# Patient Record
Sex: Female | Born: 1935 | Race: Black or African American | Hispanic: No | Marital: Single | Smoking: Former smoker
Health system: Southern US, Community
[De-identification: ages and names within clinical notes are randomized; demographics above are authoritative.]

## PROBLEM LIST (undated history)

## (undated) DIAGNOSIS — H9190 Unspecified hearing loss, unspecified ear: Secondary | ICD-10-CM

## (undated) DIAGNOSIS — N189 Chronic kidney disease, unspecified: Secondary | ICD-10-CM

## (undated) DIAGNOSIS — E785 Hyperlipidemia, unspecified: Secondary | ICD-10-CM

## (undated) DIAGNOSIS — I10 Essential (primary) hypertension: Secondary | ICD-10-CM

## (undated) DIAGNOSIS — M1990 Unspecified osteoarthritis, unspecified site: Secondary | ICD-10-CM

## (undated) DIAGNOSIS — A539 Syphilis, unspecified: Secondary | ICD-10-CM

## (undated) DIAGNOSIS — D649 Anemia, unspecified: Secondary | ICD-10-CM

## (undated) HISTORY — PX: OOPHORECTOMY: SHX86

## (undated) HISTORY — DX: Unspecified hearing loss, unspecified ear: H91.90

## (undated) HISTORY — DX: Syphilis, unspecified: A53.9

## (undated) HISTORY — DX: Chronic kidney disease, unspecified: N18.9

## (undated) HISTORY — DX: Anemia, unspecified: D64.9

## (undated) HISTORY — PX: NEPHRECTOMY: SHX65

## (undated) HISTORY — DX: Hyperlipidemia, unspecified: E78.5

## (undated) HISTORY — PX: ABDOMINAL HYSTERECTOMY: SHX81

## (undated) HISTORY — DX: Unspecified osteoarthritis, unspecified site: M19.90

## (undated) HISTORY — PX: CATARACT EXTRACTION: SUR2

---

## 1998-08-07 ENCOUNTER — Encounter: Admission: RE | Admit: 1998-08-07 | Discharge: 1998-11-05 | Payer: Self-pay | Admitting: Internal Medicine

## 2000-04-11 HISTORY — PX: OTHER SURGICAL HISTORY: SHX169

## 2000-07-14 ENCOUNTER — Encounter: Admission: RE | Admit: 2000-07-14 | Discharge: 2000-10-12 | Payer: Self-pay | Admitting: Internal Medicine

## 2003-08-29 ENCOUNTER — Emergency Department (HOSPITAL_COMMUNITY): Admission: EM | Admit: 2003-08-29 | Discharge: 2003-08-29 | Payer: Self-pay | Admitting: Emergency Medicine

## 2004-07-14 ENCOUNTER — Ambulatory Visit: Payer: Self-pay | Admitting: Internal Medicine

## 2004-08-13 ENCOUNTER — Ambulatory Visit: Payer: Self-pay | Admitting: Internal Medicine

## 2004-08-19 ENCOUNTER — Ambulatory Visit: Payer: Self-pay | Admitting: Internal Medicine

## 2004-09-09 ENCOUNTER — Ambulatory Visit: Payer: Self-pay | Admitting: Internal Medicine

## 2004-12-10 ENCOUNTER — Ambulatory Visit: Payer: Self-pay | Admitting: Internal Medicine

## 2005-04-21 ENCOUNTER — Ambulatory Visit: Payer: Self-pay | Admitting: Internal Medicine

## 2005-05-12 ENCOUNTER — Ambulatory Visit: Payer: Self-pay | Admitting: Internal Medicine

## 2005-07-01 ENCOUNTER — Encounter: Admission: RE | Admit: 2005-07-01 | Discharge: 2005-07-01 | Payer: Self-pay | Admitting: Internal Medicine

## 2005-07-05 ENCOUNTER — Encounter: Payer: Self-pay | Admitting: Internal Medicine

## 2005-08-09 ENCOUNTER — Ambulatory Visit: Payer: Self-pay | Admitting: Internal Medicine

## 2005-11-09 ENCOUNTER — Ambulatory Visit: Payer: Self-pay | Admitting: Internal Medicine

## 2006-03-09 ENCOUNTER — Ambulatory Visit: Payer: Self-pay | Admitting: Internal Medicine

## 2006-03-09 LAB — CONVERTED CEMR LAB
ALT: 13 units/L (ref 0–40)
AST: 19 units/L (ref 0–37)
BUN: 32 mg/dL — ABNORMAL HIGH (ref 6–23)
CO2: 26 meq/L (ref 19–32)
Calcium: 8.9 mg/dL (ref 8.4–10.5)
Chloride: 105 meq/L (ref 96–112)
Creatinine, Ser: 2.5 mg/dL — ABNORMAL HIGH (ref 0.4–1.2)
Ferritin: 29.7 ng/mL (ref 10.0–291.0)
GFR calc non Af Amer: 20 mL/min
Glomerular Filtration Rate, Af Am: 24 mL/min/{1.73_m2}
Glucose, Bld: 109 mg/dL — ABNORMAL HIGH (ref 70–99)
HCT: 25.5 % — ABNORMAL LOW (ref 36.0–46.0)
Hemoglobin: 8.3 g/dL — ABNORMAL LOW (ref 12.0–15.0)
Hgb A1c MFr Bld: 7.4 % — ABNORMAL HIGH (ref 4.6–6.0)
Iron: 53 ug/dL (ref 42–145)
MCHC: 32.7 g/dL (ref 30.0–36.0)
MCV: 86.3 fL (ref 78.0–100.0)
Platelets: 304 10*3/uL (ref 150–400)
Potassium: 3.9 meq/L (ref 3.5–5.1)
RBC: 2.95 M/uL — ABNORMAL LOW (ref 3.87–5.11)
RDW: 13.3 % (ref 11.5–14.6)
Sodium: 139 meq/L (ref 135–145)
WBC: 3.5 10*3/uL — ABNORMAL LOW (ref 4.5–10.5)

## 2006-06-26 ENCOUNTER — Ambulatory Visit: Payer: Self-pay | Admitting: Internal Medicine

## 2006-06-26 LAB — CONVERTED CEMR LAB
BUN: 36 mg/dL — ABNORMAL HIGH (ref 6–23)
Basophils Absolute: 0 10*3/uL (ref 0.0–0.1)
Basophils Relative: 0.6 % (ref 0.0–1.0)
CO2: 25 meq/L (ref 19–32)
Calcium: 8.8 mg/dL (ref 8.4–10.5)
Chloride: 109 meq/L (ref 96–112)
Cholesterol: 131 mg/dL (ref 0–200)
Creatinine, Ser: 2.5 mg/dL — ABNORMAL HIGH (ref 0.4–1.2)
Eosinophils Absolute: 0.1 10*3/uL (ref 0.0–0.6)
Eosinophils Relative: 2.5 % (ref 0.0–5.0)
GFR calc Af Amer: 24 mL/min
GFR calc non Af Amer: 20 mL/min
Glucose, Bld: 59 mg/dL — ABNORMAL LOW (ref 70–99)
HCT: 24.8 % — ABNORMAL LOW (ref 36.0–46.0)
HDL: 70.8 mg/dL (ref >39.0)
Hemoglobin: 8.2 g/dL — ABNORMAL LOW (ref 12.0–15.0)
Hgb A1c MFr Bld: 7.2 % — ABNORMAL HIGH (ref 4.6–6.0)
LDL Cholesterol: 50 mg/dL (ref 0–99)
Lymphocytes Relative: 26.7 % (ref 12.0–46.0)
MCHC: 33 g/dL (ref 30.0–36.0)
MCV: 84.9 fL (ref 78.0–100.0)
Monocytes Absolute: 0.5 10*3/uL (ref 0.2–0.7)
Monocytes Relative: 14.9 % — ABNORMAL HIGH (ref 3.0–11.0)
Neutro Abs: 1.9 10*3/uL (ref 1.4–7.7)
Neutrophils Relative %: 55.3 % (ref 43.0–77.0)
Platelets: 280 10*3/uL (ref 150–400)
Potassium: 5.3 meq/L — ABNORMAL HIGH (ref 3.5–5.1)
RBC: 2.92 M/uL — ABNORMAL LOW (ref 3.87–5.11)
RDW: 12.9 % (ref 11.5–14.6)
Sodium: 141 meq/L (ref 135–145)
Total CHOL/HDL Ratio: 1.9
Triglycerides: 52 mg/dL (ref 0–149)
VLDL: 10 mg/dL (ref 0–40)
WBC: 3.4 10*3/uL — ABNORMAL LOW (ref 4.5–10.5)

## 2006-08-17 DIAGNOSIS — E119 Type 2 diabetes mellitus without complications: Secondary | ICD-10-CM

## 2006-08-17 DIAGNOSIS — D649 Anemia, unspecified: Secondary | ICD-10-CM

## 2006-08-17 DIAGNOSIS — E785 Hyperlipidemia, unspecified: Secondary | ICD-10-CM

## 2006-09-22 ENCOUNTER — Ambulatory Visit: Payer: Self-pay | Admitting: Vascular Surgery

## 2006-09-22 ENCOUNTER — Emergency Department (HOSPITAL_COMMUNITY): Admission: EM | Admit: 2006-09-22 | Discharge: 2006-09-22 | Payer: Self-pay | Admitting: Emergency Medicine

## 2006-09-22 ENCOUNTER — Telehealth: Payer: Self-pay | Admitting: Internal Medicine

## 2006-09-29 ENCOUNTER — Telehealth: Payer: Self-pay | Admitting: Internal Medicine

## 2006-09-29 ENCOUNTER — Encounter: Payer: Self-pay | Admitting: Internal Medicine

## 2006-09-30 ENCOUNTER — Ambulatory Visit: Payer: Self-pay | Admitting: Family Medicine

## 2006-09-30 ENCOUNTER — Encounter: Payer: Self-pay | Admitting: Internal Medicine

## 2006-10-12 ENCOUNTER — Ambulatory Visit: Payer: Self-pay | Admitting: Internal Medicine

## 2006-10-12 DIAGNOSIS — N184 Chronic kidney disease, stage 4 (severe): Secondary | ICD-10-CM | POA: Insufficient documentation

## 2006-10-15 LAB — CONVERTED CEMR LAB
BUN: 31 mg/dL — ABNORMAL HIGH (ref 6–23)
Basophils Absolute: 0 10*3/uL (ref 0.0–0.1)
Basophils Relative: 0.3 % (ref 0.0–1.0)
CO2: 24 meq/L (ref 19–32)
Calcium: 8.7 mg/dL (ref 8.4–10.5)
Chloride: 108 meq/L (ref 96–112)
Creatinine, Ser: 2.3 mg/dL — ABNORMAL HIGH (ref 0.4–1.2)
Eosinophils Absolute: 0.1 10*3/uL (ref 0.0–0.6)
Eosinophils Relative: 1.2 % (ref 0.0–5.0)
GFR calc Af Amer: 26 mL/min
GFR calc non Af Amer: 22 mL/min
Glucose, Bld: 75 mg/dL (ref 70–99)
HCT: 24.3 % — ABNORMAL LOW (ref 36.0–46.0)
Hemoglobin: 7.9 g/dL — ABNORMAL LOW (ref 12.0–15.0)
Lymphocytes Relative: 26.6 % (ref 12.0–46.0)
MCHC: 32.6 g/dL (ref 30.0–36.0)
MCV: 84.6 fL (ref 78.0–100.0)
Monocytes Absolute: 0.6 10*3/uL (ref 0.2–0.7)
Monocytes Relative: 10 % (ref 3.0–11.0)
Neutro Abs: 3.7 10*3/uL (ref 1.4–7.7)
Neutrophils Relative %: 61.9 % (ref 43.0–77.0)
Platelets: 274 10*3/uL (ref 150–400)
Potassium: 5 meq/L (ref 3.5–5.1)
RBC: 2.88 M/uL — ABNORMAL LOW (ref 3.87–5.11)
RDW: 13.5 % (ref 11.5–14.6)
Sodium: 140 meq/L (ref 135–145)
WBC: 6 10*3/uL (ref 4.5–10.5)

## 2006-10-20 ENCOUNTER — Telehealth (INDEPENDENT_AMBULATORY_CARE_PROVIDER_SITE_OTHER): Payer: Self-pay | Admitting: *Deleted

## 2006-10-20 ENCOUNTER — Encounter (INDEPENDENT_AMBULATORY_CARE_PROVIDER_SITE_OTHER): Payer: Self-pay | Admitting: *Deleted

## 2007-02-09 ENCOUNTER — Encounter (INDEPENDENT_AMBULATORY_CARE_PROVIDER_SITE_OTHER): Payer: Self-pay | Admitting: *Deleted

## 2007-03-16 ENCOUNTER — Ambulatory Visit: Payer: Self-pay | Admitting: Internal Medicine

## 2007-03-29 LAB — CONVERTED CEMR LAB
GFR calc Af Amer: 23 mL/min
GFR calc non Af Amer: 19 mL/min
Glucose, Bld: 262 mg/dL — ABNORMAL HIGH (ref 70–99)
Hemoglobin: 9.1 g/dL — ABNORMAL LOW (ref 12.0–15.0)
Iron: 77 ug/dL (ref 42–145)
Sodium: 137 meq/L (ref 135–145)

## 2007-05-04 ENCOUNTER — Ambulatory Visit: Payer: Self-pay | Admitting: Internal Medicine

## 2007-05-08 LAB — CONVERTED CEMR LAB
Calcium: 9 mg/dL (ref 8.4–10.5)
Chloride: 107 meq/L (ref 96–112)
GFR calc non Af Amer: 22 mL/min
Sodium: 139 meq/L (ref 135–145)

## 2007-08-27 ENCOUNTER — Encounter (INDEPENDENT_AMBULATORY_CARE_PROVIDER_SITE_OTHER): Payer: Self-pay | Admitting: *Deleted

## 2007-09-12 ENCOUNTER — Telehealth (INDEPENDENT_AMBULATORY_CARE_PROVIDER_SITE_OTHER): Payer: Self-pay | Admitting: *Deleted

## 2007-11-23 ENCOUNTER — Ambulatory Visit: Payer: Self-pay | Admitting: Internal Medicine

## 2007-12-07 ENCOUNTER — Telehealth (INDEPENDENT_AMBULATORY_CARE_PROVIDER_SITE_OTHER): Payer: Self-pay | Admitting: *Deleted

## 2007-12-07 LAB — CONVERTED CEMR LAB
ALT: 10 units/L (ref 0–35)
AST: 17 units/L (ref 0–37)
BUN: 30 mg/dL — ABNORMAL HIGH (ref 6–23)
Cholesterol: 150 mg/dL (ref 0–200)
GFR calc Af Amer: 29 mL/min
Glucose, Bld: 197 mg/dL — ABNORMAL HIGH (ref 70–99)
HDL: 83.6 mg/dL (ref >39.0)
Hemoglobin: 9.8 g/dL — ABNORMAL LOW (ref 12.0–15.0)
Microalb Creat Ratio: 286.7 mg/g — ABNORMAL HIGH (ref 0.0–30.0)
Potassium: 4.7 meq/L (ref 3.5–5.1)
VLDL: 14 mg/dL (ref 0–40)

## 2008-01-25 ENCOUNTER — Telehealth (INDEPENDENT_AMBULATORY_CARE_PROVIDER_SITE_OTHER): Payer: Self-pay | Admitting: *Deleted

## 2008-02-18 ENCOUNTER — Ambulatory Visit: Payer: Self-pay | Admitting: Internal Medicine

## 2008-02-18 DIAGNOSIS — M1990 Unspecified osteoarthritis, unspecified site: Secondary | ICD-10-CM | POA: Insufficient documentation

## 2008-02-26 ENCOUNTER — Telehealth: Payer: Self-pay | Admitting: Internal Medicine

## 2008-06-20 ENCOUNTER — Ambulatory Visit: Payer: Self-pay | Admitting: Internal Medicine

## 2008-06-21 LAB — CONVERTED CEMR LAB
Calcium: 8.9 mg/dL (ref 8.4–10.5)
Chloride: 105 meq/L (ref 96–112)
Creatinine, Ser: 2.4 mg/dL — ABNORMAL HIGH (ref 0.4–1.2)
GFR calc Af Amer: 25 mL/min
GFR calc non Af Amer: 21 mL/min
Hgb A1c MFr Bld: 7.8 % — ABNORMAL HIGH (ref 4.6–6.0)

## 2008-06-24 ENCOUNTER — Telehealth (INDEPENDENT_AMBULATORY_CARE_PROVIDER_SITE_OTHER): Payer: Self-pay | Admitting: *Deleted

## 2008-11-28 ENCOUNTER — Ambulatory Visit: Payer: Self-pay | Admitting: Internal Medicine

## 2008-12-04 ENCOUNTER — Encounter (INDEPENDENT_AMBULATORY_CARE_PROVIDER_SITE_OTHER): Payer: Self-pay | Admitting: *Deleted

## 2008-12-04 LAB — CONVERTED CEMR LAB
BUN: 24 mg/dL — ABNORMAL HIGH (ref 6–23)
Calcium: 8.9 mg/dL (ref 8.4–10.5)
Creatinine, Ser: 1.9 mg/dL — ABNORMAL HIGH (ref 0.4–1.2)
GFR calc non Af Amer: 32.44 mL/min (ref >60)
Potassium: 4.7 meq/L (ref 3.5–5.1)
TSH: 1.85 microintl units/mL (ref 0.35–5.50)

## 2009-02-03 ENCOUNTER — Encounter (INDEPENDENT_AMBULATORY_CARE_PROVIDER_SITE_OTHER): Payer: Self-pay | Admitting: *Deleted

## 2009-03-12 ENCOUNTER — Encounter (INDEPENDENT_AMBULATORY_CARE_PROVIDER_SITE_OTHER): Payer: Self-pay | Admitting: *Deleted

## 2009-04-15 ENCOUNTER — Ambulatory Visit: Payer: Self-pay | Admitting: Internal Medicine

## 2009-04-20 ENCOUNTER — Encounter (INDEPENDENT_AMBULATORY_CARE_PROVIDER_SITE_OTHER): Payer: Self-pay | Admitting: *Deleted

## 2009-04-20 LAB — CONVERTED CEMR LAB
HDL: 93 mg/dL (ref >39.00)
LDL Cholesterol: 85 mg/dL (ref 0–99)
Total CHOL/HDL Ratio: 2
Triglycerides: 91 mg/dL (ref 0.0–149.0)
VLDL: 18.2 mg/dL (ref 0.0–40.0)

## 2009-08-13 ENCOUNTER — Ambulatory Visit: Payer: Self-pay | Admitting: Internal Medicine

## 2009-08-17 ENCOUNTER — Telehealth (INDEPENDENT_AMBULATORY_CARE_PROVIDER_SITE_OTHER): Payer: Self-pay | Admitting: *Deleted

## 2009-08-17 LAB — CONVERTED CEMR LAB
BUN: 38 mg/dL — ABNORMAL HIGH (ref 6–23)
Basophils Relative: 0.9 % (ref 0.0–3.0)
Creatinine, Ser: 2.3 mg/dL — ABNORMAL HIGH (ref 0.4–1.2)
Eosinophils Absolute: 0.1 10*3/uL (ref 0.0–0.7)
Eosinophils Relative: 2 % (ref 0.0–5.0)
GFR calc non Af Amer: 26.65 mL/min (ref >60)
Lymphocytes Relative: 25.4 % (ref 12.0–46.0)
Monocytes Absolute: 0.7 10*3/uL (ref 0.1–1.0)
Neutrophils Relative %: 60.1 % (ref 43.0–77.0)
Platelets: 315 10*3/uL (ref 150.0–400.0)
RBC: 3.62 M/uL — ABNORMAL LOW (ref 3.87–5.11)
Sodium: 141 meq/L (ref 135–145)
WBC: 5.8 10*3/uL (ref 4.5–10.5)

## 2009-08-25 ENCOUNTER — Encounter (INDEPENDENT_AMBULATORY_CARE_PROVIDER_SITE_OTHER): Payer: Self-pay | Admitting: *Deleted

## 2009-12-16 ENCOUNTER — Ambulatory Visit: Payer: Self-pay | Admitting: Internal Medicine

## 2009-12-18 LAB — CONVERTED CEMR LAB
ALT: 12 units/L (ref 0–35)
Calcium: 8.9 mg/dL (ref 8.4–10.5)
GFR calc non Af Amer: 28.67 mL/min (ref >60)
Glucose, Bld: 153 mg/dL — ABNORMAL HIGH (ref 70–99)
Hgb A1c MFr Bld: 9 % — ABNORMAL HIGH (ref 4.6–6.5)
Potassium: 5 meq/L (ref 3.5–5.1)
Sodium: 141 meq/L (ref 135–145)

## 2009-12-23 ENCOUNTER — Telehealth (INDEPENDENT_AMBULATORY_CARE_PROVIDER_SITE_OTHER): Payer: Self-pay | Admitting: *Deleted

## 2010-01-11 ENCOUNTER — Encounter: Payer: Self-pay | Admitting: Internal Medicine

## 2010-04-16 ENCOUNTER — Ambulatory Visit
Admission: RE | Admit: 2010-04-16 | Discharge: 2010-04-16 | Payer: Self-pay | Source: Home / Self Care | Attending: Internal Medicine | Admitting: Internal Medicine

## 2010-04-16 ENCOUNTER — Other Ambulatory Visit: Payer: Self-pay | Admitting: Internal Medicine

## 2010-04-16 LAB — LIPID PANEL
Cholesterol: 174 mg/dL (ref 0–200)
HDL: 73.3 mg/dL (ref >39.00)
LDL Cholesterol: 78 mg/dL (ref 0–99)
Total CHOL/HDL Ratio: 2
Triglycerides: 116 mg/dL (ref 0.0–149.0)
VLDL: 23.2 mg/dL (ref 0.0–40.0)

## 2010-04-16 LAB — MICROALBUMIN / CREATININE URINE RATIO
Creatinine,U: 78.7 mg/dL
Microalb Creat Ratio: 9.4 mg/g (ref 0.0–30.0)
Microalb, Ur: 7.4 mg/dL — ABNORMAL HIGH (ref 0.0–1.9)

## 2010-04-16 LAB — HEMOGLOBIN: Hemoglobin: 10.6 g/dL — ABNORMAL LOW (ref 12.0–15.0)

## 2010-04-16 LAB — HEMOGLOBIN A1C: Hgb A1c MFr Bld: 8.7 % — ABNORMAL HIGH (ref 4.6–6.5)

## 2010-04-27 ENCOUNTER — Telehealth: Payer: Self-pay | Admitting: Internal Medicine

## 2010-05-11 NOTE — Progress Notes (Signed)
  Phone Note Call from Patient   Summary of Call: Needs Test Strips and Lancets sent in for OneTouch machine.     New/Updated Medications: ONETOUCH ULTRA BLUE  STRP (GLUCOSE BLOOD) as directed. ONETOUCH ULTRASOFT LANCETS  MISC (LANCETS) as directed. Prescriptions: ONETOUCH ULTRASOFT LANCETS  MISC (LANCETS) as directed.  #100 x 1   Entered by:   Army Fossa CMA   Authorized by:   Nolon Rod. Paz MD   Signed by:   Army Fossa CMA on 12/23/2009   Method used:   [facility]        [facility] [address] [phone]* (retail)       [address]       [place], [place]  [address]       Ph: [phone] or [phone]       Fax: [phone]   RxID:   [id number] ONETOUCH ULTRA BLUE  STRP (GLUCOSE BLOOD) as directed.  #100 x 0   Entered by:   Army Fossa CMA   Authorized by:   Nolon Rod. Paz MD   Signed by:   Army Fossa CMA on 12/23/2009   Method used:   [facility]        [facility] [address] [phone]* (retail)       [address]       [place], [place]  [address]       Ph: [phone] or [phone]       Fax: [phone]   RxID:   [id number]

## 2010-05-11 NOTE — Progress Notes (Signed)
SummaryMarita Barker, results, 5/9, 5/12, 5/17  Phone Note Outgoing Call Call back at [name] Phone [phone]   Call placed by: Shary Decamp,  Aug 17, 2009 4:55 PM Call placed to: Patient Reason for Call: Discuss lab or test results Details for Reason:  - renal fx - stable  - anemia - stable  - DM needs better control - goal <8  - increase glimepiride 2mg  to 1 1/2 tab daily  - watch for low blood sugar sxs  - f/u in 4 months  Summary of Call: left message on machine for daughter to return call.............Marland KitchenShary Decamp  Aug 17, 2009 4:56 PM left message on machine for daughter to return call........Marland KitchenShary Decamp  Aug 20, 2009 11:59 AM left message on machine for daughter to return call.......Marland KitchenShary Decamp  Aug 25, 2009 2:34 PM letter mailed........Marland KitchenShary Decamp  Aug 25, 2009 2:35 PM      New/Updated Medications: GLIMEPIRIDE 2 MG TABS (GLIMEPIRIDE) 1 1/2 by mouth once daily Prescriptions: GLIMEPIRIDE 2 MG TABS (GLIMEPIRIDE) 1 1/2 by mouth once daily  #45 x 3   Entered by:   Shary Decamp   Authorized by:   Nolon Rod. Paz MD   Signed by:   Shary Decamp on 08/17/2009   Method used:   Electronically to        [facility] [address] [phone]* (retail)       [address]       [place], [place]  [address]       Ph: [phone] or [phone]       Fax: [phone]   RxID:   [id number]

## 2010-05-11 NOTE — Letter (Signed)
Summary: LMOM, results, 5/9, 5/12, 5/17   [facility] [place] [address] [place], [place]  [address] Phone: [phone]      Aug 25, 2009   [facility] [address] [place], [place] [address]  RE:  LAB RESULTS  Dear  Ms. Meiklejohn,  The following is an interpretation of your most recent lab tests.  Please take note of any instructions provided or changes to medications that have resulted from your lab work.  ELECTROLYTES:  Stable - no changes needed  KIDNEY FUNCTION TESTS:  Stable - no changes needed    DIABETIC STUDIES:  Please see the enclosed Rx for a change in medication Blood Glucose: 188   HgbA1C: 8.8   Microalbumin/Creatinine Ratio: 286.7     Diabetes needs better control.  Please increase glimpiride to 1 1/2 tablet daily ([facility] [address]).  Watch for low blood sugar symptoms.  Follow up with Dr. Drue Novel in 4 months.  Please call our office if you have any questions. Alena Bills

## 2010-05-11 NOTE — Assessment & Plan Note (Signed)
Summary: FOLLOWUP PER LETTER////SPH   Vital Signs:  Patient profile:   77 year old female Weight:      163.8 pounds Pulse rate:   80 / minute BP sitting:   120 / 80  Vitals Entered By: Shary Decamp (April 15, 2009 11:34 AM) CC: rov   History of Present Illness: here w/ a daughter for f/u "I feel terrific"   Current Medications (verified): 1)  Clonidine Hcl 0.1 Mg Tabs (Clonidine Hcl) .... Take 1 Tablet By Mouth Twice A Day 2)  Furosemide 20 Mg Tabs (Furosemide) .... Take 2 Tablet By Mouth Once A Day- 3)  Vitamin B12 4)  Asa 5)  Glimepiride 2 Mg Tabs (Glimepiride) .Marland Kitchen.. 1 By Mouth Once Daily 6)  Januvia 50 Mg Tabs (Sitagliptin Phosphate) .Marland Kitchen.. 1 By Mouth Once Daily 7)  Simvastatin 40 Mg Tabs (Simvastatin) .Marland Kitchen.. 1 A Day  Allergies (verified): 1)  ! Lisinopril 2)  ! 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  syphillis, treated c/ PCN 1993 Osteoarthritis  Social History: Reviewed history from 06/20/2008 and no changes required. lives in her house w/ a son still works 4h a day (cleaning)  Review of Systems       Diabetes -- CBGs in the 105 range when checked  Hyperlipidemia-- good medication compliance   denies  s/e from medications denies runny nose (see last OV) CV:  Denies chest pain or discomfort, shortness of breath with exertion, and swelling of feet.  Physical Exam  General:  alert and well-developed.   Lungs:  normal respiratory effort, no intercostal retractions, no accessory muscle use, and normal breath sounds.   Heart:  normal rate, regular rhythm, and no murmur.   Extremities:  no pretibial edema bilaterally  R knee on a brace, no edema or warmness   Impression & Recommendations:  Problem # 1:  HYPERLIPIDEMIA (ICD-272.4) tolerating meds well  Her updated medication list for this problem  includes:    Simvastatin 40 Mg Tabs (Simvastatin) .Marland Kitchen... 1 a day  Orders: TLB-ALT (SGPT) (84460-ALT) TLB-AST (SGOT) (84450-SGOT) TLB-Lipid Panel (80061-LIPID)  Labs Reviewed: SGOT: 17 (11/23/2007)   SGPT: 10 (11/23/2007)   HDL:83.6 (11/23/2007), 70.8 (06/26/2006)  LDL:52 (11/23/2007), 50 (06/26/2006)  Chol:150 (11/23/2007), 131 (06/26/2006)  Trig:72 (11/23/2007), 52 (06/26/2006)  Problem # 2:  DIABETES MELLITUS, TYPE II (ICD-250.00) on meds , doing well labs  not on ACEs despite (+) microalb,h/o hyperkalemia w/ lisinopril ----> BP wnl, no change  Her updated medication list for this problem includes:    Glimepiride 2 Mg Tabs (Glimepiride) .Marland Kitchen... 1 by mouth once daily    Januvia 50 Mg Tabs (Sitagliptin phosphate) .Marland Kitchen... 1 by mouth once daily  Orders: Venipuncture ([address]) TLB-ALT (SGPT) (84460-ALT) TLB-AST (SGOT) (84450-SGOT) TLB-A1C / Hgb A1C (Glycohemoglobin) (83036-A1C)  Labs Reviewed: Creat: 1.9 (11/28/2008)    Reviewed HgBA1c results: 7.3 (11/28/2008)  7.8 (06/20/2008)  Complete Medication List: 1)  Clonidine Hcl 0.1 Mg Tabs (Clonidine hcl) .... Take 1 tablet by mouth twice a day 2)  Furosemide 20 Mg Tabs (Furosemide) .... Take 2 tablet by mouth once a day- 3)  Vitamin B12  4)  Asa  5)  Glimepiride 2 Mg Tabs (Glimepiride) .Marland Kitchen.. 1 by mouth once daily 6)  Januvia 50 Mg Tabs (Sitagliptin phosphate) .Marland Kitchen.. 1 by mouth once daily 7)  Simvastatin 40 Mg Tabs (Simvastatin) .Marland Kitchen.. 1 a day  Patient Instructions: 1)  Please  schedule a follow-up appointment in 4 months .  Prescriptions: SIMVASTATIN 40 MG TABS (SIMVASTATIN) 1 a day  #90 x 1   Entered by:   Shary Decamp   Authorized by:   Nolon Rod. Maddox Bratcher MD   Signed by:   Shary Decamp on 04/15/2009   Method used:   Print then Give to Patient   RxID:   [id number] JANUVIA 50 MG TABS (SITAGLIPTIN PHOSPHATE) 1 by mouth once daily  #90 x 1   Entered by:   Shary Decamp   Authorized by:   Nolon Rod. Acheron Sugg MD   Signed by:   Shary Decamp on  04/15/2009   Method used:   Print then Give to Patient   RxID:   [id number] GLIMEPIRIDE 2 MG TABS (GLIMEPIRIDE) 1 by mouth once daily  #90 x 1   Entered by:   Shary Decamp   Authorized by:   Nolon Rod. Tashe Purdon MD   Signed by:   Shary Decamp on 04/15/2009   Method used:   Print then Give to Patient   RxID:   [id number] FUROSEMIDE 20 MG TABS (FUROSEMIDE) Take 2 tablet by mouth once a day-  #180.0 Each x 1   Entered by:   Shary Decamp   Authorized by:   Nolon Rod. Ruey Storer MD   Signed by:   Shary Decamp on 04/15/2009   Method used:   Print then Give to Patient   RxID:   [id number] CLONIDINE HCL 0.1 MG TABS (CLONIDINE HCL) Take 1 tablet by mouth twice a day  #180.0 Each x 1   Entered by:   Shary Decamp   Authorized by:   Nolon Rod. Shakevia Sarris MD   Signed by:   Shary Decamp on 04/15/2009   Method used:   Print then Give to Patient   RxID:   [id number]

## 2010-05-11 NOTE — Assessment & Plan Note (Signed)
Summary: 4 month roa//lch   Vital Signs:  Patient profile:   77 year old female Weight:      154.25 pounds Pulse rate:   65 / minute Pulse rhythm:   regular BP sitting:   130 / 84  (left arm) Cuff size:   large  Vitals Entered By: Army Fossa CMA (December 16, 2009 9:29 AM) CC: Follow up for- fasting Comments Pharm- [facility]   History of Present Illness: ROV chart reviewed in reference to screenings, see below  Diabetes -- no ambulatory CBGs  Hyperlipidemia-- good medication compliance  Osteoarthritis--  R knee stiffnes > pain x a while, patient wonders if related to medicines; denies myalgias perse     Current Medications (verified): 1)  Clonidine Hcl 0.1 Mg Tabs (Clonidine Hcl) .... Take 1 Tablet By Mouth Twice A Day 2)  Furosemide 20 Mg Tabs (Furosemide) .... Take 2 Tablet By Mouth Once A Day- 3)  Vitamin B12 4)  Asa 5)  Glimepiride 2 Mg Tabs (Glimepiride) .Marland Kitchen.. 1 1/2 By Mouth Once Daily 6)  Januvia 50 Mg Tabs (Sitagliptin Phosphate) .Marland Kitchen.. 1 By Mouth Once Daily 7)  Simvastatin 40 Mg Tabs (Simvastatin) .Marland Kitchen.. 1 A Day  Allergies: 1)  ! Lisinopril 2)  ! 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  syphillis, treated c/ PCN 1993 Osteoarthritis  Past Surgical History: Reviewed history from 11/23/2007 and no changes required. 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 Resp:  Denies cough and shortness of breath. GI:  Denies bloody stools, nausea, and vomiting.  Physical Exam  General:  alert and  well-developed.   Lungs:  normal respiratory effort, no intercostal retractions, no accessory muscle use, and normal breath sounds.   Heart:  normal rate, regular rhythm, and no murmur.   Extremities:  no lower extremity edema Knee deformities consistent with DJD No effusion, redness, warmness    Impression & Recommendations:  Problem # 1:  OSTEOARTHRITIS (ICD-715.90) not taking any pain meds at present , rec tylenol, she does not seem to like (?need) any pain med  offered referal to ortho  Her updated medication list for this problem includes:    Tylenol Extra Strength 500 Mg Tabs (Acetaminophen) .Marland Kitchen... 1 or 2 tabs every 6 hours as needed  Problem # 2:  HEALTH SCREENING (ICD-V70.0) TD 03 Pneumonia shot 2003  (per guidelines, no further pneumonia shots) had a flu shot at the pharmacy shingles shot discussed    flex-sig 1997, not interested in a Cscope has consistently refused MMG, refuses again today declined PAPs as well  explained benefits of screening to patient and daughter    Problem # 3:  HYPERLIPIDEMIA (ICD-272.4) cholesterol well controlled, given renal insufficiency, will decrease dose of simvastatin to 20 Her updated medication list for this problem includes:    Simvastatin 40 Mg Tabs (Simvastatin) .Marland Kitchen... 1/2 tablet a day  Labs Reviewed: SGOT: 14 (04/15/2009)   SGPT: 10 (04/15/2009)   HDL:93.00 (04/15/2009), 83.6 (11/23/2007)  LDL:85 (04/15/2009), 52 (11/23/2007)  Chol:196 (04/15/2009), 150 (11/23/2007)  Trig:91.0 (04/15/2009), 72 (11/23/2007)  Orders: TLB-ALT (SGPT) (84460-ALT)  TLB-AST (SGOT) (84450-SGOT) Specimen Handling ([address])  Problem # 4:  DIABETES MELLITUS, TYPE II (ICD-250.00) based on the last A1c, Amaryl  was increased, reports good compliance Her updated medication list for this problem includes:    Glimepiride 2 Mg Tabs (Glimepiride) .Marland Kitchen... 1 1/2 by mouth once daily    Januvia 50 Mg Tabs (Sitagliptin phosphate) .Marland Kitchen... 1 by mouth once daily  Labs  Reviewed: Creat: 2.3 (08/13/2009)    Reviewed HgBA1c results: 8.8 (08/13/2009)  7.8 (04/15/2009)  Orders: TLB-A1C / Hgb A1C (Glycohemoglobin) (83036-A1C) Specimen Handling (98119)  Complete Medication List: 1)  Clonidine Hcl 0.1 Mg Tabs (Clonidine hcl) .... Take 1 tablet by mouth twice a day 2)  Furosemide 20 Mg Tabs (Furosemide) .... Take 2 tablet by mouth once a day- 3)  Vitamin B12  4)  Asa  5)  Glimepiride 2 Mg Tabs (Glimepiride) .Marland Kitchen.. 1 1/2 by mouth once daily 6)  Januvia 50 Mg Tabs (Sitagliptin phosphate) .Marland Kitchen.. 1 by mouth once daily 7)  Simvastatin 40 Mg Tabs (Simvastatin) .... 1/2 tablet a day 8)  Tylenol Extra Strength 500 Mg Tabs (Acetaminophen) .Marland Kitchen.. 1 or 2 tabs every 6 hours as needed  Other Orders: Venipuncture (14782) TLB-BMP (Basic Metabolic Panel-BMET) (80048-METABOL)  Patient Instructions: 1)  Please schedule a follow-up appointment in 4 months .    Influenza Immunization History:    Influenza # 1:  got it elsewhere (12/16/2009)

## 2010-05-11 NOTE — Letter (Signed)
Summary: CMN for Diabetic Supplies / [facility]  CMN for Diabetic Supplies / [facility]   Imported By: Lennie Odor 01/18/2010 14:57:50  _____________________________________________________________________  External Attachment:    Type:   Image     Comment:   External Document

## 2010-05-11 NOTE — Assessment & Plan Note (Signed)
Summary: 4 mth fu/ns/kdc   Vital Signs:  Patient profile:   77 year old female Weight:      161.8 pounds Pulse rate:   86 / minute BP sitting:   120 / 80  Vitals Entered By: Shary Decamp (Aug 13, 2009 9:23 AM) CC: rov, fasting   History of Present Illness: ROV , feels very well , here with one of her daughters   Current Medications (verified): 1)  Clonidine Hcl 0.1 Mg Tabs (Clonidine Hcl) .... Take 1 Tablet By Mouth Twice A Day 2)  Furosemide 20 Mg Tabs (Furosemide) .... Take 2 Tablet By Mouth Once A Day- 3)  Vitamin B12 4)  Asa 5)  Glimepiride 2 Mg Tabs (Glimepiride) .Marland Kitchen.. 1 By Mouth Once Daily 6)  Januvia 50 Mg Tabs (Sitagliptin Phosphate) .Marland Kitchen.. 1 By Mouth Once Daily 7)  Simvastatin 40 Mg Tabs (Simvastatin) .Marland Kitchen.. 1 A Day  Allergies (verified): 1)  ! Lisinopril 2)  ! 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  syphillis, treated c/ PCN 1993 Osteoarthritis  Past Surgical History: Reviewed history from 11/23/2007 and no changes required. rt cochlear implant 1/02 Hysterectomy--BSO Nephrectomy - lt Oophorectomy  Review of Systems       no LE edema (-) CP-SOB (-) N-V Diabetes-- no ambulatory CBGs  Hyperlipidemia-- good medication compliance , no myalgias  Osteoarthritis-- "stiff knee" but not very painful   Physical Exam  General:  alert and well-developed.   Lungs:  normal respiratory effort, no intercostal retractions, no accessory muscle use, and normal breath sounds.   Heart:  normal rate, regular rhythm, and no murmur.   Extremities:  no edema Neurologic:  alert , cooperative oriented to time, space and self   Impression & Recommendations:  Problem # 1:  RENAL INSUFFICIENCY, CHRONIC (ICD-585.9) due for labs Labs Reviewed: BUN: 24 (11/28/2008)   Cr: 1.9 (11/28/2008)    Hgb: 9.5 (11/28/2008)   Hct: 24.3  (10/12/2006)   Ca++: 8.9 (11/28/2008)     Orders: Venipuncture ([address]) TLB-BMP (Basic Metabolic Panel-BMET) (80048-METABOL)  Problem # 2:  DIABETES MELLITUS, TYPE II (ICD-250.00) due for labs not on ACEs despite (+) microalb,h/o hyperkalemia w/ lisinopril ----> BP wnl, no change    Her updated medication list for this problem includes:    Glimepiride 2 Mg Tabs (Glimepiride) .Marland Kitchen... 1 by mouth once daily    Januvia 50 Mg Tabs (Sitagliptin phosphate) .Marland Kitchen... 1 by mouth once daily  Orders: TLB-A1C / Hgb A1C (Glycohemoglobin) (83036-A1C)  Labs Reviewed: Creat: 1.9 (11/28/2008)    Reviewed HgBA1c results: 7.8 (04/15/2009)  7.3 (11/28/2008)  Problem # 3:  ANEMIA-NOS (ICD-285.9) anemia thought to be related to chronic dz, has refused to go back to nephrology or have EPO shots Orders: TLB-CBC Platelet - w/Differential (85025-CBCD)   Hgb: 9.8 (11/23/2007)   Hct: 24.3 (10/12/2006)   Platelets: 274 (10/12/2006) RBC: 2.88 (10/12/2006)   RDW: 13.5 (10/12/2006)   WBC: 6.0 (10/12/2006) MCV: 84.6 (10/12/2006)   MCHC: 32.6 (10/12/2006) Ferritin: 29.7 (03/09/2006) Iron: 77 (03/16/2007)     Orders: TLB-Hemoglobin (Hgb) (85018-HGB)  Complete Medication List: 1)  Clonidine Hcl 0.1 Mg Tabs (Clonidine hcl) .... Take 1 tablet by mouth twice a day 2)  Furosemide 20 Mg Tabs (Furosemide) .... Take 2 tablet by mouth once a day- 3)  Vitamin B12  4)  Asa  5)  Glimepiride 2 Mg Tabs (Glimepiride) .Marland Kitchen.. 1 by mouth  once daily 6)  Januvia 50 Mg Tabs (Sitagliptin phosphate) .Marland Kitchen.. 1 by mouth once daily 7)  Simvastatin 40 Mg Tabs (Simvastatin) .Marland Kitchen.. 1 a day  Patient Instructions: 1)  Please schedule a follow-up appointment in 4 months .  Prescriptions: SIMVASTATIN 40 MG TABS (SIMVASTATIN) 1 a day  #90.0 Each x 1   Entered by:   Shary Decamp   Authorized by:   Nolon Rod. Debbrah Sampedro MD   Signed by:   Shary Decamp on 08/13/2009   Method used:   Electronically to        [facility] [address] [phone]* (retail)        [address]       [address]       [place], [place]  [address]       Ph: [phone]       Fax: [phone]   RxID:   [id number] JANUVIA 50 MG TABS (SITAGLIPTIN PHOSPHATE) 1 by mouth once daily  #90.0 Each x 1   Entered by:   Shary Decamp   Authorized by:   Nolon Rod. Lyonel Morejon MD   Signed by:   Shary Decamp on 08/13/2009   Method used:   Electronically to        [facility] [address] [phone]* (retail)       [address]       [address]       [place], [place]  [address]       Ph: [phone]       Fax: [phone]   RxID:   [id number] GLIMEPIRIDE 2 MG TABS (GLIMEPIRIDE) 1 by mouth once daily  #90 x 1   Entered by:   Shary Decamp   Authorized by:   Nolon Rod. Elizebeth Kluesner MD   Signed by:   Shary Decamp on 08/13/2009   Method used:   Electronically to        [facility] [address] [phone]* (retail)       [address]       [address]       [place], [place]  [address]       Ph: [phone]       Fax: [phone]   RxID:   [id number] FUROSEMIDE 20 MG TABS (FUROSEMIDE) Take 2 tablet by mouth once a day-  #180.0 Each x 1   Entered by:   Shary Decamp   Authorized by:   Nolon Rod. Rhiley Solem MD   Signed by:   Shary Decamp on 08/13/2009   Method used:   Electronically to        [facility] [address] [phone]* (retail)       [address]       [address]       [place], [place]  [address]       Ph: [phone]       Fax: [phone]   RxID:   [id number] CLONIDINE HCL 0.1 MG TABS (CLONIDINE HCL) Take 1 tablet by mouth twice a day  #180.0 Each x 1   Entered by:   Shary Decamp   Authorized by:   Nolon Rod. Lux Skilton MD   Signed by:   Shary Decamp on 08/13/2009   Method used:   Electronically to        [facility] [address] [phone]* (retail)       [address]       [address]       [place], [place]  [address]  Ph: [phone]       Fax: [phone]   RxID:   [id number]

## 2010-05-11 NOTE — Letter (Signed)
Summary: Results Follow up Letter  [facility] [place]  [address] [place], [place] [address]   Phone: [phone]  Fax: [phone]    04/20/2009 MRN: [id number]  [facility] [address] [place], [place]  [address]  Dear Ms. Noguez,    Dr. Drue Novel has reviewed your lab work that was done on 04/15/09.  Your cholesterol is excellent.  Diabetes used to be better but it is still ok.  No change in your medications just need to watch your diet!!  Call me if you have any questions or concerns.   Alena Bills [phone] ext [phone]

## 2010-05-13 NOTE — Progress Notes (Signed)
Summary: Actos questions  Phone Note Call from Patient   Caller: Daughter--[phone] Summary of Call: Patient daughter called stating that the patient is not comfortable taking the Actos due to everything in the news and tv about it. Daughter states that her family would like to know why he took the patient off of it and then put her back on it. Please advise. Initial call taken by: Lucious Groves CMA,  April 27, 2010 4:14 PM  Follow-up for Phone Call        patient self discontinued Actos. She is back on it because her diabetes is not well controlled. She doesn't have many other options because her kidneys are  not working well. her options are - take Actos - not take Actos understanding her sugars are not well controlled - insulin - she could also stop Actos and start a medication called Precose 25mg  1 with each meal. She  may like this option a lot because it is safe, and compliments her other medications well. If she is willing to try, call #[phone], [phone]   Follow-up by: Parrie Rasco E. Arlis Yale MD,  April 28, 2010 11:05 AM  Additional Follow-up for Phone Call Additional follow up Details #1::        Called to inform, and voicemal has not been set up yet. Lucious Groves CMA  April 28, 2010 11:26 AM   Tried again, same as above. Lucious Groves CMA  April 28, 2010 4:38 PM   Tried again, same result. I also tried patient home number listed in chart, left message on machine to call back to office. Lucious Groves CMA  April 29, 2010 3:25 PM     Additional Follow-up for Phone Call Additional follow up Details #2::    No return call, tried number above again and still could not leave message. *Closed phone note until patient or daughter calls back. Lucious Groves CMA  April 30, 2010 3:38 PM

## 2010-05-13 NOTE — Assessment & Plan Note (Signed)
Summary: 4 MONTH FOLLOWUP///SPH   Vital Signs:  Patient profile:   77 year old female Weight:      150.50 pounds Pulse rate:   70 / minute Pulse rhythm:   regular BP sitting:   136 / 60  (left arm) Cuff size:   large  Vitals Entered By: Army Fossa CMA (April 16, 2010 8:57 AM) CC: 4 month f/u- fasting  Comments c/o (R) thumb being sore [facility]    History of Present Illness: 4 month f/u- fasting    OA--  c/o (R) thumb being sore   Diabetes--  ambulatory CBGs  "OK" , can not tell readings       Current Medications (verified): 1)  Clonidine Hcl 0.1 Mg Tabs (Clonidine Hcl) .... Take 1 Tablet By Mouth Twice A Day 2)  Furosemide 20 Mg Tabs (Furosemide) .... Take 2 Tablet By Mouth Once A Day- 3)  Vitamin B12 4)  Asa 5)  Glimepiride 2 Mg Tabs (Glimepiride) .... 2  By Mouth Once Daily 6)  Januvia 50 Mg Tabs (Sitagliptin Phosphate) .Marland Kitchen.. 1 By Mouth Once Daily 7)  Simvastatin 40 Mg Tabs (Simvastatin) .... 1/2 Tablet A Day 8)  Tylenol Extra Strength 500 Mg Tabs (Acetaminophen) .Marland Kitchen.. 1 or 2 Tabs Every 6 Hours As Needed 9)  Onetouch Ultra Blue  Strp (Glucose Blood) .... As Directed. 10)  Onetouch Ultrasoft Lancets  Misc (Lancets) .... As Directed.  Allergies (verified): 1)  ! Lisinopril 2)  ! 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  syphillis, treated c/ PCN 1993 Osteoarthritis  Past Surgical History: Reviewed history from 11/23/2007 and no changes required. rt cochlear implant 1/02 Hysterectomy--BSO Nephrectomy - lt Oophorectomy  Social History: Reviewed history from 12/16/2009 and no changes required. 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 Resp:  Denies cough and shortness of breath. GI:  Denies diarrhea, nausea, and vomiting. Endo:  no low sugar type of symptoms "I feel terrific".  Physical Exam  General:  alert and well-developed.   Lungs:  normal respiratory effort, no intercostal retractions, no accessory muscle use, and normal breath sounds.   Heart:  normal rate, regular rhythm, and no murmur.   Extremities:  no lower extremity edema base of the right thumb with changes consistent with DJD, no swelling or redness. The area is slightly tender Psych:  good spirits as usual   Impression & Recommendations:  Problem # 1:  OSTEOARTHRITIS (ICD-715.90) pain in the right thumb likely from DJD, reminded her to stay away from Motrin. Use Tylenol. Patient knows to call me if the symptoms increase, she will need a referral to orthopedic surgery for possible local injection Her updated medication list for this problem includes:    Tylenol Extra Strength 500 Mg Tabs (Acetaminophen) .Marland Kitchen... 1 or 2 tabs every 6 hours as needed  Problem # 2:  RENAL INSUFFICIENCY, CHRONIC (ICD-585.9) stable over time, monitor her anemia Orders: TLB-Hemoglobin (Hgb) (85018-HGB)  Problem # 3:  DIABETES MELLITUS, TYPE II (ICD-250.00)  labs Her updated medication list for this problem includes:    Glimepiride 2 Mg Tabs (Glimepiride) .Marland Kitchen... 2  by mouth once daily  Januvia 50 Mg Tabs (Sitagliptin phosphate) .Marland Kitchen... 1 by mouth once daily  Orders: TLB-A1C / Hgb A1C (Glycohemoglobin) (83036-A1C) TLB-Microalbumin/Creat Ratio, Urine (82043-MALB)  Labs Reviewed: Creat: 2.1 (12/16/2009)    Reviewed HgBA1c results: 9.0 (12/16/2009)  8.8 (08/13/2009)  Complete Medication List: 1)  Clonidine Hcl 0.1 Mg Tabs (Clonidine hcl) .... Take 1 tablet by mouth twice a day 2)  Furosemide 20 Mg Tabs (Furosemide) .... Take 2 tablet by mouth once a day- 3)  Vitamin B12  4)  Asa  5)  Glimepiride 2 Mg Tabs  (Glimepiride) .... 2  by mouth once daily 6)  Januvia 50 Mg Tabs (Sitagliptin phosphate) .Marland Kitchen.. 1 by mouth once daily 7)  Simvastatin 40 Mg Tabs (Simvastatin) .... 1/2 tablet a day 8)  Tylenol Extra Strength 500 Mg Tabs (Acetaminophen) .Marland Kitchen.. 1 or 2 tabs every 6 hours as needed 9)  Onetouch Ultra Blue Strp (Glucose blood) .... As directed. 10)  Onetouch Ultrasoft Lancets Misc (Lancets) .... As directed.  Other Orders: Venipuncture (19147) TLB-Lipid Panel (80061-LIPID)  Patient Instructions: 1)  Please schedule a follow-up appointment in 4 to 5 months .    Orders Added: 1)  Venipuncture [36415] 2)  TLB-A1C / Hgb A1C (Glycohemoglobin) [83036-A1C] 3)  TLB-Lipid Panel [80061-LIPID] 4)  TLB-Hemoglobin (Hgb) [85018-HGB] 5)  TLB-Microalbumin/Creat Ratio, Urine [82043-MALB] 6)  Est. Patient Level III [82956]

## 2010-07-09 ENCOUNTER — Other Ambulatory Visit: Payer: Self-pay | Admitting: Internal Medicine

## 2010-07-09 NOTE — Telephone Encounter (Signed)
Okay to call one month and 4 refills

## 2010-07-14 ENCOUNTER — Other Ambulatory Visit: Payer: Self-pay | Admitting: Internal Medicine

## 2010-07-14 NOTE — Telephone Encounter (Signed)
Ok 60, 12 RF

## 2010-08-08 ENCOUNTER — Other Ambulatory Visit: Payer: Self-pay | Admitting: Internal Medicine

## 2010-09-04 ENCOUNTER — Other Ambulatory Visit: Payer: Self-pay | Admitting: Internal Medicine

## 2010-09-07 ENCOUNTER — Encounter: Payer: Self-pay | Admitting: Internal Medicine

## 2010-09-07 ENCOUNTER — Ambulatory Visit (INDEPENDENT_AMBULATORY_CARE_PROVIDER_SITE_OTHER): Payer: Medicare Other | Admitting: Internal Medicine

## 2010-09-07 DIAGNOSIS — E119 Type 2 diabetes mellitus without complications: Secondary | ICD-10-CM

## 2010-09-07 DIAGNOSIS — N189 Chronic kidney disease, unspecified: Secondary | ICD-10-CM

## 2010-09-07 LAB — BASIC METABOLIC PANEL
CO2: 26 mEq/L (ref 19–32)
Calcium: 9.7 mg/dL (ref 8.4–10.5)
Creatinine, Ser: 2.3 mg/dL — ABNORMAL HIGH (ref 0.4–1.2)
Glucose, Bld: 217 mg/dL — ABNORMAL HIGH (ref 70–99)

## 2010-09-07 MED ORDER — FUROSEMIDE 20 MG PO TABS: 20.0000 mg | ORAL_TABLET | Freq: Every day | ORAL | Status: DC

## 2010-09-07 MED ORDER — SITAGLIPTIN PHOSPHATE 50 MG PO TABS: ORAL_TABLET | ORAL | Status: DC

## 2010-09-07 MED ORDER — SIMVASTATIN 40 MG PO TABS: ORAL_TABLET | ORAL | Status: DC

## 2010-09-07 MED ORDER — CLONIDINE HCL 0.1 MG PO TABS: ORAL_TABLET | ORAL | Status: DC

## 2010-09-07 NOTE — Progress Notes (Signed)
  Subjective:    Patient ID: Jeanne Barker, female    DOB: May 09, 1923, 77 y.o.   MRN: [id number]  HPI Diabetes, based on the last hemoglobin A1c we prescribe Actos. Patient self discontinued Actos because she did not feel well, cannot elaborate.  Past Medical History  Diagnosis Date  . Anemia   . Diabetes mellitus   . Hyperlipidemia   . Chronic renal insufficiency   . Hard of hearing   . Syphilis     ?latent?, treated c/ PCN 1993  . Osteoarthritis    Past Surgical History  Procedure Date  . Cochlear impalnt 1/02  . Abdominal hysterectomy     BSO  . Nephrectomy     left  . 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 States she checked her blood sugars from time to time, cannot tell me actual readings. States she checked her BPs, cannot tell me actual readings. Continued right knee pain, on Tylenol.    Objective:   Physical Exam  Constitutional: She appears well-developed. No distress.  Cardiovascular: Normal rate, regular rhythm and normal heart sounds.   No murmur heard. Pulmonary/Chest: Effort normal and breath sounds normal. No respiratory distress. She has no wheezes. She has no rales.          Assessment & Plan:

## 2010-09-07 NOTE — Assessment & Plan Note (Signed)
Due for labs

## 2010-09-07 NOTE — Assessment & Plan Note (Addendum)
We added actos base on las A1C, states could not tolerate "I didn't feel well ", can not elaborate further. Labs  Precose? Insulin? (Daughter reports that would be very hard for her to accept)

## 2010-09-10 ENCOUNTER — Telehealth: Payer: Self-pay | Admitting: *Deleted

## 2010-09-10 NOTE — Telephone Encounter (Signed)
Message copied by Leanne Lovely on Fri Sep 10, 2010 10:15 AM ------      Message from: [name] E      Created: Thu Sep 09, 2010  5:45 PM       Also K slt low, send a low K diet

## 2010-09-10 NOTE — Telephone Encounter (Signed)
Message copied by Leanne Lovely on Fri Sep 10, 2010 10:15 AM ------      Message from: [name] E      Created: Thu Sep 09, 2010  5:44 PM       Diabetes needs better control, A1c should be close to a 0.0.      Advise patient: Diabetes not well controlled      options are:      1. D/c glimepiride and use Lantus single shot a day      2. Replace glimepiride w/ prandin 0.5 tid       3. Add vytoza , single shot daily.      4. refer to endocrinology.      Let me know or arrange a OV to discuss

## 2010-09-10 NOTE — Telephone Encounter (Signed)
Message copied by Leanne Lovely on Fri Sep 10, 2010 10:16 AM ------      Message from: Jeanne Barker      Created: Thu Sep 09, 2010  5:44 PM       Diabetes needs better control, A1c should be close to a 0.0.      Advise patient: Diabetes not well controlled      options are:      1. D/c glimepiride and use Lantus single shot a day      2. Replace glimepiride w/ prandin 0.5 tid       3. Add vytoza , single shot daily.      4. refer to endocrinology.      Let me know or arrange a OV to discuss

## 2010-09-10 NOTE — Telephone Encounter (Signed)
Left message for pt to call back-- she needs an appt to come in.    Diabetes needs better control, A1c should be close to a 0.0. Advise patient: Diabetes not well controlled options are: 1. D/c glimepiride and use Lantus single shot a day 2. Replace glimepiride w/ prandin 0.5 tid  3. Add vytoza , single shot daily. 4. refer to endocrinology. Let me know or arrange a OV to discuss

## 2010-09-13 NOTE — Telephone Encounter (Signed)
Message left for patient to return my call.  

## 2010-09-14 NOTE — Telephone Encounter (Signed)
Message left for patient to return my call.  

## 2010-09-15 ENCOUNTER — Encounter: Payer: Self-pay | Admitting: *Deleted

## 2010-09-15 NOTE — Telephone Encounter (Signed)
Not been able to reach pt by phone, called and left a message on her contact person's phone that she needed to come in for an appt, also mailed pt a letter stating she needed to come in for an appt.

## 2010-09-15 NOTE — Telephone Encounter (Signed)
Agree, thank you

## 2010-09-16 NOTE — Telephone Encounter (Signed)
Pt daughter Kirt Boys came in to office today, reviewed labs and options.Pt daughter advises that she will discuss with mother and call back for OV once they have decided on what they would like to do.

## 2010-09-22 ENCOUNTER — Encounter: Payer: Self-pay | Admitting: Internal Medicine

## 2010-09-22 ENCOUNTER — Ambulatory Visit (INDEPENDENT_AMBULATORY_CARE_PROVIDER_SITE_OTHER): Payer: Medicare Other | Admitting: Internal Medicine

## 2010-09-22 DIAGNOSIS — E119 Type 2 diabetes mellitus without complications: Secondary | ICD-10-CM

## 2010-09-22 DIAGNOSIS — N189 Chronic kidney disease, unspecified: Secondary | ICD-10-CM

## 2010-09-22 LAB — POTASSIUM: Potassium: 4.4 mEq/L (ref 3.5–5.1)

## 2010-09-22 NOTE — Assessment & Plan Note (Signed)
Recheck K Low K diet discussed and info provided

## 2010-09-22 NOTE — Progress Notes (Signed)
  Subjective:    Patient ID: Jeanne Barker, female    DOB: 1924-02-13, 77 y.o.   MRN: [id number]  HPI Here to discuss her recent labs, A1c was > 9.0, potassium was 5.3  Past Medical History  Diagnosis Date  . Anemia   . Diabetes mellitus   . Hyperlipidemia   . Chronic renal insufficiency   . Hard of hearing   . Syphilis     ?latent?, treated c/ PCN 1993  . Osteoarthritis    Past Surgical History  Procedure Date  . Cochlear impalnt 1/02  . Abdominal hysterectomy     BSO  . Nephrectomy     left  . Oophorectomy      Review of Systems Patient states she feels terrific, doing great, she is energetic and has no problems whatsoever    Objective:   Physical Exam Alert, oriented x3, quite energetic       Assessment & Plan:  Today , I spent more than 15 min with the patient, >50% of the time counseling

## 2010-09-22 NOTE — Assessment & Plan Note (Addendum)
Last hemoglobin A1c 9.5, goal =  8.0 I discussed with the patient the fact that her diabetes is not well controlled and even if she feels well that may  lead to future problems including heart disease, kidney shutdown etc. Options include change medicines, adjust her medications. Patient is quite reluctant to change anything  because she feels great. She was quite strong about this fact. I think she is independent to make her own decisions, I can't but agree with her and  respect her wishes. Return to the office in 4 months

## 2010-09-23 ENCOUNTER — Telehealth: Payer: Self-pay | Admitting: *Deleted

## 2010-09-23 NOTE — Telephone Encounter (Signed)
Message copied by Leanne Lovely on Thu Sep 23, 2010  8:47 AM ------      Message from: [name] E      Created: Wed Sep 22, 2010  6:03 PM       Advise patient, potassium is now normal

## 2010-09-23 NOTE — Telephone Encounter (Signed)
Message left for patient to return my call.  

## 2010-09-24 NOTE — Telephone Encounter (Signed)
Message left for patient to return my call.  

## 2010-09-27 NOTE — Telephone Encounter (Signed)
Message left for patient to return my call.  

## 2010-09-29 NOTE — Telephone Encounter (Signed)
Pt's daughter is aware 

## 2010-12-05 ENCOUNTER — Other Ambulatory Visit: Payer: Self-pay | Admitting: Internal Medicine

## 2011-01-07 ENCOUNTER — Other Ambulatory Visit: Payer: Self-pay | Admitting: Internal Medicine

## 2011-01-10 ENCOUNTER — Ambulatory Visit (INDEPENDENT_AMBULATORY_CARE_PROVIDER_SITE_OTHER): Payer: Medicare Other | Admitting: Internal Medicine

## 2011-01-10 ENCOUNTER — Encounter: Payer: Self-pay | Admitting: Internal Medicine

## 2011-01-10 DIAGNOSIS — E119 Type 2 diabetes mellitus without complications: Secondary | ICD-10-CM

## 2011-01-10 DIAGNOSIS — N189 Chronic kidney disease, unspecified: Secondary | ICD-10-CM

## 2011-01-10 DIAGNOSIS — M79606 Pain in leg, unspecified: Secondary | ICD-10-CM

## 2011-01-10 DIAGNOSIS — M1990 Unspecified osteoarthritis, unspecified site: Secondary | ICD-10-CM

## 2011-01-10 DIAGNOSIS — M79609 Pain in unspecified limb: Secondary | ICD-10-CM

## 2011-01-10 LAB — CBC WITH DIFFERENTIAL/PLATELET
Eosinophils Relative: 1.8 % (ref 0.0–5.0)
HCT: 32.3 % — ABNORMAL LOW (ref 36.0–46.0)
Hemoglobin: 10.6 g/dL — ABNORMAL LOW (ref 12.0–15.0)
Lymphocytes Relative: 29.4 % (ref 12.0–46.0)
Lymphs Abs: 1.8 10*3/uL (ref 0.7–4.0)
Monocytes Relative: 10.8 % (ref 3.0–12.0)
Neutro Abs: 3.5 10*3/uL (ref 1.4–7.7)
WBC: 6.3 10*3/uL (ref 4.5–10.5)

## 2011-01-10 LAB — CK TOTAL AND CKMB (NOT AT ARMC): CK, MB: 1.4 ng/mL (ref 0.3–4.0)

## 2011-01-10 LAB — BASIC METABOLIC PANEL
BUN: 48 mg/dL — ABNORMAL HIGH (ref 6–23)
CO2: 26 mEq/L (ref 19–32)
Chloride: 102 mEq/L (ref 96–112)
Creatinine, Ser: 2.8 mg/dL — ABNORMAL HIGH (ref 0.4–1.2)
Glucose, Bld: 161 mg/dL — ABNORMAL HIGH (ref 70–99)

## 2011-01-10 LAB — HEMOGLOBIN A1C: Hgb A1c MFr Bld: 9.1 % — ABNORMAL HIGH (ref 4.6–6.5)

## 2011-01-10 LAB — SEDIMENTATION RATE: Sed Rate: 46 mm/hr — ABNORMAL HIGH (ref 0–22)

## 2011-01-10 NOTE — Assessment & Plan Note (Addendum)
Last hemoglobin A1c 9.5, goal =  8.0 See previous entry, is declining to take more medicines. Labs

## 2011-01-10 NOTE — Assessment & Plan Note (Addendum)
Today he complains of moderate to severe leg pain. Vascular exam with question of a decreased pedal pulses. DDX: DJD, side effects from statins, PMR less likely but possible, neuropathy, radiculopathy, etc. Plan: CBC, sedimentation rate, CK, ABIs. Refer to ortho, hold statins , tylenol

## 2011-01-10 NOTE — Assessment & Plan Note (Signed)
Labs

## 2011-01-10 NOTE — Patient Instructions (Signed)
Hold simvastatin x 3 weeks, see if that help the pain Tylenol for pain, if you need something stronger let me know

## 2011-01-10 NOTE — Progress Notes (Signed)
  Subjective:    Patient ID: Jeanne Barker, female    DOB: 08-Jul-1923, 77 y.o.   MRN: [id number]  HPI Routine office visit Diabetes, good medication compliance Hypercholesterol, good medication compliance Complains of right leg pain "for a while" the pain is mostly when the area is touched, not much worse when she walks, located from the right hip down to the whole leg particularly at  the thight.  Past Medical History  Diagnosis Date  . Anemia   . Diabetes mellitus   . Hyperlipidemia   . Chronic renal insufficiency   . Hard of hearing   . Syphilis     ?latent?, treated c/ PCN 1993  . Osteoarthritis    Past Surgical History  Procedure Date  . Cochlear impalnt 1/02  . Abdominal hysterectomy     BSO  . Nephrectomy     left  . Oophorectomy      Review of Systems Some right-sided back pain. No rash or lower extremity edema No classic claudication No fever or chills No HA or aches in other areas      Objective:   Physical Exam  Constitutional: She appears well-developed and well-nourished.  Cardiovascular: Normal rate, regular rhythm and normal heart sounds.   No murmur heard.      Good bilateral femoral pulses. Right pedal pulse is slightly decreased?, Normal left pedal pulse  Pulmonary/Chest: Breath sounds normal. No respiratory distress. She has no wheezes. She has no rales.  Musculoskeletal:       Left leg normal to palpation, knee deformities consistent with DJD, hip with normal rotation. Right leg: No edema, no rash, no warmness but TTP throughout , she does have pain with hip rotation, knee flexion extension, also pain at the trochanteric bursa.  Neurological:       Upper normal, DTRs symmetric          Assessment & Plan:

## 2011-01-11 ENCOUNTER — Ambulatory Visit (INDEPENDENT_AMBULATORY_CARE_PROVIDER_SITE_OTHER)
Admission: RE | Admit: 2011-01-11 | Discharge: 2011-01-11 | Disposition: A | Discharge status: Home / Self Care | Payer: Medicare Other | Source: Ambulatory Visit | Attending: Internal Medicine | Admitting: Internal Medicine

## 2011-01-11 DIAGNOSIS — M1990 Unspecified osteoarthritis, unspecified site: Secondary | ICD-10-CM

## 2011-01-12 ENCOUNTER — Other Ambulatory Visit: Payer: Self-pay | Admitting: Cardiology

## 2011-01-12 DIAGNOSIS — M79609 Pain in unspecified limb: Secondary | ICD-10-CM

## 2011-01-13 ENCOUNTER — Encounter (INDEPENDENT_AMBULATORY_CARE_PROVIDER_SITE_OTHER): Payer: Medicare Other | Admitting: *Deleted

## 2011-01-13 DIAGNOSIS — E1159 Type 2 diabetes mellitus with other circulatory complications: Secondary | ICD-10-CM

## 2011-01-13 DIAGNOSIS — M79609 Pain in unspecified limb: Secondary | ICD-10-CM

## 2011-02-06 ENCOUNTER — Other Ambulatory Visit: Payer: Self-pay | Admitting: Internal Medicine

## 2011-02-07 ENCOUNTER — Other Ambulatory Visit: Payer: Self-pay

## 2011-02-17 ENCOUNTER — Encounter: Payer: Self-pay | Admitting: Internal Medicine

## 2011-02-18 ENCOUNTER — Ambulatory Visit (INDEPENDENT_AMBULATORY_CARE_PROVIDER_SITE_OTHER): Payer: Medicare Other | Admitting: Internal Medicine

## 2011-02-18 ENCOUNTER — Encounter: Payer: Self-pay | Admitting: Internal Medicine

## 2011-02-18 VITALS — BP 112/62 | HR 77 | Temp 98.3°F | Resp 18 | Ht 62.0 in | Wt 148.2 lb

## 2011-02-18 DIAGNOSIS — E785 Hyperlipidemia, unspecified: Secondary | ICD-10-CM

## 2011-02-18 DIAGNOSIS — M1990 Unspecified osteoarthritis, unspecified site: Secondary | ICD-10-CM

## 2011-02-18 NOTE — Progress Notes (Signed)
  Subjective:    Patient ID: Jeanne Barker, female    DOB: 10-11-1923, 77 y.o.   MRN: [id number]  HPI F/u from last OV Chart reviewed, see a/p  Past Medical History  Diagnosis Date  . Anemia   . Diabetes mellitus   . Hyperlipidemia   . Chronic renal insufficiency   . Hard of hearing   . Syphilis     ?latent?, treated c/ PCN 1993  . Osteoarthritis      Review of Systems LE pain is about the same , holding simva did not help    Objective:   Physical Exam  Constitutional: She appears well-developed and well-nourished.  Cardiovascular: Normal rate, regular rhythm and normal heart sounds.   Pulmonary/Chest: Effort normal and breath sounds normal. No respiratory distress. She has no wheezes. She has no rales.  Musculoskeletal: She exhibits no edema.       Tender R leg as before          Assessment & Plan:

## 2011-02-18 NOTE — Assessment & Plan Note (Signed)
Holding  statins did not helped pain, ok to go back to it

## 2011-02-18 NOTE — Patient Instructions (Signed)
Go back to simvastatin 

## 2011-02-18 NOTE — Assessment & Plan Note (Addendum)
ABIs were negative, sed rate 46, XR DJD Discussed w/ patient possible ortho referral (MRI of the back? Local injections if she has a radiculopathy?)----> elected not to go for further evaluation for now, will continue w/ tylenol and call anytime if likes/needs further treatment

## 2011-03-11 ENCOUNTER — Other Ambulatory Visit: Payer: Self-pay | Admitting: Internal Medicine

## 2011-04-08 ENCOUNTER — Other Ambulatory Visit: Payer: Self-pay | Admitting: Internal Medicine

## 2011-05-24 ENCOUNTER — Ambulatory Visit (INDEPENDENT_AMBULATORY_CARE_PROVIDER_SITE_OTHER): Payer: Medicare Other | Admitting: Internal Medicine

## 2011-05-24 VITALS — BP 110/64 | HR 63 | Temp 98.3°F | Wt 139.0 lb

## 2011-05-24 DIAGNOSIS — E785 Hyperlipidemia, unspecified: Secondary | ICD-10-CM | POA: Diagnosis not present

## 2011-05-24 DIAGNOSIS — E119 Type 2 diabetes mellitus without complications: Secondary | ICD-10-CM | POA: Diagnosis not present

## 2011-05-24 DIAGNOSIS — N189 Chronic kidney disease, unspecified: Secondary | ICD-10-CM | POA: Diagnosis not present

## 2011-05-24 LAB — LIPID PANEL
Cholesterol: 200 mg/dL (ref 0–200)
HDL: 62 mg/dL (ref >39)
LDL Cholesterol: 106 mg/dL — ABNORMAL HIGH (ref 0–99)
Triglycerides: 159 mg/dL — ABNORMAL HIGH (ref <150)
VLDL: 32 mg/dL (ref 0–40)

## 2011-05-24 LAB — AST: AST: 21 U/L (ref 0–37)

## 2011-05-24 LAB — BASIC METABOLIC PANEL
BUN: 41 mg/dL — ABNORMAL HIGH (ref 6–23)
CO2: 21 mEq/L (ref 19–32)
Calcium: 10 mg/dL (ref 8.4–10.5)
Chloride: 106 mEq/L (ref 96–112)
Creat: 2.24 mg/dL — ABNORMAL HIGH (ref 0.50–1.10)

## 2011-05-24 NOTE — Assessment & Plan Note (Addendum)
Well-controlled per previous cholesterol panel. Due for labs

## 2011-05-24 NOTE — Assessment & Plan Note (Addendum)
Seems to be doing well, last eye check up 2-12. Diabetes is not well controlled the patient declined strongly to take any additional medication.  Check a A1c

## 2011-05-24 NOTE — Assessment & Plan Note (Addendum)
Last creatinine was slightly elevated . Due for labs

## 2011-05-24 NOTE — Progress Notes (Signed)
  Subjective:    Patient ID: Jeanne Barker, female    DOB: 02-22-1924, 78 y.o.   MRN: [id number]  HPI Routine office visit Diabetes, taking her medications as prescribed. Last eye check up was 05-2010. Hypertension, good medication compliance, not ambulatory BPs. High cholesterol, good medication compliance. No apparent side effects.  Past Medical History  Diagnosis Date  . Anemia   . Diabetes mellitus   . Hyperlipidemia   . Chronic renal insufficiency   . Hard of hearing   . Syphilis     ?latent?, treated c/ PCN 1993  . Osteoarthritis    Past Surgical History  Procedure Date  . Cochlear impalnt 1/02  . Abdominal hysterectomy     BSO  . Nephrectomy     left  . Oophorectomy      Review of Systems "I'm doing just fine, I don't know why him here" Denies chest pain or shortness of breath. No symptoms consistent with low blood sugars. No nausea, vomiting, diarrhea. As far as her DJD pain, it is not an issue at this point. She still works, very active   Objective:   Physical Exam  Constitutional: She is oriented to person, place, and time. She appears well-developed and well-nourished.  Cardiovascular: Normal rate, regular rhythm and normal heart sounds.   Pulmonary/Chest: Effort normal and breath sounds normal. No respiratory distress. She has no wheezes. She has no rales.  Musculoskeletal: She exhibits no edema.  Neurological: She is alert and oriented to person, place, and time.  Psychiatric:       Good spirits as usual       Assessment & Plan:  In general doing well, will asked to come back to the office in 6 months + as needed. Patient does not desire to be seen more often. RF medicines as requested

## 2011-05-25 ENCOUNTER — Encounter: Payer: Self-pay | Admitting: Internal Medicine

## 2011-05-25 LAB — HEMOGLOBIN A1C
Hgb A1c MFr Bld: 8.1 % — ABNORMAL HIGH (ref <5.7)
Mean Plasma Glucose: 186 mg/dL — ABNORMAL HIGH (ref <117)

## 2011-05-28 ENCOUNTER — Encounter: Payer: Self-pay | Admitting: Internal Medicine

## 2011-05-30 ENCOUNTER — Other Ambulatory Visit: Payer: Self-pay | Admitting: Internal Medicine

## 2011-05-30 NOTE — Telephone Encounter (Signed)
Refill done.  

## 2011-07-10 ENCOUNTER — Other Ambulatory Visit: Payer: Self-pay | Admitting: Internal Medicine

## 2011-07-11 NOTE — Telephone Encounter (Signed)
Refill done.  

## 2011-07-22 DIAGNOSIS — H2510 Age-related nuclear cataract, unspecified eye: Secondary | ICD-10-CM | POA: Diagnosis not present

## 2011-08-29 DIAGNOSIS — H269 Unspecified cataract: Secondary | ICD-10-CM | POA: Diagnosis not present

## 2011-08-29 DIAGNOSIS — H2510 Age-related nuclear cataract, unspecified eye: Secondary | ICD-10-CM | POA: Diagnosis not present

## 2011-09-19 DIAGNOSIS — H269 Unspecified cataract: Secondary | ICD-10-CM | POA: Diagnosis not present

## 2011-09-19 DIAGNOSIS — H2510 Age-related nuclear cataract, unspecified eye: Secondary | ICD-10-CM | POA: Diagnosis not present

## 2011-10-09 ENCOUNTER — Other Ambulatory Visit: Payer: Self-pay | Admitting: Internal Medicine

## 2011-10-10 NOTE — Telephone Encounter (Signed)
Refill done.  

## 2011-11-10 ENCOUNTER — Other Ambulatory Visit: Payer: Self-pay | Admitting: Internal Medicine

## 2011-11-10 NOTE — Telephone Encounter (Signed)
Refill done.  

## 2011-11-21 ENCOUNTER — Ambulatory Visit: Payer: Medicare Other | Admitting: Internal Medicine

## 2011-11-23 ENCOUNTER — Other Ambulatory Visit: Payer: Self-pay | Admitting: Internal Medicine

## 2011-12-03 ENCOUNTER — Other Ambulatory Visit: Payer: Self-pay | Admitting: Internal Medicine

## 2011-12-05 NOTE — Telephone Encounter (Signed)
Refill done.  

## 2011-12-07 ENCOUNTER — Encounter: Payer: Self-pay | Admitting: Internal Medicine

## 2011-12-07 ENCOUNTER — Ambulatory Visit (INDEPENDENT_AMBULATORY_CARE_PROVIDER_SITE_OTHER): Payer: Medicare Other | Admitting: Internal Medicine

## 2011-12-07 VITALS — BP 108/68 | HR 61 | Temp 97.8°F | Wt 140.0 lb

## 2011-12-07 DIAGNOSIS — E119 Type 2 diabetes mellitus without complications: Secondary | ICD-10-CM

## 2011-12-07 DIAGNOSIS — N189 Chronic kidney disease, unspecified: Secondary | ICD-10-CM

## 2011-12-07 DIAGNOSIS — D649 Anemia, unspecified: Secondary | ICD-10-CM

## 2011-12-07 LAB — CBC WITH DIFFERENTIAL/PLATELET
Basophils Relative: 0.7 % (ref 0.0–3.0)
Eosinophils Relative: 3.1 % (ref 0.0–5.0)
Lymphocytes Relative: 27.9 % (ref 12.0–46.0)
Monocytes Relative: 9.4 % (ref 3.0–12.0)
Neutrophils Relative %: 58.9 % (ref 43.0–77.0)
Platelets: 307 10*3/uL (ref 150.0–400.0)
RBC: 3.71 Mil/uL — ABNORMAL LOW (ref 3.87–5.11)
WBC: 4.9 10*3/uL (ref 4.5–10.5)

## 2011-12-07 LAB — BASIC METABOLIC PANEL
Calcium: 9 mg/dL (ref 8.4–10.5)
Chloride: 104 mEq/L (ref 96–112)
Creatinine, Ser: 2 mg/dL — ABNORMAL HIGH (ref 0.4–1.2)
GFR: 30.71 mL/min — ABNORMAL LOW (ref >60.00)

## 2011-12-07 LAB — HEMOGLOBIN A1C: Hgb A1c MFr Bld: 8.1 % — ABNORMAL HIGH (ref 4.6–6.5)

## 2011-12-07 NOTE — Assessment & Plan Note (Signed)
Long history of CRI, in the past has seen nephrology but has declined to go back to them. Not on ACE inhibitors due to to hyperkalemia. Doing well, check a BMP.

## 2011-12-07 NOTE — Assessment & Plan Note (Signed)
H/o  anemia thought to be related to chronic dz, has refused to go back to nephrology or have EPO shots Plan: Monitor   hemoglobin

## 2011-12-07 NOTE — Assessment & Plan Note (Addendum)
Based on some research her daughter did, she discontinue Actos. A1c goal is less than 8. Plan: Check A1c, if not at goal we may need to simply observe because the patient is quite reluctant to take any additional medication. That being said, she is doing  remarkably well.

## 2011-12-07 NOTE — Progress Notes (Signed)
  Subjective:    Patient ID: Jeanne Barker, female    DOB: 03/18/1924, 78 y.o.   MRN: [id number]  HPI Routine office visit, here with her daughter, we discussed the following issues: Diabetes, self discontinue Actos after her daughter did some research on line. Did not like the potential side effects. High cholesterol, good medication compliance, no aches and pains other than knee pain related to DJD.    Past Medical History: Anemia  Diabetes mellitus, type II Hyperlipidemia Chronic Renal insufficiency hard of hearing h/o congenital ?latent?  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 Still doing very well, she remains independent and feels extremely happy.     Objective:   Physical Exam General -- alert, well-developed. No apparent distress.  Lungs -- normal respiratory effort, no intercostal retractions, no accessory muscle use, and normal breath sounds.   Heart-- normal rate, regular rhythm, no murmur, and no gallop.    Extremities-- trace symmetric  pretibial edema Neurologic-- alert & oriented in self, time and space.  Psych-- cooperative with normal attention span and concentration.  not anxious appearing and not depressed appearing.       Assessment & Plan:

## 2011-12-09 ENCOUNTER — Encounter: Payer: Self-pay | Admitting: *Deleted

## 2012-01-05 ENCOUNTER — Telehealth: Payer: Self-pay | Admitting: Internal Medicine

## 2012-01-05 NOTE — Telephone Encounter (Signed)
Refill: Glimepiride tab 2 mg. Request for 90 day supply. Last fill 8.27.13

## 2012-01-05 NOTE — Telephone Encounter (Signed)
Spoke with pharmacy & they confirmed pt still has refills left on file. Refill request sent in error.

## 2012-01-11 DIAGNOSIS — Z23 Encounter for immunization: Secondary | ICD-10-CM | POA: Diagnosis not present

## 2012-02-10 DIAGNOSIS — H905 Unspecified sensorineural hearing loss: Secondary | ICD-10-CM | POA: Diagnosis not present

## 2012-02-10 DIAGNOSIS — Z9689 Presence of other specified functional implants: Secondary | ICD-10-CM | POA: Diagnosis not present

## 2012-02-10 DIAGNOSIS — Z462 Encounter for fitting and adjustment of other devices related to nervous system and special senses: Secondary | ICD-10-CM | POA: Diagnosis not present

## 2012-02-16 ENCOUNTER — Other Ambulatory Visit: Payer: Self-pay | Admitting: Internal Medicine

## 2012-02-16 NOTE — Telephone Encounter (Signed)
Refill done.  

## 2012-03-09 ENCOUNTER — Other Ambulatory Visit: Payer: Self-pay | Admitting: Internal Medicine

## 2012-03-09 NOTE — Telephone Encounter (Signed)
Refill done.  

## 2012-04-15 ENCOUNTER — Other Ambulatory Visit: Payer: Self-pay | Admitting: Internal Medicine

## 2012-04-16 NOTE — Telephone Encounter (Signed)
Refill done.  

## 2012-05-05 ENCOUNTER — Other Ambulatory Visit: Payer: Self-pay | Admitting: Internal Medicine

## 2012-05-07 NOTE — Telephone Encounter (Signed)
Refill done.  

## 2012-06-08 ENCOUNTER — Encounter: Payer: Self-pay | Admitting: Internal Medicine

## 2012-06-08 ENCOUNTER — Ambulatory Visit (INDEPENDENT_AMBULATORY_CARE_PROVIDER_SITE_OTHER): Payer: Medicare Other | Admitting: Internal Medicine

## 2012-06-08 VITALS — BP 126/64 | HR 72 | Wt 146.0 lb

## 2012-06-08 DIAGNOSIS — E119 Type 2 diabetes mellitus without complications: Secondary | ICD-10-CM

## 2012-06-08 DIAGNOSIS — E538 Deficiency of other specified B group vitamins: Secondary | ICD-10-CM

## 2012-06-08 DIAGNOSIS — E785 Hyperlipidemia, unspecified: Secondary | ICD-10-CM | POA: Diagnosis not present

## 2012-06-08 DIAGNOSIS — N189 Chronic kidney disease, unspecified: Secondary | ICD-10-CM | POA: Diagnosis not present

## 2012-06-08 DIAGNOSIS — D649 Anemia, unspecified: Secondary | ICD-10-CM

## 2012-06-08 LAB — ALT: ALT: 12 U/L (ref 0–35)

## 2012-06-08 LAB — LIPID PANEL
HDL: 71.3 mg/dL (ref >39.00)
LDL Cholesterol: 73 mg/dL (ref 0–99)
VLDL: 22.4 mg/dL (ref 0.0–40.0)

## 2012-06-08 LAB — BASIC METABOLIC PANEL
Chloride: 103 mEq/L (ref 96–112)
GFR: 32.97 mL/min — ABNORMAL LOW (ref >60.00)
Potassium: 4.7 mEq/L (ref 3.5–5.1)

## 2012-06-08 LAB — AST: AST: 14 U/L (ref 0–37)

## 2012-06-08 LAB — VITAMIN B12: Vitamin B-12: >1500 pg/mL — ABNORMAL HIGH (ref 211–911)

## 2012-06-08 LAB — HEMOGLOBIN: Hemoglobin: 10.6 g/dL — ABNORMAL LOW (ref 12.0–15.0)

## 2012-06-08 LAB — HEMOGLOBIN A1C: Hgb A1c MFr Bld: 9.3 % — ABNORMAL HIGH (ref 4.6–6.5)

## 2012-06-08 NOTE — Progress Notes (Signed)
  Subjective:    Patient ID: Jeanne Barker, female    DOB: 1923/07/23, 79 y.o.   MRN: [id number]  HPI ROV DM-- good med compliance, check CBGs sometimes but has no readings with her. CRI-- due for labs Reports a h/o B12 def, on suplements Hyperlipidemia-- good med compliance, due for labs    Past Medical History  Diagnosis Date  . Anemia   . Diabetes mellitus   . Hyperlipidemia   . Chronic renal insufficiency   . Hard of hearing   . Syphilis     ?latent?, treated c/ PCN 1993  . Osteoarthritis     Past Surgical History  Procedure Laterality Date  . Cochlear impalnt  1/02  . Abdominal hysterectomy      BSO  . Nephrectomy      left  . Oophorectomy    . Cataract extraction      Social History: lives in her house w/ a son cames to the office w/ Kirt Boys (daughter) still works 4h a day (cleaning) tobacco--quit years ago ETOH-- never   still drives   Review of Systems Patient states she is doing great, has no concerns. Still independent, still drives and does some work. No CP-SOB No N-V-D Per  patient and her daughter not issues with memory.    Objective:   Physical Exam General -- alert, well-developed  Lungs -- normal respiratory effort, no intercostal retractions, no accessory muscle use, and normal breath sounds.   Heart-- normal rate, regular rhythm, no murmur, and no gallop.   Abdomen--soft, non-tender, no distention, no masses   Extremities-- trace  pretibial edema bilaterally, L>R  Neurologic-- alert & oriented  To time-space-self  Psych-- Cognition and judgment appear intact. Alert and cooperative with normal attention span and concentration.  not anxious appearing and not depressed appearing.        Assessment & Plan:   Declined a Tdap Fall prevention discussed

## 2012-06-08 NOTE — Assessment & Plan Note (Signed)
Due for labs

## 2012-06-08 NOTE — Assessment & Plan Note (Signed)
H/o anemia and b12 def, on supplements, labs

## 2012-06-08 NOTE — Assessment & Plan Note (Signed)
Reports good med compliance, see previous entry, labs

## 2012-06-08 NOTE — Patient Instructions (Addendum)

## 2012-06-08 NOTE — Assessment & Plan Note (Signed)
labs

## 2012-06-25 ENCOUNTER — Other Ambulatory Visit: Payer: Self-pay | Admitting: Internal Medicine

## 2012-07-04 ENCOUNTER — Ambulatory Visit (INDEPENDENT_AMBULATORY_CARE_PROVIDER_SITE_OTHER): Payer: Medicare Other | Admitting: Internal Medicine

## 2012-07-04 ENCOUNTER — Encounter: Payer: Self-pay | Admitting: Internal Medicine

## 2012-07-04 VITALS — BP 140/68 | HR 72 | Temp 98.0°F | Wt 146.0 lb

## 2012-07-04 DIAGNOSIS — E119 Type 2 diabetes mellitus without complications: Secondary | ICD-10-CM

## 2012-07-04 DIAGNOSIS — N189 Chronic kidney disease, unspecified: Secondary | ICD-10-CM

## 2012-07-04 MED ORDER — FUROSEMIDE 20 MG PO TABS: ORAL_TABLET | ORAL | Status: DC

## 2012-07-04 MED ORDER — SIMVASTATIN 40 MG PO TABS: ORAL_TABLET | ORAL | Status: DC

## 2012-07-04 MED ORDER — SITAGLIPTIN PHOSPHATE 50 MG PO TABS: ORAL_TABLET | ORAL | Status: DC

## 2012-07-04 MED ORDER — GLIMEPIRIDE 2 MG PO TABS: ORAL_TABLET | ORAL | Status: DC

## 2012-07-04 MED ORDER — CLONIDINE HCL 0.1 MG PO TABS: ORAL_TABLET | ORAL | Status: DC

## 2012-07-04 NOTE — Assessment & Plan Note (Signed)
D/w the fact that her A1C is elevated, explained she could get very sick, we discussed options including lantus or acarbose. She is completely oppose to take more medication, particulalrly injectables, she doesn't like to check CBGs often either . I don't think increasing amaryl will will be too beneficial and will increase risk of low CBGs. We agreed to cont same meds, sx of hyperglycemia discussed

## 2012-07-04 NOTE — Progress Notes (Signed)
  Subjective:    Patient ID: Jeanne Barker, female    DOB: [birth year], 79 y.o.   MRN: [id number]  HPI Here to discuss her recent A1C  Past Medical History  Diagnosis Date  . Anemia   . Diabetes mellitus   . Hyperlipidemia   . Chronic renal insufficiency   . Hard of hearing   . Syphilis     ?latent?, treated c/ PCN 1993  . Osteoarthritis    Past Surgical History  Procedure Laterality Date  . Cochlear impalnt  1/02  . Abdominal hysterectomy      BSO  . Nephrectomy      left  . Oophorectomy    . Cataract extraction       Review of Systems States is feeling great    Objective:   Physical Exam  A Ox3, NAD      Assessment & Plan:    Today , I spent more than 15 min with the patient, >50% of the time counseling

## 2012-07-04 NOTE — Patient Instructions (Addendum)

## 2012-07-04 NOTE — Assessment & Plan Note (Signed)
CRI-- calculated Cr clearence 22

## 2012-08-01 ENCOUNTER — Other Ambulatory Visit: Payer: Self-pay | Admitting: Internal Medicine

## 2012-08-03 NOTE — Telephone Encounter (Signed)
Spoke to pharmacy, pt still has refills left on file. Refill request sent in error.  

## 2012-08-23 ENCOUNTER — Other Ambulatory Visit: Payer: Self-pay | Admitting: Internal Medicine

## 2012-08-23 NOTE — Telephone Encounter (Signed)
Spoke to pharmacy, pt still has refills left on file. Refill request sent in error.  

## 2012-09-04 ENCOUNTER — Other Ambulatory Visit: Payer: Self-pay | Admitting: Internal Medicine

## 2012-09-04 NOTE — Telephone Encounter (Signed)
Spoke to pharmacy, pt still has refills left on file. Refill request sent in error.  

## 2012-12-06 ENCOUNTER — Ambulatory Visit (INDEPENDENT_AMBULATORY_CARE_PROVIDER_SITE_OTHER): Payer: Medicare Other | Admitting: Nurse Practitioner

## 2012-12-06 ENCOUNTER — Telehealth: Payer: Self-pay | Admitting: Nurse Practitioner

## 2012-12-06 ENCOUNTER — Ambulatory Visit: Payer: Medicare Other | Admitting: Internal Medicine

## 2012-12-06 ENCOUNTER — Encounter: Payer: Self-pay | Admitting: Nurse Practitioner

## 2012-12-06 VITALS — BP 110/48 | HR 58 | Temp 98.1°F | Ht 61.0 in | Wt 147.0 lb

## 2012-12-06 DIAGNOSIS — D649 Anemia, unspecified: Secondary | ICD-10-CM

## 2012-12-06 DIAGNOSIS — E119 Type 2 diabetes mellitus without complications: Secondary | ICD-10-CM

## 2012-12-06 DIAGNOSIS — E785 Hyperlipidemia, unspecified: Secondary | ICD-10-CM

## 2012-12-06 DIAGNOSIS — N289 Disorder of kidney and ureter, unspecified: Secondary | ICD-10-CM | POA: Diagnosis not present

## 2012-12-06 DIAGNOSIS — M1710 Unilateral primary osteoarthritis, unspecified knee: Secondary | ICD-10-CM

## 2012-12-06 DIAGNOSIS — M1711 Unilateral primary osteoarthritis, right knee: Secondary | ICD-10-CM

## 2012-12-06 LAB — CBC
Hemoglobin: 9.7 g/dL — ABNORMAL LOW (ref 12.0–15.0)
MCHC: 32.8 g/dL (ref 30.0–36.0)
MCV: 82.2 fl (ref 78.0–100.0)
RDW: 13.8 % (ref 11.5–14.6)

## 2012-12-06 LAB — HEMOGLOBIN A1C: Hgb A1c MFr Bld: 8.9 % — ABNORMAL HIGH (ref 4.6–6.5)

## 2012-12-06 MED ORDER — TROLAMINE SALICYLATE 10 % EX CREA: TOPICAL_CREAM | CUTANEOUS | Status: DC | PRN

## 2012-12-06 NOTE — Progress Notes (Signed)
Subjective:     Jeanne Barker is an 79 y.o. female who presents for follow up of diabetes, anemia, CRI, osteoarthritis, & hyperlipidemia. Re DMII:  Current symptoms include: none. Patient denies foot ulcerations, hyperglycemia, hypoglycemia , increased appetite, nausea, paresthesia of the feet, polydipsia, polyuria, visual disturbances, vomiting and weight loss. Evaluation to date has included: hemoglobin A1C. Home sugars: patient does not check sugars. Current treatments: januvia, amaryl.  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  The following portions of the patient's history were reviewed and updated as appropriate: allergies, current medications, past medical history, past social history and problem list.  Review of Systems Constitutional: negative for fatigue, fevers, night sweats and weight loss Respiratory: negative for cough and sputum Cardiovascular: negative for chest pain, chest pressure/discomfort, irregular heart beat, syncope and continues to have mild edema Gastrointestinal: negative for abdominal pain, change in bowel habits, dyspepsia and nausea Musculoskeletal:positive for arthralgias Endocrine: negative for diabetic symptoms including blurry vision, polydipsia, polyphagia and polyuria    Objective:    BP 110/48  Pulse 58  Temp(Src) 98.1 F (36.7 C) (Oral)  Ht 5\' 1"  (1.549 m)  Wt 147 lb (66.679 kg)  BMI 27.79 kg/m2  SpO2 98% General appearance: alert, cooperative, appears stated age and no distress Head: Normocephalic, without obvious abnormality, atraumatic Eyes: negative findings: lids and lashes normal, conjunctivae and sclerae normal and wearing glasses Lungs: clear to auscultation bilaterally Heart: regular rate and  rhythm, S1, S2 normal, no murmur, click, rub or gallop Extremities: edema +1 pitting edema to ankles Pulses: 2+ and symmetric Skin: Skin color, texture, turgor normal. No rashes or lesions  Laboratory:  See orders   Assessment:   1 HTN, well controlled, mild LE edema 2 DM II, last HgbA1C was elevated, but pt did not want to change meds, still does not want to change-says she feels good. 3 anemia NOS 4 osteoarthritis c/o R knee pain w/cloudy weather since had shingles in R leg   Plan:   1 Continue catapress 2 labs today, continue meds 3 labs today 4 Start aspercream as needed. May continue with tylenol.

## 2012-12-06 NOTE — Telephone Encounter (Signed)
Needs 3 mo f/u

## 2012-12-06 NOTE — Patient Instructions (Addendum)
Our office will call you with results.  Please start using aspercream on R knee when you have pain. You may also use tylenol. Remember to get your flu shot.  You look great! Pleasure to meet you.

## 2012-12-07 ENCOUNTER — Telehealth: Payer: Self-pay | Admitting: *Deleted

## 2012-12-07 LAB — LIPID PANEL
HDL: 66 mg/dL (ref >39.00)
Total CHOL/HDL Ratio: 3

## 2012-12-07 LAB — BASIC METABOLIC PANEL
Calcium: 9.2 mg/dL (ref 8.4–10.5)
Creatinine, Ser: 1.7 mg/dL — ABNORMAL HIGH (ref 0.4–1.2)
GFR: 37.04 mL/min — ABNORMAL LOW (ref >60.00)
Glucose, Bld: 97 mg/dL (ref 70–99)
Sodium: 132 mEq/L — ABNORMAL LOW (ref 135–145)

## 2012-12-07 NOTE — Telephone Encounter (Signed)
Message copied by Eustace Quail on Fri Dec 07, 2012  4:53 PM ------      Message from: [name] E      Created: Fri Dec 07, 2012 12:44 PM       (Na+ maybe slt low due to diuretics)            Advise patient      Labs are satisfactory, please come back in 3-4 months for a checkup. ------

## 2012-12-07 NOTE — Telephone Encounter (Signed)
Pt notified via telephone of lab results and to schedule an check-up in 3-4 months. DJR

## 2012-12-21 ENCOUNTER — Telehealth: Payer: Self-pay | Admitting: *Deleted

## 2012-12-21 ENCOUNTER — Encounter: Payer: Self-pay | Admitting: *Deleted

## 2012-12-21 NOTE — Telephone Encounter (Signed)
Spoke with pts daughter and advised of recent lab results. Dtr states follow up appt has already been made. Letter sent with results.

## 2012-12-21 NOTE — Telephone Encounter (Signed)
Message copied by Baldwin [place] on Fri Dec 21, 2012  8:23 AM ------      Message from: [name] E      Created: Fri Dec 07, 2012 12:44 PM       (Na+ maybe slt low due to diuretics)            Advise patient      Labs are satisfactory, please come back in 3-4 months for a checkup. ------

## 2013-01-10 ENCOUNTER — Other Ambulatory Visit: Payer: Self-pay | Admitting: Internal Medicine

## 2013-01-10 NOTE — Telephone Encounter (Signed)
rx refilled per protocol. DJR  

## 2013-02-27 DIAGNOSIS — Z9689 Presence of other specified functional implants: Secondary | ICD-10-CM | POA: Diagnosis not present

## 2013-02-27 DIAGNOSIS — Z462 Encounter for fitting and adjustment of other devices related to nervous system and special senses: Secondary | ICD-10-CM | POA: Diagnosis not present

## 2013-02-27 DIAGNOSIS — H903 Sensorineural hearing loss, bilateral: Secondary | ICD-10-CM | POA: Diagnosis not present

## 2013-02-28 ENCOUNTER — Other Ambulatory Visit: Payer: Self-pay | Admitting: Internal Medicine

## 2013-02-28 NOTE — Telephone Encounter (Signed)
Furosemde and Januvia refilled per protocol

## 2013-03-15 ENCOUNTER — Other Ambulatory Visit: Payer: Self-pay | Admitting: Internal Medicine

## 2013-03-15 NOTE — Telephone Encounter (Signed)
rx refilled per protocol. DJR  

## 2013-04-12 ENCOUNTER — Other Ambulatory Visit: Payer: Self-pay | Admitting: Internal Medicine

## 2013-04-12 NOTE — Telephone Encounter (Signed)
rx refilled per protocol. DJR  

## 2013-06-10 ENCOUNTER — Ambulatory Visit: Payer: Medicare Other | Admitting: Internal Medicine

## 2013-06-17 ENCOUNTER — Ambulatory Visit (INDEPENDENT_AMBULATORY_CARE_PROVIDER_SITE_OTHER): Payer: Medicare Other | Admitting: Internal Medicine

## 2013-06-17 ENCOUNTER — Encounter: Payer: Self-pay | Admitting: Internal Medicine

## 2013-06-17 VITALS — BP 109/58 | HR 71 | Temp 98.1°F | Wt 138.0 lb

## 2013-06-17 DIAGNOSIS — N189 Chronic kidney disease, unspecified: Secondary | ICD-10-CM | POA: Diagnosis not present

## 2013-06-17 DIAGNOSIS — Z0000 Encounter for general adult medical examination without abnormal findings: Secondary | ICD-10-CM

## 2013-06-17 DIAGNOSIS — E119 Type 2 diabetes mellitus without complications: Secondary | ICD-10-CM

## 2013-06-17 DIAGNOSIS — S20219A Contusion of unspecified front wall of thorax, initial encounter: Secondary | ICD-10-CM

## 2013-06-17 DIAGNOSIS — D649 Anemia, unspecified: Secondary | ICD-10-CM

## 2013-06-17 LAB — CBC WITH DIFFERENTIAL/PLATELET
BASOS ABS: 0 10*3/uL (ref 0.0–0.1)
BASOS PCT: 0.6 % (ref 0.0–3.0)
EOS ABS: 0.1 10*3/uL (ref 0.0–0.7)
Eosinophils Relative: 1.1 % (ref 0.0–5.0)
HEMATOCRIT: 30.1 % — AB (ref 36.0–46.0)
HEMOGLOBIN: 9.7 g/dL — AB (ref 12.0–15.0)
LYMPHS ABS: 1.4 10*3/uL (ref 0.7–4.0)
Lymphocytes Relative: 22.1 % (ref 12.0–46.0)
MCHC: 32.3 g/dL (ref 30.0–36.0)
MCV: 84.3 fl (ref 78.0–100.0)
MONO ABS: 0.5 10*3/uL (ref 0.1–1.0)
Monocytes Relative: 7.8 % (ref 3.0–12.0)
NEUTROS ABS: 4.4 10*3/uL (ref 1.4–7.7)
Neutrophils Relative %: 68.4 % (ref 43.0–77.0)
Platelets: 388 10*3/uL (ref 150.0–400.0)
RBC: 3.57 Mil/uL — AB (ref 3.87–5.11)
RDW: 13.6 % (ref 11.5–14.6)
WBC: 6.5 10*3/uL (ref 4.5–10.5)

## 2013-06-17 LAB — HEMOGLOBIN A1C: HEMOGLOBIN A1C: 8.9 % — AB (ref 4.6–6.5)

## 2013-06-17 LAB — BASIC METABOLIC PANEL
BUN: 35 mg/dL — ABNORMAL HIGH (ref 6–23)
CALCIUM: 9.3 mg/dL (ref 8.4–10.5)
CO2: 24 mEq/L (ref 19–32)
CREATININE: 1.8 mg/dL — AB (ref 0.4–1.2)
Chloride: 103 mEq/L (ref 96–112)
GFR: 34.38 mL/min — ABNORMAL LOW (ref >60.00)
Glucose, Bld: 231 mg/dL — ABNORMAL HIGH (ref 70–99)
Potassium: 4.7 mEq/L (ref 3.5–5.1)
Sodium: 136 mEq/L (ref 135–145)

## 2013-06-17 NOTE — Assessment & Plan Note (Addendum)
Due for labs, she states takes her CBGs but has no  readings for my review today

## 2013-06-17 NOTE — Patient Instructions (Signed)
Get your blood work before you leave   Next visit is for routine check up regards your blood sugar ,  in 6 months ,  fasting Please make an appointment    If your blood sugars are consistently more than 200, let me know  Tylenol  500 mg OTC 2 tabs a day every 8 hours as needed for pain AVOID Motrin, Advil, naproxen, ibuprofen or similar medications

## 2013-06-17 NOTE — Assessment & Plan Note (Signed)
Labs

## 2013-06-17 NOTE — Assessment & Plan Note (Signed)
Declined Td or  pneumonia shot
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
## 2013-06-19 ENCOUNTER — Encounter: Payer: Self-pay | Admitting: *Deleted

## 2013-06-20 ENCOUNTER — Telehealth: Payer: Self-pay

## 2013-06-20 NOTE — Telephone Encounter (Signed)
Relevant patient education assigned to patient using Emmi. ° °

## 2013-06-21 ENCOUNTER — Other Ambulatory Visit: Payer: Self-pay | Admitting: Internal Medicine

## 2013-07-12 ENCOUNTER — Other Ambulatory Visit: Payer: Self-pay | Admitting: Internal Medicine

## 2013-07-22 ENCOUNTER — Other Ambulatory Visit: Payer: Self-pay | Admitting: *Deleted

## 2013-07-22 MED ORDER — GLIMEPIRIDE 2 MG PO TABS: ORAL_TABLET | ORAL | Status: DC

## 2013-08-15 DIAGNOSIS — H905 Unspecified sensorineural hearing loss: Secondary | ICD-10-CM | POA: Diagnosis not present

## 2013-08-15 DIAGNOSIS — Z462 Encounter for fitting and adjustment of other devices related to nervous system and special senses: Secondary | ICD-10-CM | POA: Diagnosis not present

## 2013-08-21 DIAGNOSIS — H903 Sensorineural hearing loss, bilateral: Secondary | ICD-10-CM | POA: Diagnosis not present

## 2013-08-23 ENCOUNTER — Other Ambulatory Visit: Payer: Self-pay | Admitting: Internal Medicine

## 2013-12-13 ENCOUNTER — Other Ambulatory Visit: Payer: Self-pay | Admitting: Internal Medicine

## 2013-12-18 ENCOUNTER — Encounter: Payer: Self-pay | Admitting: Internal Medicine

## 2013-12-18 ENCOUNTER — Ambulatory Visit (INDEPENDENT_AMBULATORY_CARE_PROVIDER_SITE_OTHER): Payer: Medicare Other | Admitting: Internal Medicine

## 2013-12-18 VITALS — BP 128/72 | HR 66 | Temp 98.2°F | Wt 147.0 lb

## 2013-12-18 DIAGNOSIS — E785 Hyperlipidemia, unspecified: Secondary | ICD-10-CM

## 2013-12-18 DIAGNOSIS — E119 Type 2 diabetes mellitus without complications: Secondary | ICD-10-CM

## 2013-12-18 DIAGNOSIS — D649 Anemia, unspecified: Secondary | ICD-10-CM

## 2013-12-18 DIAGNOSIS — N189 Chronic kidney disease, unspecified: Secondary | ICD-10-CM | POA: Diagnosis not present

## 2013-12-18 DIAGNOSIS — Z0000 Encounter for general adult medical examination without abnormal findings: Secondary | ICD-10-CM

## 2013-12-18 LAB — CBC WITH DIFFERENTIAL/PLATELET
BASOS ABS: 0 10*3/uL (ref 0.0–0.1)
Basophils Relative: 0.4 % (ref 0.0–3.0)
EOS ABS: 0.1 10*3/uL (ref 0.0–0.7)
Eosinophils Relative: 1.6 % (ref 0.0–5.0)
HEMATOCRIT: 32.4 % — AB (ref 36.0–46.0)
Hemoglobin: 10.6 g/dL — ABNORMAL LOW (ref 12.0–15.0)
LYMPHS ABS: 1.8 10*3/uL (ref 0.7–4.0)
Lymphocytes Relative: 30.8 % (ref 12.0–46.0)
MCHC: 32.6 g/dL (ref 30.0–36.0)
MCV: 83.4 fl (ref 78.0–100.0)
Monocytes Absolute: 0.5 10*3/uL (ref 0.1–1.0)
Monocytes Relative: 9 % (ref 3.0–12.0)
Neutro Abs: 3.4 10*3/uL (ref 1.4–7.7)
Neutrophils Relative %: 58.2 % (ref 43.0–77.0)
Platelets: 371 10*3/uL (ref 150.0–400.0)
RBC: 3.89 Mil/uL (ref 3.87–5.11)
RDW: 13.8 % (ref 11.5–15.5)
WBC: 5.8 10*3/uL (ref 4.0–10.5)

## 2013-12-18 LAB — BASIC METABOLIC PANEL
BUN: 28 mg/dL — ABNORMAL HIGH (ref 6–23)
CHLORIDE: 105 meq/L (ref 96–112)
CO2: 25 meq/L (ref 19–32)
CREATININE: 1.8 mg/dL — AB (ref 0.4–1.2)
Calcium: 9.6 mg/dL (ref 8.4–10.5)
GFR: 33.27 mL/min — ABNORMAL LOW (ref >60.00)
GLUCOSE: 77 mg/dL (ref 70–99)
Potassium: 4.5 mEq/L (ref 3.5–5.1)
SODIUM: 139 meq/L (ref 135–145)

## 2013-12-18 LAB — LIPID PANEL
CHOLESTEROL: 178 mg/dL (ref 0–200)
HDL: 72 mg/dL (ref >39.00)
LDL Cholesterol: 75 mg/dL (ref 0–99)
NonHDL: 106
Total CHOL/HDL Ratio: 2
Triglycerides: 156 mg/dL — ABNORMAL HIGH (ref 0.0–149.0)
VLDL: 31.2 mg/dL (ref 0.0–40.0)

## 2013-12-18 LAB — ALT: ALT: 11 U/L (ref 0–35)

## 2013-12-18 LAB — HEMOGLOBIN A1C: Hgb A1c MFr Bld: 8.3 % — ABNORMAL HIGH (ref 4.6–6.5)

## 2013-12-18 LAB — AST: AST: 14 U/L (ref 0–37)

## 2013-12-18 NOTE — Assessment & Plan Note (Signed)
We'll monitor her   hemoglobin today

## 2013-12-18 NOTE — Assessment & Plan Note (Signed)
Good compliance with cholesterol medication, check FLP

## 2013-12-18 NOTE — Assessment & Plan Note (Signed)
See previous entries, she is opposed to take more medication for diabetes. She sees the eye MD regularly, feet exam negative today Plan: Continue with present meds, check the A1c

## 2013-12-18 NOTE — Progress Notes (Signed)
Pre visit review using our clinic review tool, if applicable. No additional management support is needed unless otherwise documented below in the visit note. 
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
## 2013-12-18 NOTE — Assessment & Plan Note (Signed)
Declined a flu shot, states will get it at the pharmacy

## 2013-12-18 NOTE — Patient Instructions (Signed)
Get your blood work before you leave     Please come back to the office for a routine visit routine in 4 months,   fasting Stop by the front desk and schedule the visit

## 2013-12-18 NOTE — Assessment & Plan Note (Signed)
Check a BMP

## 2014-01-11 ENCOUNTER — Other Ambulatory Visit: Payer: Self-pay | Admitting: Internal Medicine

## 2014-02-18 ENCOUNTER — Other Ambulatory Visit: Payer: Self-pay | Admitting: Internal Medicine

## 2014-02-20 ENCOUNTER — Other Ambulatory Visit: Payer: Self-pay | Admitting: Internal Medicine

## 2014-03-03 ENCOUNTER — Other Ambulatory Visit: Payer: Self-pay

## 2014-03-03 DIAGNOSIS — H903 Sensorineural hearing loss, bilateral: Secondary | ICD-10-CM | POA: Diagnosis not present

## 2014-03-03 DIAGNOSIS — Z45321 Encounter for adjustment and management of cochlear device: Secondary | ICD-10-CM | POA: Diagnosis not present

## 2014-03-20 ENCOUNTER — Other Ambulatory Visit: Payer: Self-pay | Admitting: Internal Medicine

## 2014-04-06 ENCOUNTER — Other Ambulatory Visit: Payer: Self-pay | Admitting: Internal Medicine

## 2014-04-22 ENCOUNTER — Encounter: Payer: Self-pay | Admitting: Internal Medicine

## 2014-04-22 ENCOUNTER — Ambulatory Visit (INDEPENDENT_AMBULATORY_CARE_PROVIDER_SITE_OTHER): Payer: Medicare Other | Admitting: Internal Medicine

## 2014-04-22 VITALS — BP 151/72 | HR 75 | Temp 97.9°F | Ht 61.0 in | Wt 145.4 lb

## 2014-04-22 DIAGNOSIS — E119 Type 2 diabetes mellitus without complications: Secondary | ICD-10-CM | POA: Diagnosis not present

## 2014-04-22 DIAGNOSIS — N189 Chronic kidney disease, unspecified: Secondary | ICD-10-CM | POA: Diagnosis not present

## 2014-04-22 DIAGNOSIS — Z0000 Encounter for general adult medical examination without abnormal findings: Secondary | ICD-10-CM

## 2014-04-22 LAB — BASIC METABOLIC PANEL
BUN: 29 mg/dL — AB (ref 6–23)
CHLORIDE: 102 meq/L (ref 96–112)
CO2: 26 mEq/L (ref 19–32)
CREATININE: 1.7 mg/dL — AB (ref 0.4–1.2)
Calcium: 9.5 mg/dL (ref 8.4–10.5)
GFR: 35.69 mL/min — ABNORMAL LOW (ref >60.00)
Glucose, Bld: 143 mg/dL — ABNORMAL HIGH (ref 70–99)
Potassium: 4.9 mEq/L (ref 3.5–5.1)
Sodium: 132 mEq/L — ABNORMAL LOW (ref 135–145)

## 2014-04-22 LAB — HEMOGLOBIN A1C: Hgb A1c MFr Bld: 8.6 % — ABNORMAL HIGH (ref 4.6–6.5)

## 2014-04-22 NOTE — Patient Instructions (Signed)
Get your blood work before you leave   Check the  blood pressure 2 or 3 times a month   Be sure your blood pressure is between 110/65 and  145/85.  if it is consistently higher or lower, let me know  Diabetes: Check your blood sugar  3-4 times a week  GOALS: Fasting before a meal 70- 130 2 hours after a meal less than 180 At bedtime 90-150 Call if consistently not at goal    Please come back to the office in 4 to 6 months  for a routine check up   Come back fasting

## 2014-04-22 NOTE — Assessment & Plan Note (Signed)
Check a BMP today, next visit in 4-6 months

## 2014-04-22 NOTE — Assessment & Plan Note (Signed)
Td 2003 Had a flu shot for this year Pneumonia shot --2003 Prevnar discussed, declined.

## 2014-04-22 NOTE — Assessment & Plan Note (Signed)
Good compliance with medication, continue Januvia and Amaryl. CBG goals provided. Check a A1c, follow-up 4-6 months

## 2014-04-22 NOTE — Progress Notes (Signed)
Pre visit review using our clinic review tool, if applicable. No additional management support is needed unless otherwise documented below in the visit note. 

## 2014-04-22 NOTE — Progress Notes (Signed)
Subjective:    Patient ID: Jeanne Barker, female    DOB: 09/16/1923, 81 y.o.   MRN: [id number]  DOS:  04/22/2014 Type of visit - description : rov, here w/ Jeanne Barker Interval history: Since the last office visit she is doing well. Good compliance with medications Still working part-time Checks her blood sugar and blood pressure regularly, states she gets good readings but could not tell me any numbers.    ROS Denies chest pain difficulty breathing No nausea, vomiting, diarrhea. No anxiety  depression  Past Medical History  Diagnosis Date  . Anemia   . Diabetes mellitus   . Hyperlipidemia   . Chronic renal insufficiency   . Hard of hearing   . Syphilis     ?latent?, treated c/ PCN 1993  . Osteoarthritis     Past Surgical History  Procedure Laterality Date  . Cochlear impalnt  1/02  . Abdominal hysterectomy      BSO  . Nephrectomy      left  . Oophorectomy    . Cataract extraction      History   Social History  . Marital Status: Single    Spouse Name: N/A    Number of Children: 5  . Years of Education: N/A   Occupational History  . PT job   .     Social History Main Topics  . Smoking status: Former Games developermoker  . Smokeless tobacco: Never Used  . Alcohol Use: No  . Drug Use: Not on file  . Sexual Activity: Not on file   Other Topics Concern  . Not on file   Social History Narrative   Son lives with her , still drives.   Her daughter Jeanne Barker [phone])  lives 20 minutes away and usually comes w/ the pt to the office visit        Medication List       This list is accurate as of: 04/22/14  5:55 PM.  Always use your most recent med list.               acetaminophen 500 MG tablet  Commonly known as:  TYLENOL  Take 500 mg by mouth every 6 (six) hours as needed.     aspirin 81 MG tablet  Take 81 mg by mouth daily.     cloNIDine 0.1 MG tablet  Commonly known as:  CATAPRES  TAKE 1 TABLET BY MOUTH TWICE DAILY     furosemide 20 MG tablet  Commonly  known as:  LASIX  TAKE 1 TABLET BY MOUTH DAILY     glimepiride 2 MG tablet  Commonly known as:  AMARYL  TAKE 2 TABLETS BY MOUTH DAILY     glucose blood test strip  1 each by Other route as needed. Use as instructed     JANUVIA 50 MG tablet  Generic drug:  sitaGLIPtin  TAKE 1 TABLET BY MOUTH DAILY     ONE TOUCH LANCETS Misc  by Does not apply route.     simvastatin 40 MG tablet  Commonly known as:  ZOCOR  TAKE 1 TABLET BY MOUTH EVERY NIGHT AT BEDTIME     trolamine salicylate 10 % cream  Commonly known as:  ASPERCREME  Apply topically as needed.     vitamin B-12 100 MCG tablet  Commonly known as:  CYANOCOBALAMIN  Take 100 mcg by mouth daily.           Objective:   Physical Exam BP 151/72 mmHg  Pulse  75  Temp(Src) 97.9 F (36.6 C) (Oral)  Ht  (1.549 m)  Wt 145 lb 6 oz (65.942 kg)  BMI 27.48 kg/m2  SpO2 97% General -- alert, well-developed, NAD.   Lungs -- normal respiratory effort, no intercostal retractions, no accessory muscle use, and normal breath sounds.  Heart-- normal rate, regular rhythm, no murmur.  Extremities-- trace pretibial edema bilaterally  Neurologic--  alert & oriented X3. Speech normal, gait appropriate for age, strength symmetric and appropriate for age.  Psych-- Cognition and judgment appear intact. Cooperative with normal attention span and concentration. No anxious or depressed appearing.     Assessment & Plan:

## 2014-06-18 ENCOUNTER — Emergency Department (HOSPITAL_COMMUNITY): Payer: Medicare Other

## 2014-06-18 ENCOUNTER — Inpatient Hospital Stay (HOSPITAL_COMMUNITY)
Admission: EM | Admit: 2014-06-18 | Discharge: 2014-06-21 | DRG: 638 | Disposition: A | Discharge status: Home / Self Care | Payer: Medicare Other | Attending: Internal Medicine | Admitting: Internal Medicine

## 2014-06-18 ENCOUNTER — Encounter (HOSPITAL_COMMUNITY): Payer: Self-pay

## 2014-06-18 DIAGNOSIS — Z01818 Encounter for other preprocedural examination: Secondary | ICD-10-CM

## 2014-06-18 DIAGNOSIS — Z9849 Cataract extraction status, unspecified eye: Secondary | ICD-10-CM

## 2014-06-18 DIAGNOSIS — E162 Hypoglycemia, unspecified: Secondary | ICD-10-CM | POA: Diagnosis present

## 2014-06-18 DIAGNOSIS — H9190 Unspecified hearing loss, unspecified ear: Secondary | ICD-10-CM | POA: Diagnosis present

## 2014-06-18 DIAGNOSIS — Z7982 Long term (current) use of aspirin: Secondary | ICD-10-CM

## 2014-06-18 DIAGNOSIS — S8991XA Unspecified injury of right lower leg, initial encounter: Secondary | ICD-10-CM | POA: Diagnosis not present

## 2014-06-18 DIAGNOSIS — Z90710 Acquired absence of both cervix and uterus: Secondary | ICD-10-CM

## 2014-06-18 DIAGNOSIS — E871 Hypo-osmolality and hyponatremia: Secondary | ICD-10-CM | POA: Diagnosis present

## 2014-06-18 DIAGNOSIS — E11649 Type 2 diabetes mellitus with hypoglycemia without coma: Secondary | ICD-10-CM | POA: Diagnosis present

## 2014-06-18 DIAGNOSIS — Z905 Acquired absence of kidney: Secondary | ICD-10-CM | POA: Diagnosis present

## 2014-06-18 DIAGNOSIS — T148 Other injury of unspecified body region: Secondary | ICD-10-CM | POA: Diagnosis not present

## 2014-06-18 DIAGNOSIS — M1990 Unspecified osteoarthritis, unspecified site: Secondary | ICD-10-CM | POA: Diagnosis present

## 2014-06-18 DIAGNOSIS — E785 Hyperlipidemia, unspecified: Secondary | ICD-10-CM | POA: Diagnosis present

## 2014-06-18 DIAGNOSIS — I10 Essential (primary) hypertension: Secondary | ICD-10-CM | POA: Diagnosis not present

## 2014-06-18 DIAGNOSIS — Y92009 Unspecified place in unspecified non-institutional (private) residence as the place of occurrence of the external cause: Secondary | ICD-10-CM

## 2014-06-18 DIAGNOSIS — D638 Anemia in other chronic diseases classified elsewhere: Secondary | ICD-10-CM | POA: Diagnosis present

## 2014-06-18 DIAGNOSIS — B9620 Unspecified Escherichia coli [E. coli] as the cause of diseases classified elsewhere: Secondary | ICD-10-CM | POA: Diagnosis present

## 2014-06-18 DIAGNOSIS — Z888 Allergy status to other drugs, medicaments and biological substances status: Secondary | ICD-10-CM | POA: Diagnosis not present

## 2014-06-18 DIAGNOSIS — R05 Cough: Secondary | ICD-10-CM | POA: Diagnosis not present

## 2014-06-18 DIAGNOSIS — N183 Chronic kidney disease, stage 3 (moderate): Secondary | ICD-10-CM | POA: Diagnosis present

## 2014-06-18 DIAGNOSIS — I129 Hypertensive chronic kidney disease with stage 1 through stage 4 chronic kidney disease, or unspecified chronic kidney disease: Secondary | ICD-10-CM | POA: Diagnosis present

## 2014-06-18 DIAGNOSIS — Z87891 Personal history of nicotine dependence: Secondary | ICD-10-CM

## 2014-06-18 DIAGNOSIS — G8929 Other chronic pain: Secondary | ICD-10-CM | POA: Diagnosis present

## 2014-06-18 DIAGNOSIS — B9689 Other specified bacterial agents as the cause of diseases classified elsewhere: Secondary | ICD-10-CM | POA: Diagnosis not present

## 2014-06-18 DIAGNOSIS — E1122 Type 2 diabetes mellitus with diabetic chronic kidney disease: Secondary | ICD-10-CM | POA: Diagnosis present

## 2014-06-18 DIAGNOSIS — N184 Chronic kidney disease, stage 4 (severe): Secondary | ICD-10-CM | POA: Diagnosis present

## 2014-06-18 DIAGNOSIS — M25551 Pain in right hip: Secondary | ICD-10-CM | POA: Diagnosis not present

## 2014-06-18 DIAGNOSIS — N390 Urinary tract infection, site not specified: Secondary | ICD-10-CM | POA: Diagnosis present

## 2014-06-18 DIAGNOSIS — W19XXXA Unspecified fall, initial encounter: Secondary | ICD-10-CM | POA: Diagnosis present

## 2014-06-18 DIAGNOSIS — E119 Type 2 diabetes mellitus without complications: Secondary | ICD-10-CM

## 2014-06-18 DIAGNOSIS — R9431 Abnormal electrocardiogram [ECG] [EKG]: Secondary | ICD-10-CM | POA: Diagnosis not present

## 2014-06-18 DIAGNOSIS — M25561 Pain in right knee: Secondary | ICD-10-CM | POA: Diagnosis not present

## 2014-06-18 DIAGNOSIS — S79911A Unspecified injury of right hip, initial encounter: Secondary | ICD-10-CM | POA: Diagnosis not present

## 2014-06-18 HISTORY — DX: Essential (primary) hypertension: I10

## 2014-06-18 LAB — CBC WITH DIFFERENTIAL/PLATELET
Basophils Absolute: 0 10*3/uL (ref 0.0–0.1)
Basophils Relative: 0 % (ref 0–1)
Eosinophils Absolute: 0 10*3/uL (ref 0.0–0.7)
Eosinophils Relative: 0 % (ref 0–5)
HCT: 32.3 % — ABNORMAL LOW (ref 36.0–46.0)
Hemoglobin: 10.7 g/dL — ABNORMAL LOW (ref 12.0–15.0)
Lymphocytes Relative: 7 % — ABNORMAL LOW (ref 12–46)
Lymphs Abs: 0.6 10*3/uL — ABNORMAL LOW (ref 0.7–4.0)
MCH: 27 pg (ref 26.0–34.0)
MCHC: 33.1 g/dL (ref 30.0–36.0)
MCV: 81.4 fL (ref 78.0–100.0)
Monocytes Absolute: 1.1 10*3/uL — ABNORMAL HIGH (ref 0.1–1.0)
Monocytes Relative: 13 % — ABNORMAL HIGH (ref 3–12)
Neutro Abs: 6.8 10*3/uL (ref 1.7–7.7)
Neutrophils Relative %: 80 % — ABNORMAL HIGH (ref 43–77)
Platelets: 269 10*3/uL (ref 150–400)
RBC: 3.97 MIL/uL (ref 3.87–5.11)
RDW: 12.9 % (ref 11.5–15.5)
WBC: 8.5 10*3/uL (ref 4.0–10.5)

## 2014-06-18 LAB — BASIC METABOLIC PANEL
Anion gap: 9 (ref 5–15)
BUN: 31 mg/dL — ABNORMAL HIGH (ref 6–23)
CO2: 23 mmol/L (ref 19–32)
Calcium: 9 mg/dL (ref 8.4–10.5)
Chloride: 99 mmol/L (ref 96–112)
Creatinine, Ser: 1.82 mg/dL — ABNORMAL HIGH (ref 0.50–1.10)
GFR calc Af Amer: 27 mL/min — ABNORMAL LOW (ref >90)
GFR calc non Af Amer: 24 mL/min — ABNORMAL LOW (ref >90)
Glucose, Bld: 85 mg/dL (ref 70–99)
Potassium: 4.4 mmol/L (ref 3.5–5.1)
Sodium: 131 mmol/L — ABNORMAL LOW (ref 135–145)

## 2014-06-18 LAB — CBG MONITORING, ED: Glucose-Capillary: 79 mg/dL (ref 70–99)

## 2014-06-18 LAB — URINALYSIS, ROUTINE W REFLEX MICROSCOPIC
Bilirubin Urine: NEGATIVE
Glucose, UA: 100 mg/dL — AB
Ketones, ur: NEGATIVE mg/dL
Nitrite: POSITIVE — AB
Protein, ur: 100 mg/dL — AB
Specific Gravity, Urine: 1.011 (ref 1.005–1.030)
Urobilinogen, UA: 0.2 mg/dL (ref 0.0–1.0)
pH: 5.5 (ref 5.0–8.0)

## 2014-06-18 LAB — URINE MICROSCOPIC-ADD ON

## 2014-06-18 MED ORDER — SODIUM CHLORIDE 0.9 % IV SOLN
INTRAVENOUS | Status: DC
  Administered 2014-06-18: 21:00:00 via INTRAVENOUS

## 2014-06-18 MED ORDER — FENTANYL CITRATE 0.05 MG/ML IJ SOLN
50.0000 ug | Freq: Once | INTRAMUSCULAR | Status: AC
  Administered 2014-06-18: 50 ug via INTRAVENOUS
  Filled 2014-06-18: qty 2

## 2014-06-18 MED ORDER — DEXTROSE 5 % IV SOLN
1.0000 g | Freq: Once | INTRAVENOUS | Status: AC
  Administered 2014-06-18: 1 g via INTRAVENOUS
  Filled 2014-06-18: qty 10

## 2014-06-18 NOTE — ED Notes (Signed)
Pt removed from back board with MD and PA present.
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
## 2014-06-18 NOTE — ED Notes (Signed)
C collar removed per MD 

## 2014-06-18 NOTE — ED Notes (Signed)
MD at bedside. 

## 2014-06-18 NOTE — ED Notes (Signed)
CBG 79 

## 2014-06-19 ENCOUNTER — Encounter (HOSPITAL_COMMUNITY): Payer: Self-pay | Admitting: *Deleted

## 2014-06-19 DIAGNOSIS — Z90710 Acquired absence of both cervix and uterus: Secondary | ICD-10-CM | POA: Diagnosis not present

## 2014-06-19 DIAGNOSIS — E1122 Type 2 diabetes mellitus with diabetic chronic kidney disease: Secondary | ICD-10-CM | POA: Diagnosis present

## 2014-06-19 DIAGNOSIS — N183 Chronic kidney disease, stage 3 (moderate): Secondary | ICD-10-CM | POA: Diagnosis not present

## 2014-06-19 DIAGNOSIS — D638 Anemia in other chronic diseases classified elsewhere: Secondary | ICD-10-CM | POA: Diagnosis present

## 2014-06-19 DIAGNOSIS — N390 Urinary tract infection, site not specified: Secondary | ICD-10-CM | POA: Diagnosis present

## 2014-06-19 DIAGNOSIS — Z9849 Cataract extraction status, unspecified eye: Secondary | ICD-10-CM | POA: Diagnosis not present

## 2014-06-19 DIAGNOSIS — Z888 Allergy status to other drugs, medicaments and biological substances status: Secondary | ICD-10-CM | POA: Diagnosis not present

## 2014-06-19 DIAGNOSIS — M25551 Pain in right hip: Secondary | ICD-10-CM | POA: Diagnosis not present

## 2014-06-19 DIAGNOSIS — I10 Essential (primary) hypertension: Secondary | ICD-10-CM

## 2014-06-19 DIAGNOSIS — E162 Hypoglycemia, unspecified: Secondary | ICD-10-CM | POA: Diagnosis not present

## 2014-06-19 DIAGNOSIS — S8991XA Unspecified injury of right lower leg, initial encounter: Secondary | ICD-10-CM | POA: Diagnosis not present

## 2014-06-19 DIAGNOSIS — M1990 Unspecified osteoarthritis, unspecified site: Secondary | ICD-10-CM | POA: Diagnosis present

## 2014-06-19 DIAGNOSIS — M25561 Pain in right knee: Secondary | ICD-10-CM | POA: Diagnosis not present

## 2014-06-19 DIAGNOSIS — Z7982 Long term (current) use of aspirin: Secondary | ICD-10-CM | POA: Diagnosis not present

## 2014-06-19 DIAGNOSIS — B9620 Unspecified Escherichia coli [E. coli] as the cause of diseases classified elsewhere: Secondary | ICD-10-CM | POA: Diagnosis not present

## 2014-06-19 DIAGNOSIS — E785 Hyperlipidemia, unspecified: Secondary | ICD-10-CM | POA: Diagnosis present

## 2014-06-19 DIAGNOSIS — S79911A Unspecified injury of right hip, initial encounter: Secondary | ICD-10-CM | POA: Diagnosis not present

## 2014-06-19 DIAGNOSIS — W19XXXA Unspecified fall, initial encounter: Secondary | ICD-10-CM | POA: Diagnosis present

## 2014-06-19 DIAGNOSIS — E871 Hypo-osmolality and hyponatremia: Secondary | ICD-10-CM | POA: Diagnosis present

## 2014-06-19 DIAGNOSIS — Y92009 Unspecified place in unspecified non-institutional (private) residence as the place of occurrence of the external cause: Secondary | ICD-10-CM | POA: Diagnosis not present

## 2014-06-19 DIAGNOSIS — Z87891 Personal history of nicotine dependence: Secondary | ICD-10-CM | POA: Diagnosis not present

## 2014-06-19 DIAGNOSIS — H9190 Unspecified hearing loss, unspecified ear: Secondary | ICD-10-CM | POA: Diagnosis present

## 2014-06-19 DIAGNOSIS — E11649 Type 2 diabetes mellitus with hypoglycemia without coma: Secondary | ICD-10-CM | POA: Diagnosis present

## 2014-06-19 DIAGNOSIS — I129 Hypertensive chronic kidney disease with stage 1 through stage 4 chronic kidney disease, or unspecified chronic kidney disease: Secondary | ICD-10-CM | POA: Diagnosis present

## 2014-06-19 DIAGNOSIS — G8929 Other chronic pain: Secondary | ICD-10-CM | POA: Diagnosis present

## 2014-06-19 DIAGNOSIS — Z905 Acquired absence of kidney: Secondary | ICD-10-CM | POA: Diagnosis present

## 2014-06-19 LAB — GLUCOSE, CAPILLARY
GLUCOSE-CAPILLARY: 27 mg/dL — AB (ref 70–99)
GLUCOSE-CAPILLARY: 89 mg/dL (ref 70–99)
GLUCOSE-CAPILLARY: 56 mg/dL — AB (ref 70–99)
GLUCOSE-CAPILLARY: 161 mg/dL — AB (ref 70–99)
GLUCOSE-CAPILLARY: 159 mg/dL — AB (ref 70–99)
Glucose-Capillary: 163 mg/dL — ABNORMAL HIGH (ref 70–99)
Glucose-Capillary: 151 mg/dL — ABNORMAL HIGH (ref 70–99)
Glucose-Capillary: 307 mg/dL — ABNORMAL HIGH (ref 70–99)
Glucose-Capillary: 207 mg/dL — ABNORMAL HIGH (ref 70–99)
Glucose-Capillary: 166 mg/dL — ABNORMAL HIGH (ref 70–99)

## 2014-06-19 LAB — CBC WITH DIFFERENTIAL/PLATELET
BASOS ABS: 0 10*3/uL (ref 0.0–0.1)
BASOS PCT: 0 % (ref 0–1)
Eosinophils Absolute: 0 10*3/uL (ref 0.0–0.7)
Eosinophils Relative: 0 % (ref 0–5)
HCT: 30.7 % — ABNORMAL LOW (ref 36.0–46.0)
Hemoglobin: 10.2 g/dL — ABNORMAL LOW (ref 12.0–15.0)
LYMPHS ABS: 0.6 10*3/uL — AB (ref 0.7–4.0)
Lymphocytes Relative: 12 % (ref 12–46)
MCH: 27.1 pg (ref 26.0–34.0)
MCHC: 33.2 g/dL (ref 30.0–36.0)
MCV: 81.4 fL (ref 78.0–100.0)
Monocytes Absolute: 0.8 10*3/uL (ref 0.1–1.0)
Monocytes Relative: 15 % — ABNORMAL HIGH (ref 3–12)
Neutro Abs: 3.8 10*3/uL (ref 1.7–7.7)
Neutrophils Relative %: 73 % (ref 43–77)
Platelets: 275 10*3/uL (ref 150–400)
RBC: 3.77 MIL/uL — AB (ref 3.87–5.11)
RDW: 13.1 % (ref 11.5–15.5)
WBC: 5.2 10*3/uL (ref 4.0–10.5)

## 2014-06-19 LAB — COMPREHENSIVE METABOLIC PANEL
ALT: 26 U/L (ref 0–35)
ANION GAP: 6 (ref 5–15)
AST: 63 U/L — ABNORMAL HIGH (ref 0–37)
Albumin: 3 g/dL — ABNORMAL LOW (ref 3.5–5.2)
Alkaline Phosphatase: 53 U/L (ref 39–117)
BUN: 25 mg/dL — ABNORMAL HIGH (ref 6–23)
CALCIUM: 8.8 mg/dL (ref 8.4–10.5)
CO2: 27 mmol/L (ref 19–32)
Chloride: 99 mmol/L (ref 96–112)
Creatinine, Ser: 1.61 mg/dL — ABNORMAL HIGH (ref 0.50–1.10)
GFR calc Af Amer: 32 mL/min — ABNORMAL LOW (ref >90)
GFR calc non Af Amer: 27 mL/min — ABNORMAL LOW (ref >90)
Glucose, Bld: 167 mg/dL — ABNORMAL HIGH (ref 70–99)
Potassium: 3.8 mmol/L (ref 3.5–5.1)
Sodium: 132 mmol/L — ABNORMAL LOW (ref 135–145)
TOTAL PROTEIN: 6.4 g/dL (ref 6.0–8.3)
Total Bilirubin: 0.6 mg/dL (ref 0.3–1.2)

## 2014-06-19 MED ORDER — ACETAMINOPHEN 325 MG PO TABS
650.0000 mg | ORAL_TABLET | Freq: Four times a day (QID) | ORAL | Status: DC | PRN
  Administered 2014-06-20: 650 mg via ORAL
  Filled 2014-06-19: qty 2

## 2014-06-19 MED ORDER — ONDANSETRON HCL 4 MG PO TABS: 4.0000 mg | ORAL_TABLET | Freq: Four times a day (QID) | ORAL | Status: DC | PRN

## 2014-06-19 MED ORDER — DEXTROSE 50 % IV SOLN
INTRAVENOUS | Status: AC
  Administered 2014-06-19: 50 mL
  Filled 2014-06-19: qty 50

## 2014-06-19 MED ORDER — SIMVASTATIN 40 MG PO TABS
40.0000 mg | ORAL_TABLET | Freq: Every day | ORAL | Status: DC
  Administered 2014-06-19 – 2014-06-20 (×2): 40 mg via ORAL
  Filled 2014-06-19 (×3): qty 1

## 2014-06-19 MED ORDER — CEFTRIAXONE SODIUM IN DEXTROSE 20 MG/ML IV SOLN
1.0000 g | Freq: Every day | INTRAVENOUS | Status: DC
  Administered 2014-06-19 – 2014-06-20 (×2): 1 g via INTRAVENOUS
  Filled 2014-06-19 (×3): qty 50

## 2014-06-19 MED ORDER — ONDANSETRON HCL 4 MG/2ML IJ SOLN: 4.0000 mg | Freq: Four times a day (QID) | INTRAMUSCULAR | Status: DC | PRN

## 2014-06-19 MED ORDER — CLONIDINE HCL 0.1 MG PO TABS
0.1000 mg | ORAL_TABLET | Freq: Two times a day (BID) | ORAL | Status: DC
  Administered 2014-06-19 – 2014-06-21 (×6): 0.1 mg via ORAL
  Filled 2014-06-19 (×7): qty 1

## 2014-06-19 MED ORDER — DEXTROSE 5 % AND 0.9 % NACL IV BOLUS: 75.0000 mL | Freq: Once | INTRAVENOUS | Status: DC

## 2014-06-19 MED ORDER — DEXTROSE-NACL 5-0.2 % IV SOLN: INTRAVENOUS | Status: DC

## 2014-06-19 MED ORDER — ASPIRIN EC 81 MG PO TBEC
81.0000 mg | DELAYED_RELEASE_TABLET | Freq: Every day | ORAL | Status: DC
  Administered 2014-06-19 – 2014-06-21 (×3): 81 mg via ORAL
  Filled 2014-06-19 (×3): qty 1

## 2014-06-19 MED ORDER — DEXTROSE-NACL 5-0.9 % IV SOLN
INTRAVENOUS | Status: DC
  Administered 2014-06-19: 03:00:00 via INTRAVENOUS

## 2014-06-19 MED ORDER — ACETAMINOPHEN 650 MG RE SUPP: 650.0000 mg | Freq: Four times a day (QID) | RECTAL | Status: DC | PRN

## 2014-06-19 MED ORDER — HEPARIN SODIUM (PORCINE) 5000 UNIT/ML IJ SOLN
5000.0000 [IU] | Freq: Three times a day (TID) | INTRAMUSCULAR | Status: DC
  Administered 2014-06-19 – 2014-06-21 (×6): 5000 [IU] via SUBCUTANEOUS
  Filled 2014-06-19 (×9): qty 1

## 2014-06-19 MED ORDER — LORAZEPAM 2 MG/ML IJ SOLN
0.5000 mg | Freq: Once | INTRAMUSCULAR | Status: AC
  Administered 2014-06-19: 0.5 mg via INTRAVENOUS
  Filled 2014-06-19: qty 1

## 2014-06-19 NOTE — Progress Notes (Signed)
Called ER RN for report. Roomready for admit. 

## 2014-06-19 NOTE — Progress Notes (Signed)
Patient Demographics  Jeanne Barker, is a 81 y.o. female, DOB - 09/20/1923, ZOX:[id number]  Admit date - 06/18/2014   Admitting Physician Eduard ClosArshad N Kakrakandy, MD  Outpatient Primary MD for the patient is [name], MD  LOS - 0   Chief Complaint  Patient presents with  . Hypoglycemia  . Fall      Admission history of present illness/brief narrative: Jeanne Barker is a 81 y.o. female with history of chronic kidney disease stage III, diabetes mellitus type 2, hyperlipidemia and hypertension had a fall at her house when she was trying to get up from the seat. After she fell she was complaining of right lower extremity pain and he was was called. Patient was found to be confused and patient was hypoglycemic with blood sugar in the 40s. Patient was given D50 for which patient blood sugar improved to 200. In the ER patient had CT scan and x-rays done which did not show anything acute. Patient has known history of chronic pain of the right knee for which patient wears brace. Patient was agitated and again patient was found to be hypoglycemic in the 50s. Patient was given a second dose of D50 on the floor following which patient blood sugar improved. Patient also has UTI and has been admitted for further management. Patient is hard of hearing and with cochlear implant. As per the family patient is not eating well and usually eats her meal in the morning.   Subjective:   Jeanne Barker today has, No headache, No chest pain, No abdominal pain - No Nausea,reports generalized weakness, No Cough - SOB.   Assessment & Plan    Principal Problem:   Hypoglycemia Active Problems:   DMII (diabetes mellitus, type 2)   Chronic kidney disease   UTI (lower urinary tract infection)   Essential hypertension   Hyperlipidemia   hypoglycemia ( diabetes mellitus ) - No further recurrence, initially  coherent D50 2, on D5 normal saline . - Currently CBGs are acceptable,  - Continue to hold antidiabetic medication    UTI - Continue with Rocephin, follow on urine cultures  fall and weakness - Secondary to UTI and hypoglycemia - PT consult  Chronic kidney disease - Creatinine appears to be at baseline, continue to monitor  Hypertension - Pressure acceptable, continue with clonidine  Hyperlipidemia - Continue with simvastatin   Code Status: Full  Family Communication: none at bed side  Disposition Plan: PT evaluation pending    Procedures  None    Consults   None    Medications  Scheduled Meds: . aspirin EC  81 mg Oral Daily  . cefTRIAXone (ROCEPHIN)  IV  1 g Intravenous QHS  . cloNIDine  0.1 mg Oral BID  . heparin  5,000 Units Subcutaneous 3 times per day  . simvastatin  40 mg Oral QHS   Continuous Infusions: . dextrose 5 % and 0.9% NaCl 75 mL/hr at 06/19/14 0243   PRN Meds:.acetaminophen **OR** acetaminophen, ondansetron **OR** ondansetron (ZOFRAN) IV  DVT Prophylaxis   Heparin -   Lab Results  Component Value Date   PLT 275 06/19/2014    Antibiotics    Anti-infectives    Start     Dose/Rate Route Frequency Ordered Stop  06/19/14 2200  cefTRIAXone (ROCEPHIN) 1 g in dextrose 5 % 50 mL IVPB - Premix     1 g 100 mL/hr over 30 Minutes Intravenous Daily at bedtime 06/19/14 0312     06/18/14 2330  cefTRIAXone (ROCEPHIN) 1 g in dextrose 5 % 50 mL IVPB     1 g 100 mL/hr over 30 Minutes Intravenous  Once 06/18/14 2320 06/19/14 0130          Objective:   Filed Vitals:   06/19/14 0110 06/19/14 0427 06/19/14 0603 06/19/14 1000  BP: 142/55 149/52 137/60 148/54  Pulse: 79 79 76 76  Temp: 98.5 F (36.9 C) 99.2 F (37.3 C) 99.6 F (37.6 C) 98 F (36.7 C)  TempSrc: Oral Oral Oral Oral  Resp: 14 16 16 18   Weight: 62.914 kg (138 lb 11.2 oz)     SpO2: 96% 97% 100% 100%    Wt Readings from Last 3 Encounters:  06/19/14 62.914 kg (138 lb 11.2  oz)  04/22/14 65.942 kg (145 lb 6 oz)  12/18/13 66.679 kg (147 lb)     Intake/Output Summary (Last 24 hours) at 06/19/14 1541 Last data filed at 06/19/14 1300  Gross per 24 hour  Intake 726.25 ml  Output    800 ml  Net -73.75 ml     Physical Exam  Awake Alert, OrientedNo new F.N deficits, Normal affect [place].AT,PERRAL Supple Neck,No JVD, No cervical lymphadenopathy appriciated.  Symmetrical Chest wall movement, Good air movement bilaterally,  RRR,No Gallops,Rubs or new Murmurs, No Parasternal Heave +ve B.Sounds, Abd Soft, No tenderness, No organomegaly appriciated, No rebound - guarding or rigidity. No Cyanosis, Clubbing or edema, No new Rash or bruise    Data Review   Micro Results No results found for this or any previous visit (from the past 240 hour(s)).  Radiology Reports Dg Chest 2 View  06/18/2014   CLINICAL DATA:  Two day history of cough.  EXAM: CHEST  2 VIEW  COMPARISON:  None.  FINDINGS: The cardiac silhouette, mediastinal and hilar contours are within normal limits for age. There are chronic appearing bronchitic changes and interstitial thickening. No definite infiltrates or effusions. The bony thorax is grossly normal.  IMPRESSION: Chronic appearing bronchitic changes without definite infiltrates or effusions.   Electronically Signed   By: Rudie Meyer M.D.   On: 06/18/2014 21:29   Ct Knee Right Wo Contrast  06/19/2014   CLINICAL DATA:  81 year old female with right knee pain after fall.  EXAM: CT OF THE RIGHT KNEE WITHOUT CONTRAST  TECHNIQUE: Multidetector CT imaging of the RIGHT knee was performed according to the standard protocol. Multiplanar CT image reconstructions were also generated.  COMPARISON:  Right knee radiographs earlier this day.  FINDINGS: Multiple image acquisitions due to patient motion. No acute fracture. There is moderate tricompartmental osteoarthritis with tricompartmental joint space narrowing and peripheral spurring. Fragmented osteophyte at the  tibial spine. There is a small joint effusion. No lipohemarthrosis. No Baker cyst. No focal soft tissue edema.  IMPRESSION: Moderate tricompartmental osteoarthritis without acute fracture.   Electronically Signed   By: Rubye Oaks M.D.   On: 06/19/2014 00:40   Ct Hip Right Wo Contrast  06/19/2014   CLINICAL DATA:  Right hip pain after a fall.  EXAM: CT OF THE RIGHT HIP WITHOUT CONTRAST  TECHNIQUE: Multidetector CT imaging of the right hip was performed according to the standard protocol. Multiplanar CT image reconstructions were also generated.  COMPARISON:  Right femur 06/18/2014  FINDINGS: The right hip  appears intact. No acute displaced fractures identified. No focal bone lesion or bone destruction. Bone cortex and trabecular architecture appear intact. Visualized right pelvis and acetabulum appear intact. Degenerative changes at the right sacroiliac joint. Soft tissues are unremarkable. Foley catheter in the bladder. Diverticulosis of the sigmoid colon.  IMPRESSION: No acute displaced right hip fractures identified.   Electronically Signed   By: Burman Nieves M.D.   On: 06/19/2014 00:38   Dg Knee Complete 4 Views Right  06/18/2014   CLINICAL DATA:  Fall with right knee pain.  EXAM: RIGHT KNEE - COMPLETE 4+ VIEW  COMPARISON:  None.  FINDINGS: Osteophytes and spurring in the medial, lateral and patellofemoral compartments. Negative for a fracture or dislocation. Evidence for a small suprapatellar joint effusion.  IMPRESSION: Moderate osteoarthritis in the right knee without acute bone abnormality. Suspect a small suprapatellar joint effusion.   Electronically Signed   By: Richarda Overlie M.D.   On: 06/18/2014 21:25   Dg Hip Unilat With Pelvis 2-3 Views Right  06/18/2014   CLINICAL DATA:  Fall with right hip pain.  EXAM: RIGHT HIP (WITH PELVIS) 2-3 VIEWS  COMPARISON:  None.  FINDINGS: The pelvic bony ring is intact. Bowel gas and stool in the abdomen and pelvis. The right hip is located without acute  fracture. Minimal degenerative changes in the hip joints. Scattered calcifications throughout the pelvic region.  IMPRESSION: No acute bone abnormality in the pelvis or right hip.   Electronically Signed   By: Richarda Overlie M.D.   On: 06/18/2014 21:26   Dg Femur, Min 2 Views Right  06/18/2014   CLINICAL DATA:  Fall and right hip pain.  EXAM: RIGHT FEMUR 2 VIEWS  COMPARISON:  Right hip 06/18/2014  FINDINGS: There is no evidence of fracture or dislocation. Degenerative changes in the right knee.  IMPRESSION: No acute bone abnormality to the right femur.   Electronically Signed   By: Richarda Overlie M.D.   On: 06/18/2014 21:28    CBC  Recent Labs Lab 06/18/14 2120 06/19/14 0726  WBC 8.5 5.2  HGB 10.7* 10.2*  HCT 32.3* 30.7*  PLT 269 275  MCV 81.4 81.4  MCH 27.0 27.1  MCHC 33.1 33.2  RDW 12.9 13.1  LYMPHSABS 0.6* 0.6*  MONOABS 1.1* 0.8  EOSABS 0.0 0.0  BASOSABS 0.0 0.0    Chemistries   Recent Labs Lab 06/18/14 2120 06/19/14 0726  NA 131* 132*  K 4.4 3.8  CL 99 99  CO2 23 27  GLUCOSE 85 167*  BUN 31* 25*  CREATININE 1.82* 1.61*  CALCIUM 9.0 8.8  AST  --  63*  ALT  --  26  ALKPHOS  --  53  BILITOT  --  0.6   ------------------------------------------------------------------------------------------------------------------ estimated creatinine clearance is 20.5 mL/min (by C-G formula based on Cr of 1.61). ------------------------------------------------------------------------------------------------------------------ No results for input(s): HGBA1C in the last 72 hours. ------------------------------------------------------------------------------------------------------------------ No results for input(s): CHOL, HDL, LDLCALC, TRIG, CHOLHDL, LDLDIRECT in the last 72 hours. ------------------------------------------------------------------------------------------------------------------ No results for input(s): TSH, T4TOTAL, T3FREE, THYROIDAB in the last 72 hours.  Invalid  input(s): FREET3 ------------------------------------------------------------------------------------------------------------------ No results for input(s): VITAMINB12, FOLATE, FERRITIN, TIBC, IRON, RETICCTPCT in the last 72 hours.  Coagulation profile No results for input(s): INR, PROTIME in the last 168 hours.  No results for input(s): DDIMER in the last 72 hours.  Cardiac Enzymes No results for input(s): CKMB, TROPONINI, MYOGLOBIN in the last 168 hours.  Invalid input(s): CK ------------------------------------------------------------------------------------------------------------------ Invalid input(s): POCBNP  Time Spent in minutes   30 minutes   Suetta Hoffmeister M.D on 06/19/2014 at 3:41 PM  Between 7am to 7pm - Pager - [phone]  After 7pm go to [email or web address] - password [email or web address]  And look for the night coverage person covering for me after hours  [facility] Office  [phone]   **Disclaimer: This note may have been dictated with voice recognition software. Similar sounding words can inadvertently be transcribed and this note may contain transcription errors which may not have been corrected upon publication of note.**

## 2014-06-19 NOTE — Progress Notes (Signed)
Hypoglycemic Event  CBG:27  Treatment: D50 IV 50 mL  Symptoms: Nervous/irritable  Follow-up CBG: Time:0215 CBG Result:163 Possible Reasons for Event: Inadequate meal intake  Comments/MD notified:    Jeanne Barker, Jeanne Barker  Remember to initiate Hypoglycemia Order Set & complete

## 2014-06-19 NOTE — H&P (Signed)
[facility] History and Physical  Jeanne Barker XIP:[id number] DOB: 12-26-1923 DOA: 06/18/2014  Referring physician: ER physician. PCP: [name], MD   Chief Complaint: Fall.  History obtained from ER physician and patient's nurse as patient is hard of hearing.  HPI: Jeanne Barker is a 81 y.o. female with history of chronic kidney disease stage III, diabetes mellitus type 2, hyperlipidemia and hypertension had a fall at her house when she was trying to get up from the seat. After she fell she was complaining of right lower extremity pain and he was was called. Patient was found to be confused and patient was hypoglycemic with blood sugar in the 40s. Patient was given D50 for which patient blood sugar improved to 200. In the ER patient had CT scan and x-rays done which did not show anything acute. Patient has known history of chronic pain of the right knee for which patient wears brace. Patient was agitated and again patient was found to be hypoglycemic in the 50s. Patient was given a second dose of D50 on the floor following which patient blood sugar improved. Patient also has UTI and has been admitted for further management. Patient is hard of hearing and with cochlear implant. As per the family patient is not eating well and usually eats her meal in the morning.   Review of Systems: As presented in the history of presenting illness, rest negative.  Past Medical History  Diagnosis Date  . Anemia   . Diabetes mellitus   . Hyperlipidemia   . Chronic renal insufficiency   . Hard of hearing   . Syphilis     ?latent?, treated c/ PCN 1993  . Osteoarthritis   . Hypertension    Past Surgical History  Procedure Laterality Date  . Cochlear impalnt  1/02  . Abdominal hysterectomy      BSO  . Nephrectomy      left  . Oophorectomy    . Cataract extraction     Social History:  reports that she has quit smoking. She has never used smokeless tobacco. She reports that she does not drink  alcohol. Her drug history is not on file. Where does patient live home. Can patient participate in ADLs? Yes.  Allergies  Allergen Reactions  . Amlodipine Besylate Nausea Only    REACTION: nausea  . Lisinopril Other (See Comments)    REACTION: hyperkalemia    Family History: History reviewed. No pertinent family history.    Prior to Admission medications   Medication Sig Start Date End Date Taking? Authorizing Provider  acetaminophen (TYLENOL) 500 MG tablet Take 500 mg by mouth every 6 (six) hours as needed.     Yes Historical Provider, MD  aspirin 81 MG tablet Take 81 mg by mouth daily.     Yes Historical Provider, MD  cloNIDine (CATAPRES) 0.1 MG tablet TAKE 1 TABLET BY MOUTH TWICE DAILY 04/07/14  Yes [name], MD  furosemide (LASIX) 20 MG tablet TAKE 1 TABLET BY MOUTH DAILY 02/18/14  Yes [name], MD  glimepiride (AMARYL) 2 MG tablet TAKE 2 TABLETS BY MOUTH DAILY 01/13/14  Yes [name], MD  JANUVIA 50 MG tablet TAKE 1 TABLET BY MOUTH DAILY 02/20/14  Yes [name], MD  simvastatin (ZOCOR) 40 MG tablet TAKE 1 TABLET BY MOUTH EVERY NIGHT AT BEDTIME 03/20/14  Yes [name], MD  trolamine salicylate (ASPERCREME) 10 % cream Apply topically as needed. 12/06/12  Yes Kelle Darting, NP  vitamin  B-12 (CYANOCOBALAMIN) 100 MCG tablet Take 100 mcg by mouth daily.    Yes Historical Provider, MD  glucose blood test strip 1 each by Other route as needed. Use as instructed     Historical Provider, MD  ONE TOUCH LANCETS MISC by Does not apply route.      Historical Provider, MD    Physical Exam: Filed Vitals:   06/19/14 0014 06/19/14 0021 06/19/14 0103 06/19/14 0110  BP:   162/66 142/55  Pulse: 77 83 90 79  Temp:    98.5 F (36.9 C)  TempSrc:    Oral  Resp: 16 18  14   SpO2: 98% 97% 97% 96%     General:  Moderately built and nourished.  Eyes: Anicteric no pallor.  ENT: No discharge from ears eyes nose and mouth.  Neck: No mass felt.  Cardiovascular: S1 and S2  heard.  Respiratory: No rhonchi or crepitations.  Abdomen: Soft nontender bowel sounds present.  Skin: No rash.  Musculoskeletal: No edema.  Psychiatric: Appears normal.  Neurologic: Alert awake oriented to her name and follows commands and moves all extremities.  Labs on Admission:  Basic Metabolic Panel:  Recent Labs Lab 06/18/14 2120  NA 131*  K 4.4  CL 99  CO2 23  GLUCOSE 85  BUN 31*  CREATININE 1.82*  CALCIUM 9.0   Liver Function Tests: No results for input(s): AST, ALT, ALKPHOS, BILITOT, PROT, ALBUMIN in the last 168 hours. No results for input(s): LIPASE, AMYLASE in the last 168 hours. No results for input(s): AMMONIA in the last 168 hours. CBC:  Recent Labs Lab 06/18/14 2120  WBC 8.5  NEUTROABS 6.8  HGB 10.7*  HCT 32.3*  MCV 81.4  PLT 269   Cardiac Enzymes: No results for input(s): CKTOTAL, CKMB, CKMBINDEX, TROPONINI in the last 168 hours.  BNP (last 3 results) No results for input(s): BNP in the last 8760 hours.  ProBNP (last 3 results) No results for input(s): PROBNP in the last 8760 hours.  CBG:  Recent Labs Lab 06/18/14 2132  GLUCAP 79    Radiological Exams on Admission: Dg Chest 2 View  06/18/2014   CLINICAL DATA:  Two day history of cough.  EXAM: CHEST  2 VIEW  COMPARISON:  None.  FINDINGS: The cardiac silhouette, mediastinal and hilar contours are within normal limits for age. There are chronic appearing bronchitic changes and interstitial thickening. No definite infiltrates or effusions. The bony thorax is grossly normal.  IMPRESSION: Chronic appearing bronchitic changes without definite infiltrates or effusions.   Electronically Signed   By: Rudie MeyerP.  Gallerani M.D.   On: 06/18/2014 21:29   Ct Knee Right Wo Contrast  06/19/2014   CLINICAL DATA:  81 year old female with right knee pain after fall.  EXAM: CT OF THE RIGHT KNEE WITHOUT CONTRAST  TECHNIQUE: Multidetector CT imaging of the RIGHT knee was performed according to the standard  protocol. Multiplanar CT image reconstructions were also generated.  COMPARISON:  Right knee radiographs earlier this day.  FINDINGS: Multiple image acquisitions due to patient motion. No acute fracture. There is moderate tricompartmental osteoarthritis with tricompartmental joint space narrowing and peripheral spurring. Fragmented osteophyte at the tibial spine. There is a small joint effusion. No lipohemarthrosis. No Baker cyst. No focal soft tissue edema.  IMPRESSION: Moderate tricompartmental osteoarthritis without acute fracture.   Electronically Signed   By: Rubye OaksMelanie  Ehinger M.D.   On: 06/19/2014 00:40   Ct Hip Right Wo Contrast  06/19/2014   CLINICAL DATA:  Right hip pain  after a fall.  EXAM: CT OF THE RIGHT HIP WITHOUT CONTRAST  TECHNIQUE: Multidetector CT imaging of the right hip was performed according to the standard protocol. Multiplanar CT image reconstructions were also generated.  COMPARISON:  Right femur 06/18/2014  FINDINGS: The right hip appears intact. No acute displaced fractures identified. No focal bone lesion or bone destruction. Bone cortex and trabecular architecture appear intact. Visualized right pelvis and acetabulum appear intact. Degenerative changes at the right sacroiliac joint. Soft tissues are unremarkable. Foley catheter in the bladder. Diverticulosis of the sigmoid colon.  IMPRESSION: No acute displaced right hip fractures identified.   Electronically Signed   By: Burman Nieves M.D.   On: 06/19/2014 00:38   Dg Knee Complete 4 Views Right  06/18/2014   CLINICAL DATA:  Fall with right knee pain.  EXAM: RIGHT KNEE - COMPLETE 4+ VIEW  COMPARISON:  None.  FINDINGS: Osteophytes and spurring in the medial, lateral and patellofemoral compartments. Negative for a fracture or dislocation. Evidence for a small suprapatellar joint effusion.  IMPRESSION: Moderate osteoarthritis in the right knee without acute bone abnormality. Suspect a small suprapatellar joint effusion.    Electronically Signed   By: Richarda Overlie M.D.   On: 06/18/2014 21:25   Dg Hip Unilat With Pelvis 2-3 Views Right  06/18/2014   CLINICAL DATA:  Fall with right hip pain.  EXAM: RIGHT HIP (WITH PELVIS) 2-3 VIEWS  COMPARISON:  None.  FINDINGS: The pelvic bony ring is intact. Bowel gas and stool in the abdomen and pelvis. The right hip is located without acute fracture. Minimal degenerative changes in the hip joints. Scattered calcifications throughout the pelvic region.  IMPRESSION: No acute bone abnormality in the pelvis or right hip.   Electronically Signed   By: Richarda Overlie M.D.   On: 06/18/2014 21:26   Dg Femur, Min 2 Views Right  06/18/2014   CLINICAL DATA:  Fall and right hip pain.  EXAM: RIGHT FEMUR 2 VIEWS  COMPARISON:  Right hip 06/18/2014  FINDINGS: There is no evidence of fracture or dislocation. Degenerative changes in the right knee.  IMPRESSION: No acute bone abnormality to the right femur.   Electronically Signed   By: Richarda Overlie M.D.   On: 06/18/2014 21:28    EKG: Independently reviewed. Normal sinus rhythm with PVCs  Assessment/Plan Principal Problem:   Hypoglycemia Active Problems:   DMII (diabetes mellitus, type 2)   Chronic kidney disease   UTI (lower urinary tract infection)   Essential hypertension   Hyperlipidemia   1. Hypoglycemia - with poor oral intake and chronic kidney disease at this time I have placed patient on D5 normal saline and we will check CBGs every 2 hourly for now. Hold patient's diabetic medications. If patient's blood sugar remains consistently high then we will restart her diabetic medications in a lower dose. 2. UTI - patient has been placed on ceftriaxone. Check urine culture. 3. Chronic kidney disease stage III - creatinine appears to be at baseline. 4. Hypertension - continue home medications. 5. Hyperlipidemia - continue home medications.   DVT Prophylaxis heparin.  Code Status: Full code.  Family Communication: None.  Disposition Plan: Admit to  inpatient.    Anish Vana N. [facility] Pager [phone].  If 7PM-7AM, please contact night-coverage [email or web address] Password [email or web address] 06/19/2014, 3:04 AM

## 2014-06-19 NOTE — ED Provider Notes (Signed)
CSN: [id number]     Arrival date & time 06/18/14  1959 History   First MD Initiated Contact with Patient 06/18/14 2014     Chief Complaint  Patient presents with  . Hypoglycemia  . Fall     (Consider location/radiation/quality/duration/timing/severity/associated sxs/prior Treatment) HPI   81 year old female presenting after a fall. Patient was getting up from a chair when she slipped to the floor. She was unable to get up and was complaining of right lower extremity pain. Pain from her hip to her knee. Patient with chronic right knee pain and often wears a brace for it. Initially blood sugar on scene was 42. She is given D 50 with improvement of blood sugar to the 200s. Patient with no acute complaints aside from pain in her right lower extremity. Denies any headache, neck or back pain. Patient is hard of hearing and has a cochlear implant. Family reports that she disclosed her baseline but seems somewhat agitated.  Past Medical History  Diagnosis Date  . Anemia   . Diabetes mellitus   . Hyperlipidemia   . Chronic renal insufficiency   . Hard of hearing   . Syphilis     ?latent?, treated c/ PCN 1993  . Osteoarthritis    Past Surgical History  Procedure Laterality Date  . Cochlear impalnt  1/02  . Abdominal hysterectomy      BSO  . Nephrectomy      left  . Oophorectomy    . Cataract extraction     No family history on file. History  Substance Use Topics  . Smoking status: Former Games developer  . Smokeless tobacco: Never Used  . Alcohol Use: No   OB History    No data available     Review of Systems  All systems reviewed and negative, other than as noted in HPI.   Allergies  Amlodipine besylate and Lisinopril  Home Medications   Prior to Admission medications   Medication Sig Start Date End Date Taking? Authorizing Provider  acetaminophen (TYLENOL) 500 MG tablet Take 500 mg by mouth every 6 (six) hours as needed.     Yes Historical Provider, MD  aspirin 81 MG tablet  Take 81 mg by mouth daily.     Yes Historical Provider, MD  cloNIDine (CATAPRES) 0.1 MG tablet TAKE 1 TABLET BY MOUTH TWICE DAILY 04/07/14  Yes [name], MD  furosemide (LASIX) 20 MG tablet TAKE 1 TABLET BY MOUTH DAILY 02/18/14  Yes [name], MD  glimepiride (AMARYL) 2 MG tablet TAKE 2 TABLETS BY MOUTH DAILY 01/13/14  Yes [name], MD  JANUVIA 50 MG tablet TAKE 1 TABLET BY MOUTH DAILY 02/20/14  Yes [name], MD  simvastatin (ZOCOR) 40 MG tablet TAKE 1 TABLET BY MOUTH EVERY NIGHT AT BEDTIME 03/20/14  Yes [name], MD  trolamine salicylate (ASPERCREME) 10 % cream Apply topically as needed. 12/06/12  Yes Kelle Darting, NP  vitamin B-12 (CYANOCOBALAMIN) 100 MCG tablet Take 100 mcg by mouth daily.    Yes Historical Provider, MD  glucose blood test strip 1 each by Other route as needed. Use as instructed     Historical Provider, MD  ONE TOUCH LANCETS MISC by Does not apply route.      Historical Provider, MD   BP 138/56 mmHg  Pulse 80  Temp(Src) 98.9 F (37.2 C) (Oral)  Resp 22  SpO2 100% Physical Exam  Constitutional: She appears well-developed and well-nourished. No distress.  HENT:  Head: Normocephalic and atraumatic.  Eyes: Conjunctivae are normal. Right eye exhibits no discharge. Left eye exhibits no discharge.  Neck: Neck supple.  Cardiovascular: Normal rate, regular rhythm and normal heart sounds.  Exam reveals no gallop and no friction rub.   No murmur heard. Pulmonary/Chest: Effort normal and breath sounds normal. No respiratory distress.  Abdominal: Soft. She exhibits no distension. There is no tenderness.  Musculoskeletal: She exhibits no edema or tenderness.  No midline spinal tenderness. Tenderness to R anterior knee. Crepitus with ROM and pt will cry out in pain. No deformity. NVI distally. Increased pain with ROM of R hip but question how much is actually from her knee.   Neurological: She is alert.  Oriented x3. Hard of Hearing. CN otherwise intact. Strength 5/5 b/l  u/l extremities.   Skin: Skin is warm and dry.  Psychiatric: She has a normal mood and affect. Her behavior is normal. Thought content normal.  Nursing note and vitals reviewed.   ED Course  Procedures (including critical care time) Labs Review Labs Reviewed  URINALYSIS, ROUTINE W REFLEX MICROSCOPIC - Abnormal; Notable for the following:    Glucose, UA 100 (*)    Hgb urine dipstick LARGE (*)    Protein, ur 100 (*)    Nitrite POSITIVE (*)    Leukocytes, UA TRACE (*)    All other components within normal limits  BASIC METABOLIC PANEL - Abnormal; Notable for the following:    Sodium 131 (*)    BUN 31 (*)    Creatinine, Ser 1.82 (*)    GFR calc non Af Amer 24 (*)    GFR calc Af Amer 27 (*)    All other components within normal limits  CBC WITH DIFFERENTIAL/PLATELET - Abnormal; Notable for the following:    Hemoglobin 10.7 (*)    HCT 32.3 (*)    Neutrophils Relative % 80 (*)    Lymphocytes Relative 7 (*)    Lymphs Abs 0.6 (*)    Monocytes Relative 13 (*)    Monocytes Absolute 1.1 (*)    All other components within normal limits  URINE MICROSCOPIC-ADD ON - Abnormal; Notable for the following:    Squamous Epithelial / LPF FEW (*)    Bacteria, UA MANY (*)    All other components within normal limits  URINE CULTURE  CBG MONITORING, ED    Imaging Review Dg Chest 2 View  06/18/2014   CLINICAL DATA:  Two day history of cough.  EXAM: CHEST  2 VIEW  COMPARISON:  None.  FINDINGS: The cardiac silhouette, mediastinal and hilar contours are within normal limits for age. There are chronic appearing bronchitic changes and interstitial thickening. No definite infiltrates or effusions. The bony thorax is grossly normal.  IMPRESSION: Chronic appearing bronchitic changes without definite infiltrates or effusions.   Electronically Signed   By: Rudie Meyer M.D.   On: 06/18/2014 21:29   Dg Knee Complete 4 Views Right  06/18/2014   CLINICAL DATA:  Fall with right knee pain.  EXAM: RIGHT KNEE -  COMPLETE 4+ VIEW  COMPARISON:  None.  FINDINGS: Osteophytes and spurring in the medial, lateral and patellofemoral compartments. Negative for a fracture or dislocation. Evidence for a small suprapatellar joint effusion.  IMPRESSION: Moderate osteoarthritis in the right knee without acute bone abnormality. Suspect a small suprapatellar joint effusion.   Electronically Signed   By: Richarda Overlie M.D.   On: 06/18/2014 21:25   Dg Hip Unilat With Pelvis 2-3 Views Right  06/18/2014  CLINICAL DATA:  Fall with right hip pain.  EXAM: RIGHT HIP (WITH PELVIS) 2-3 VIEWS  COMPARISON:  None.  FINDINGS: The pelvic bony ring is intact. Bowel gas and stool in the abdomen and pelvis. The right hip is located without acute fracture. Minimal degenerative changes in the hip joints. Scattered calcifications throughout the pelvic region.  IMPRESSION: No acute bone abnormality in the pelvis or right hip.   Electronically Signed   By: Richarda OverlieAdam  Henn M.D.   On: 06/18/2014 21:26   Dg Femur, Min 2 Views Right  06/18/2014   CLINICAL DATA:  Fall and right hip pain.  EXAM: RIGHT FEMUR 2 VIEWS  COMPARISON:  Right hip 06/18/2014  FINDINGS: There is no evidence of fracture or dislocation. Degenerative changes in the right knee.  IMPRESSION: No acute bone abnormality to the right femur.   Electronically Signed   By: Richarda OverlieAdam  Henn M.D.   On: 06/18/2014 21:28     EKG Interpretation   Date/Time:  Wednesday June 18 2014 20:13:11 EST Ventricular Rate:  85 PR Interval:  193 QRS Duration: 83 QT Interval:  340 QTC Calculation: 404 R Axis:   -2 Text Interpretation:  Sinus rhythm Premature ventricular complexes  Confirmed by Juleen ChinaKOHUT  MD, Ilan Kahrs ([id number]) on 06/18/2014 10:52:43 PM      MDM   Final diagnoses:    Fall, initial encounter  UTI (lower urinary tract infection)    89yF with what sounds like a mechanical fall at home shortly before arrival. R knee pain, but imaging w/o acute osseous injury. NVI intact. W/u significant for UTI. Family  reports that agitated. May have contributed to fall. Will admit. Mild hyponatremia. Was 132 in January and has been in this range on other occasions as well. Renal function appears to be at baseline.     Raeford RazorStephen Takeria Marquina, MD 06/19/14 [phone]

## 2014-06-20 LAB — BASIC METABOLIC PANEL
Anion gap: 8 (ref 5–15)
BUN: 27 mg/dL — ABNORMAL HIGH (ref 6–23)
CHLORIDE: 98 mmol/L (ref 96–112)
CO2: 25 mmol/L (ref 19–32)
CREATININE: 1.61 mg/dL — AB (ref 0.50–1.10)
Calcium: 8.3 mg/dL — ABNORMAL LOW (ref 8.4–10.5)
GFR, EST AFRICAN AMERICAN: 32 mL/min — AB (ref >90)
GFR, EST NON AFRICAN AMERICAN: 27 mL/min — AB (ref >90)
GLUCOSE: 79 mg/dL (ref 70–99)
POTASSIUM: 4.2 mmol/L (ref 3.5–5.1)
Sodium: 131 mmol/L — ABNORMAL LOW (ref 135–145)

## 2014-06-20 LAB — HEMOGLOBIN A1C
Hgb A1c MFr Bld: 7.8 % — ABNORMAL HIGH (ref 4.8–5.6)
Mean Plasma Glucose: 177 mg/dL

## 2014-06-20 LAB — CBC
HCT: 28.8 % — ABNORMAL LOW (ref 36.0–46.0)
Hemoglobin: 9.6 g/dL — ABNORMAL LOW (ref 12.0–15.0)
MCH: 26.8 pg (ref 26.0–34.0)
MCHC: 33.3 g/dL (ref 30.0–36.0)
MCV: 80.4 fL (ref 78.0–100.0)
Platelets: 233 10*3/uL (ref 150–400)
RBC: 3.58 MIL/uL — ABNORMAL LOW (ref 3.87–5.11)
RDW: 13.2 % (ref 11.5–15.5)
WBC: 4.9 10*3/uL (ref 4.0–10.5)

## 2014-06-20 LAB — GLUCOSE, CAPILLARY
GLUCOSE-CAPILLARY: 123 mg/dL — AB (ref 70–99)
GLUCOSE-CAPILLARY: 161 mg/dL — AB (ref 70–99)
GLUCOSE-CAPILLARY: 245 mg/dL — AB (ref 70–99)
GLUCOSE-CAPILLARY: 302 mg/dL — AB (ref 70–99)
Glucose-Capillary: 180 mg/dL — ABNORMAL HIGH (ref 70–99)
Glucose-Capillary: 182 mg/dL — ABNORMAL HIGH (ref 70–99)
Glucose-Capillary: 228 mg/dL — ABNORMAL HIGH (ref 70–99)
Glucose-Capillary: 230 mg/dL — ABNORMAL HIGH (ref 70–99)
Glucose-Capillary: 279 mg/dL — ABNORMAL HIGH (ref 70–99)
Glucose-Capillary: 327 mg/dL — ABNORMAL HIGH (ref 70–99)
Glucose-Capillary: 63 mg/dL — ABNORMAL LOW (ref 70–99)

## 2014-06-20 MED ORDER — SITAGLIPTIN PHOSPHATE 25 MG PO TABS
25.0000 mg | ORAL_TABLET | Freq: Every day | ORAL | Status: DC
  Administered 2014-06-20: 25 mg via ORAL
  Filled 2014-06-20: qty 1

## 2014-06-20 MED ORDER — SITAGLIPTIN PHOSPHATE 50 MG PO TABS
50.0000 mg | ORAL_TABLET | Freq: Every day | ORAL | Status: DC
  Administered 2014-06-21: 50 mg via ORAL
  Filled 2014-06-20: qty 1

## 2014-06-20 MED ORDER — NON FORMULARY: 50.0000 mg | Freq: Every day | Status: DC

## 2014-06-20 NOTE — Progress Notes (Signed)
Patient Demographics  Jeanne Barker, is a 81 y.o. female, DOB - 01/21/1924, KGM:[id number]  Admit date - 06/18/2014   Admitting Physician Eduard ClosArshad N Kakrakandy, MD  Outpatient Primary MD for the patient is [name], MD  LOS - 1   Chief Complaint  Patient presents with  . Hypoglycemia  . Fall      Admission history of present illness/brief narrative: Jeanne Barker is a 81 y.o. female with history of chronic kidney disease stage III, diabetes mellitus type 2, hyperlipidemia and hypertension had a fall at her house when she was trying to get up from the seat. After she fell she was complaining of right lower extremity pain and he was was called. Patient was found to be confused and patient was hypoglycemic with blood sugar in the 40s. Patient was given D50 for which patient blood sugar improved to 200. In the ER patient had CT scan and x-rays done which did not show anything acute. Patient has known history of chronic pain of the right knee for which patient wears brace. Patient was agitated and again patient was found to be hypoglycemic in the 50s. Patient was given a second dose of D50 on the floor following which patient blood sugar improved. Patient also has UTI and has been admitted for further management. Patient is hard of hearing and with cochlear implant. As per the family patient is not eating well and usually eats her meal in the morning.   Subjective:   Jeanne Barker today has, No headache, No chest pain, No abdominal pain - No Nausea,reports generalized weakness, No Cough - SOB.   Assessment & Plan    Principal Problem:   Hypoglycemia Active Problems:   DMII (diabetes mellitus, type 2)   Chronic kidney disease   UTI (lower urinary tract infection)   Essential hypertension   Hyperlipidemia   hypoglycemia ( diabetes mellitus ) - No further recurrence, initially  required D50 2, on D5 normal saline ( hypoglycemic this a.m. at 63) - Currently CBGs are very labile, will resume back on home dose Januvia, would consider holding Amaryl on discharge.    UTI -  on Rocephin 3/10 - Urine culture Escherichia coli, sensitivities pending   fall and weakness - Secondary to UTI and hypoglycemia - PT consult  Chronic kidney disease - Creatinine appears to be at baseline, continue to monitor  Hypertension - Pressure acceptable, continue with clonidine  Hyperlipidemia - Continue with simvastatin   Code Status: Full  Family Communication: daughter at bed side  Disposition Plan: Home with home care once stable   Procedures  None    Consults   None    Medications  Scheduled Meds: . aspirin EC  81 mg Oral Daily  . cefTRIAXone (ROCEPHIN)  IV  1 g Intravenous QHS  . cloNIDine  0.1 mg Oral BID  . heparin  5,000 Units Subcutaneous 3 times per day  . simvastatin  40 mg Oral QHS  . sitaGLIPtin  25 mg Oral Daily   Continuous Infusions:   PRN Meds:.acetaminophen **OR** acetaminophen, ondansetron **OR** ondansetron (ZOFRAN) IV  DVT Prophylaxis   Heparin -   Lab Results  Component Value Date   PLT 233 06/20/2014    Antibiotics    Anti-infectives  Start     Dose/Rate Route Frequency Ordered Stop   06/19/14 2200  cefTRIAXone (ROCEPHIN) 1 g in dextrose 5 % 50 mL IVPB - Premix     1 g 100 mL/hr over 30 Minutes Intravenous Daily at bedtime 06/19/14 0312     06/18/14 2330  cefTRIAXone (ROCEPHIN) 1 g in dextrose 5 % 50 mL IVPB     1 g 100 mL/hr over 30 Minutes Intravenous  Once 06/18/14 2320 06/19/14 0130          Objective:   Filed Vitals:   06/20/14 0433 06/20/14 1028 06/20/14 1059 06/20/14 1700  BP: 121/42 129/40 133/75 137/45  Pulse: 63 64 81 67  Temp: 98.3 F (36.8 C) 98.2 F (36.8 C) 99.2 F (37.3 C) 98.8 F (37.1 C)  TempSrc: Oral Oral Oral Oral  Resp: 18 18 18 19   Weight:      SpO2: 98% 100% 97% 97%    Wt  Readings from Last 3 Encounters:  06/19/14 63.2 kg (139 lb 5.3 oz)  04/22/14 65.942 kg (145 lb 6 oz)  12/18/13 66.679 kg (147 lb)     Intake/Output Summary (Last 24 hours) at 06/20/14 1848 Last data filed at 06/20/14 1700  Gross per 24 hour  Intake    460 ml  Output    950 ml  Net   -490 ml     Physical Exam  Awake Alert, OrientedNo new F.N deficits, Normal affect Unity.AT,PERRAL Supple Neck,No JVD, No cervical lymphadenopathy appriciated.  Symmetrical Chest wall movement, Good air movement bilaterally,  RRR,No Gallops,Rubs or new Murmurs, No Parasternal Heave +ve B.Sounds, Abd Soft, No tenderness, No organomegaly appriciated, No rebound - guarding or rigidity. No Cyanosis, Clubbing or edema, No new Rash or bruise    Data Review   Micro Results Recent Results (from the past 240 hour(s))  Urine culture     Status: None (Preliminary result)   Collection Time: 06/18/14 10:10 PM  Result Value Ref Range Status   Specimen Description URINE, CLEAN CATCH  Final   Special Requests NONE  Final   Colony Count   Final    >=100,000 COLONIES/ML Performed at [facility]    Culture   Final    ESCHERICHIA COLI Performed at [facility]    Report Status PENDING  Incomplete    Radiology Reports Dg Chest 2 View  06/18/2014   CLINICAL DATA:  Two day history of cough.  EXAM: CHEST  2 VIEW  COMPARISON:  None.  FINDINGS: The cardiac silhouette, mediastinal and hilar contours are within normal limits for age. There are chronic appearing bronchitic changes and interstitial thickening. No definite infiltrates or effusions. The bony thorax is grossly normal.  IMPRESSION: Chronic appearing bronchitic changes without definite infiltrates or effusions.   Electronically Signed   By: Rudie Meyer M.D.   On: 06/18/2014 21:29   Ct Knee Right Wo Contrast  06/19/2014   CLINICAL DATA:  81 year old female with right knee pain after fall.  EXAM: CT OF THE RIGHT KNEE WITHOUT CONTRAST   TECHNIQUE: Multidetector CT imaging of the RIGHT knee was performed according to the standard protocol. Multiplanar CT image reconstructions were also generated.  COMPARISON:  Right knee radiographs earlier this day.  FINDINGS: Multiple image acquisitions due to patient motion. No acute fracture. There is moderate tricompartmental osteoarthritis with tricompartmental joint space narrowing and peripheral spurring. Fragmented osteophyte at the tibial spine. There is a small joint effusion. No lipohemarthrosis. No Baker cyst. No focal soft  tissue edema.  IMPRESSION: Moderate tricompartmental osteoarthritis without acute fracture.   Electronically Signed   By: Rubye Oaks M.D.   On: 06/19/2014 00:40   Ct Hip Right Wo Contrast  06/19/2014   CLINICAL DATA:  Right hip pain after a fall.  EXAM: CT OF THE RIGHT HIP WITHOUT CONTRAST  TECHNIQUE: Multidetector CT imaging of the right hip was performed according to the standard protocol. Multiplanar CT image reconstructions were also generated.  COMPARISON:  Right femur 06/18/2014  FINDINGS: The right hip appears intact. No acute displaced fractures identified. No focal bone lesion or bone destruction. Bone cortex and trabecular architecture appear intact. Visualized right pelvis and acetabulum appear intact. Degenerative changes at the right sacroiliac joint. Soft tissues are unremarkable. Foley catheter in the bladder. Diverticulosis of the sigmoid colon.  IMPRESSION: No acute displaced right hip fractures identified.   Electronically Signed   By: Burman Nieves M.D.   On: 06/19/2014 00:38   Dg Knee Complete 4 Views Right  06/18/2014   CLINICAL DATA:  Fall with right knee pain.  EXAM: RIGHT KNEE - COMPLETE 4+ VIEW  COMPARISON:  None.  FINDINGS: Osteophytes and spurring in the medial, lateral and patellofemoral compartments. Negative for a fracture or dislocation. Evidence for a small suprapatellar joint effusion.  IMPRESSION: Moderate osteoarthritis in the right  knee without acute bone abnormality. Suspect a small suprapatellar joint effusion.   Electronically Signed   By: Richarda Overlie M.D.   On: 06/18/2014 21:25   Dg Hip Unilat With Pelvis 2-3 Views Right  06/18/2014   CLINICAL DATA:  Fall with right hip pain.  EXAM: RIGHT HIP (WITH PELVIS) 2-3 VIEWS  COMPARISON:  None.  FINDINGS: The pelvic bony ring is intact. Bowel gas and stool in the abdomen and pelvis. The right hip is located without acute fracture. Minimal degenerative changes in the hip joints. Scattered calcifications throughout the pelvic region.  IMPRESSION: No acute bone abnormality in the pelvis or right hip.   Electronically Signed   By: Richarda Overlie M.D.   On: 06/18/2014 21:26   Dg Femur, Min 2 Views Right  06/18/2014   CLINICAL DATA:  Fall and right hip pain.  EXAM: RIGHT FEMUR 2 VIEWS  COMPARISON:  Right hip 06/18/2014  FINDINGS: There is no evidence of fracture or dislocation. Degenerative changes in the right knee.  IMPRESSION: No acute bone abnormality to the right femur.   Electronically Signed   By: Richarda Overlie M.D.   On: 06/18/2014 21:28    CBC  Recent Labs Lab 06/18/14 2120 06/19/14 0726 06/20/14 0604  WBC 8.5 5.2 4.9  HGB 10.7* 10.2* 9.6*  HCT 32.3* 30.7* 28.8*  PLT 269 275 233  MCV 81.4 81.4 80.4  MCH 27.0 27.1 26.8  MCHC 33.1 33.2 33.3  RDW 12.9 13.1 13.2  LYMPHSABS 0.6* 0.6*  --   MONOABS 1.1* 0.8  --   EOSABS 0.0 0.0  --   BASOSABS 0.0 0.0  --     Chemistries   Recent Labs Lab 06/18/14 2120 06/19/14 0726 06/20/14 0604  NA 131* 132* 131*  K 4.4 3.8 4.2  CL 99 99 98  CO2 GLUCOSE 85 167* 79  BUN 31* 25* 27*  CREATININE 1.82* 1.61* 1.61*  CALCIUM 9.0 8.8 8.3*  AST  --  63*  --   ALT  --  26  --   ALKPHOS  --  53  --   BILITOT  --  0.6  --    ------------------------------------------------------------------------------------------------------------------  estimated creatinine clearance is 20.6 mL/min (by C-G formula based on Cr of  1.61). ------------------------------------------------------------------------------------------------------------------  Recent Labs  06/19/14 0726  HGBA1C 7.8*   ------------------------------------------------------------------------------------------------------------------ No results for input(s): CHOL, HDL, LDLCALC, TRIG, CHOLHDL, LDLDIRECT in the last 72 hours. ------------------------------------------------------------------------------------------------------------------ No results for input(s): TSH, T4TOTAL, T3FREE, THYROIDAB in the last 72 hours.  Invalid input(s): FREET3 ------------------------------------------------------------------------------------------------------------------ No results for input(s): VITAMINB12, FOLATE, FERRITIN, TIBC, IRON, RETICCTPCT in the last 72 hours.  Coagulation profile No results for input(s): INR, PROTIME in the last 168 hours.  No results for input(s): DDIMER in the last 72 hours.  Cardiac Enzymes No results for input(s): CKMB, TROPONINI, MYOGLOBIN in the last 168 hours.  Invalid input(s): CK ------------------------------------------------------------------------------------------------------------------ Invalid input(s): POCBNP     Time Spent in minutes   25 minutes   Anay Walter M.D on 06/20/2014 at 6:48 PM  Between 7am to 7pm - Pager - [phone]  After 7pm go to [email or web address] - password [email or web address]  And look for the night coverage person covering for me after hours  [facility] Office  [phone]   **Disclaimer: This note may have been dictated with voice recognition software. Similar sounding words can inadvertently be transcribed and this note may contain transcription errors which may not have been corrected upon publication of note.**

## 2014-06-20 NOTE — Evaluation (Signed)
Physical Therapy Evaluation Patient Details Name: Jeanne FieldRuth Barker MRN: [id number] DOB: 11/27/1923 Today's Date: 06/20/2014   History of Present Illness  Pt is an 81 y.o. female with history of CKD stage III, DM type 2, hyperlipidemia and HTN had a fall at her house when she was trying to get up from the seat. After she fell she was complaining of right lower extremity pain and he was was called. Patient was found to be confused and patient was hypoglycemic. Patient has known history of chronic pain of the right knee for which patient wears brace. Patient also found to have a UTI and was admitted for further management.   Clinical Impression  Pt admitted with above diagnosis. Pt currently with functional limitations due to the deficits listed below (see PT Problem List). At the time of PT eval pt was able to perform transfers and ambulation with min assist +2. Recommend walker next session. Pt will benefit from skilled PT to increase their independence and safety with mobility to allow discharge to the venue listed below.       Follow Up Recommendations Home health PT;Supervision/Assistance - 24 hour    Equipment Recommendations  Other (comment) (Shower chair/tub bench)    Recommendations for Other Services       Precautions / Restrictions Precautions Precautions: Fall Restrictions Weight Bearing Restrictions: No      Mobility  Bed Mobility Overal bed mobility: Needs Assistance Bed Mobility: Supine to Sit     Supine to sit: Min assist     General bed mobility comments: Assist to transition to EOB.   Transfers Overall transfer level: Needs assistance Equipment used: 2 person hand held assist Transfers: Sit to/from Stand Sit to Stand: Min assist;+2 safety/equipment         General transfer comment: Pt was able to power-up to full standing position with min assist +2 for balance and safety.   Ambulation/Gait Ambulation/Gait assistance: Min assist;+2 physical assistance;+2  safety/equipment Ambulation Distance (Feet): 60 Feet Assistive device: 2 person hand held assist Gait Pattern/deviations: Step-through pattern;Decreased stride length;Trunk flexed;Narrow base of support Gait velocity: Decreased Gait velocity interpretation: Below normal speed for age/gender General Gait Details: Pt reports increased pain in R knee during weight bearing. HHA bilaterally and min assist +2 provided for stability.  Stairs            Wheelchair Mobility    Modified Rankin (Stroke Patients Only)       Balance Overall balance assessment: Needs assistance Sitting-balance support: Feet supported;No upper extremity supported Sitting balance-Leahy Scale: Fair     Standing balance support: Bilateral upper extremity supported;During functional activity Standing balance-Leahy Scale: Poor Standing balance comment: Requires UE support and/or assist to maintain standing balance.                              Pertinent Vitals/Pain      Home Living Family/patient expects to be discharged to:: Private residence Living Arrangements: Children Available Help at Discharge: Family;Available 24 hours/day Type of Home: House         Home Equipment: Walker - 2 wheels;Cane - single point      Prior Function Level of Independence: Independent with assistive device(s)         Comments: Son reports that pt was able to perform all ADL's independently and ambulate with either the cane or the walker.      Hand Dominance        Extremity/Trunk  Assessment   Upper Extremity Assessment: Defer to OT evaluation           Lower Extremity Assessment: Generalized weakness;RLE deficits/detail RLE Deficits / Details: Knee pain - ?chronic. Wears brace at home. R knee is swollen and tender to the touch. Pt states it feels like her knee cap is out of place.     Cervical / Trunk Assessment: Kyphotic  Communication   Communication: HOH (Cochlear implant)   Cognition Arousal/Alertness: Awake/alert Behavior During Therapy: WFL for tasks assessed/performed Overall Cognitive Status: Within Functional Limits for tasks assessed                      General Comments      Exercises        Assessment/Plan    PT Assessment Patient needs continued PT services  PT Diagnosis Difficulty walking;Generalized weakness   PT Problem List Decreased strength;Decreased range of motion;Decreased activity tolerance;Decreased balance;Decreased mobility;Decreased knowledge of use of DME;Decreased safety awareness;Decreased knowledge of precautions;Pain  PT Treatment Interventions DME instruction;Gait training;Stair training;Therapeutic activities;Functional mobility training;Therapeutic exercise;Neuromuscular re-education;Patient/family education   PT Goals (Current goals can be found in the Care Plan section) Acute Rehab PT Goals Patient Stated Goal: Return home; decrease knee pain PT Goal Formulation: With patient Time For Goal Achievement: 06/27/14 Potential to Achieve Goals: Good    Frequency Min 3X/week   Barriers to discharge        Co-evaluation               End of Session Equipment Utilized During Treatment: Gait belt Activity Tolerance: Patient limited by fatigue;Patient limited by pain Patient left: in chair;with chair alarm set;with call bell/phone within reach Nurse Communication: Mobility status         Time: 1021-1042 PT Time Calculation (min) (ACUTE ONLY): 21 min   Charges:   PT Evaluation $Initial PT Evaluation Tier I: 1 Procedure     PT G CodesConni Slipper 07/03/2014, 11:54 AM   Conni Slipper, PT, DPT Acute Rehabilitation Services Pager: [phone]

## 2014-06-20 NOTE — Care Management Note (Signed)
CARE MANAGEMENT NOTE 06/20/2014  Patient:  [facility]   Account Number:  [id number]  Date Initiated:  06/20/2014  Documentation initiated by:  Kynslie Ringle  Subjective/Objective Assessment:   CM following for progression and d/c planning.     Action/Plan:   06/20/14 Met with pt and daughter, pt will return to home, per daughter pt has medicare and medicaid. Pt daughter given info on [facility] CAP program and application for PCS to take to pt PCP after d/c. Walker ordered.   Anticipated DC Date:  06/21/2014   Anticipated DC Plan:  [place]         Choice offered to / List presented to:  C-4 Adult Children   DME arranged  [place]      DME agency  [place] arranged  HH-1 RN  [place].   Status of service:  Completed, signed off Medicare Important Message given?  YES (If response is "NO", the following Medicare IM given date fields will be blank) Date Medicare IM given:  06/20/2014 Medicare IM given by:  Jamison Soward Date Additional Medicare IM given:   Additional Medicare IM given by:    Discharge Disposition:  [place]  Per UR Regulation:    If discussed at Long Length of Stay Meetings, dates discussed:    Comments:  06/20/2014 [facility] please contact pt daughter, Gilman Schmidt to schedule home visits as pt is very [facility] and unable hear on the phone.  Ms Sherral Hammers phone numbers are:  Home [phone]  or cell # [phone]. Pt daughter selected [facility] for [facility] services , [facility] notified.  As pt has medicaid her daughter, Ms Sherral Hammers was given info on CAP and PCS programs. CRoyal RN MPH, case manager, [phone]

## 2014-06-20 NOTE — Progress Notes (Signed)
Hypoglycemic Event  CBG: 63  Treatment: 15 GM carbohydrate snack & breakfast  Symptoms: None  Follow-up CBG: Time: 161 CBG Result: 08:24  Possible Reasons for Event: Inadequate meal intake  Comments/MD notified:    Jeanne Barker R  Remember to initiate Hypoglycemia Order Set & complete

## 2014-06-21 DIAGNOSIS — B9620 Unspecified Escherichia coli [E. coli] as the cause of diseases classified elsewhere: Secondary | ICD-10-CM

## 2014-06-21 LAB — BASIC METABOLIC PANEL
Anion gap: 7 (ref 5–15)
BUN: 28 mg/dL — AB (ref 6–23)
CALCIUM: 8.5 mg/dL (ref 8.4–10.5)
CHLORIDE: 99 mmol/L (ref 96–112)
CO2: 25 mmol/L (ref 19–32)
CREATININE: 1.53 mg/dL — AB (ref 0.50–1.10)
GFR, EST AFRICAN AMERICAN: 34 mL/min — AB (ref >90)
GFR, EST NON AFRICAN AMERICAN: 29 mL/min — AB (ref >90)
Glucose, Bld: 179 mg/dL — ABNORMAL HIGH (ref 70–99)
POTASSIUM: 4.6 mmol/L (ref 3.5–5.1)
SODIUM: 131 mmol/L — AB (ref 135–145)

## 2014-06-21 LAB — GLUCOSE, CAPILLARY
GLUCOSE-CAPILLARY: 154 mg/dL — AB (ref 70–99)
GLUCOSE-CAPILLARY: 167 mg/dL — AB (ref 70–99)
GLUCOSE-CAPILLARY: 180 mg/dL — AB (ref 70–99)
GLUCOSE-CAPILLARY: 226 mg/dL — AB (ref 70–99)
Glucose-Capillary: 180 mg/dL — ABNORMAL HIGH (ref 70–99)
Glucose-Capillary: 232 mg/dL — ABNORMAL HIGH (ref 70–99)

## 2014-06-21 LAB — CBC
HEMATOCRIT: 28.8 % — AB (ref 36.0–46.0)
HEMOGLOBIN: 9.6 g/dL — AB (ref 12.0–15.0)
MCH: 26.7 pg (ref 26.0–34.0)
MCHC: 33.3 g/dL (ref 30.0–36.0)
MCV: 80.2 fL (ref 78.0–100.0)
Platelets: 224 10*3/uL (ref 150–400)
RBC: 3.59 MIL/uL — AB (ref 3.87–5.11)
RDW: 13 % (ref 11.5–15.5)
WBC: 4.4 10*3/uL (ref 4.0–10.5)

## 2014-06-21 LAB — URINE CULTURE: Colony Count: >=100000

## 2014-06-21 MED ORDER — CIPROFLOXACIN HCL 500 MG PO TABS: 500.0000 mg | ORAL_TABLET | Freq: Two times a day (BID) | ORAL | Status: DC

## 2014-06-21 MED ORDER — SITAGLIPTIN PHOSPHATE 50 MG PO TABS: 50.0000 mg | ORAL_TABLET | Freq: Every day | ORAL | Status: DC

## 2014-06-21 NOTE — Progress Notes (Signed)
Discharge instructions discussed with patient's daughter an prescriptions given.  All questions answered.

## 2014-06-21 NOTE — Discharge Summary (Signed)
Physician Discharge Summary  Jeanne Barker WUJ:[id number] DOB: 12-03-1923 DOA: 06/18/2014  PCP: [name], MD  Admit date: 06/18/2014 Discharge date: 06/21/2014  Recommendations for Outpatient Follow-up:  Take Cipro for 4 days on discharge for E.Coli UTI  Please either take Amaryl or Januvia on discharge. Januvia may require PCP approval so if you are not able to fill the prescription continue Amaryl on discharge.  Will follow up on sens report and if any report of resistance to E.Coli will notify the patient and will change the antibiotic tx.  Discharge Diagnoses:  Principal Problem:   Hypoglycemia Active Problems:   DMII (diabetes mellitus, type 2)   Chronic kidney disease   UTI (lower urinary tract infection) secondary to E.Coli   Essential hypertension   Hyperlipidemia    Discharge Condition: stable   Diet recommendation: as tolerated   History of present illness:  81 y.o. female with history of chronic kidney disease stage III, diabetes mellitus type 2, hyperlipidemia and hypertension, had a fall at her house when she was trying to get up from the seat. She then started toexperience right lower extremity pain.She was subsequently found to be confused with CBG in 40's on EMS arrival. Her CBG improved to 200 with D50.  On admission, she was hemodynamically stable. CT scan and x ray did not show acute findings. She was found to have UTI and was started on Rocephin. Urine culture grew E.Coli.   Hospital Course:   Assessment & Plan   Principal Problem:  Hypoglycemia - probably exacerbated by UTI - pt has received D50 on 2 occasions - CBG's in past 24 hours: 167, 154, 232 - she should be ok to take Amaryl on discharge. - Januvia only if PCP approved but pt instructed to take only one or the other not both medications   Active Problems:   E.Coli UTI - sens report pending - d/c with cipro for 4 more days - has received rocephin in hospital    DMII (diabetes mellitus,  type 2), uncontrolled iwth renal manifestation - A1c on this admission 7.8 indicating less than goal glycemic control - Would recommend to take amaryl on discharge.    Chronic kidney disease, stage 3 - Creatinine has improved since admission, 1.61 --- > 1.53 - Would continue to hold lasix until renal function little better    Essential hypertension - Continue clonidine on discharge    Dyslipidemia - Continue statin on discharge     Anemia of chronic disease - secondary to CKD - hemoglobin stable.   DVT prophylaxis - hep sub Q in hospital    Code Status: Full Family Communication: daughter at bed side    Procedures  None    Consults  None   Signed:  Manson Passey, MD  [facility] 06/21/2014, 10:05 AM  Pager #: [phone]   Discharge Exam: Filed Vitals:   06/21/14 0921  BP: 125/52  Pulse: 65  Temp: 98.2 F (36.8 C)  Resp: 18   Filed Vitals:   06/20/14 1700 06/20/14 2003 06/21/14 0408 06/21/14 0921  BP: 137/45 133/45 115/46 125/52  Pulse: 67 66 66 65  Temp: 98.8 F (37.1 C) 98.8 F (37.1 C) 98.6 F (37 C) 98.2 F (36.8 C)  TempSrc: Oral Oral Oral Oral  Resp: Weight:  67.7 kg (149 lb 4 oz)    SpO2: 97% 98% 95% 98%    General: Pt is alert, follows commands appropriately, not in acute distress Cardiovascular: Regular rate and  rhythm, S1/S2 + Respiratory: Clear to auscultation bilaterally, no wheezing, no crackles, no rhonchi Abdominal: Soft, non tender, non distended, bowel sounds +, no guarding Extremities: no edema, no cyanosis, pulses palpable bilaterally DP and PT Neuro: Grossly nonfocal  Discharge Instructions  Discharge Instructions    Call MD for:  difficulty breathing, headache or visual disturbances    Complete by:  As directed      Call MD for:  persistant dizziness or light-headedness    Complete by:  As directed      Call MD for:  persistant nausea and vomiting    Complete by:  As directed      Call MD  for:  severe uncontrolled pain    Complete by:  As directed      Diet - low sodium heart healthy    Complete by:  As directed      Discharge instructions    Complete by:  As directed   Take Cipro for 4 days on discharge for E.Coli UTI  Please either take Amaryl or Januvia on discharge. Januvia may require PCP approval so if you are not able to fill the prescription continue Amaryl on discharge.     Increase activity slowly    Complete by:  As directed             Medication List    STOP taking these medications        furosemide 20 MG tablet  Commonly known as:  LASIX     glucose blood test strip      TAKE these medications        acetaminophen 500 MG tablet  Commonly known as:  TYLENOL  Take 500 mg by mouth every 6 (six) hours as needed.     aspirin 81 MG tablet  Take 81 mg by mouth daily.     ciprofloxacin 500 MG tablet  Commonly known as:  CIPRO  Take 1 tablet (500 mg total) by mouth 2 (two) times daily.     cloNIDine 0.1 MG tablet  Commonly known as:  CATAPRES  TAKE 1 TABLET BY MOUTH TWICE DAILY     glimepiride 2 MG tablet  Commonly known as:  AMARYL  TAKE 2 TABLETS BY MOUTH DAILY     ONE TOUCH LANCETS Misc  by Does not apply route.     simvastatin 40 MG tablet  Commonly known as:  ZOCOR  TAKE 1 TABLET BY MOUTH EVERY NIGHT AT BEDTIME     sitaGLIPtin 50 MG tablet  Commonly known as:  JANUVIA  Take 1 tablet (50 mg total) by mouth daily.     trolamine salicylate 10 % cream  Commonly known as:  ASPERCREME  Apply topically as needed.     vitamin B-12 100 MCG tablet  Commonly known as:  CYANOCOBALAMIN  Take 100 mcg by mouth daily.           Follow-up Information    Follow up with [name], MD. Schedule an appointment as soon as possible for a visit in 1 week.   Specialty:  Internal Medicine   Why:  Follow up appt after recent hospitalization   Contact information:   [address] [place] [id number] [phone]         The results of significant diagnostics from this hospitalization (including imaging, microbiology, ancillary and laboratory) are listed below for reference.    Significant Diagnostic Studies: Dg Chest 2 View  06/18/2014   CLINICAL DATA:  Two day history of cough.  EXAM: CHEST  2 VIEW  COMPARISON:  None.  FINDINGS: The cardiac silhouette, mediastinal and hilar contours are within normal limits for age. There are chronic appearing bronchitic changes and interstitial thickening. No definite infiltrates or effusions. The bony thorax is grossly normal.  IMPRESSION: Chronic appearing bronchitic changes without definite infiltrates or effusions.   Electronically Signed   By: Rudie Meyer M.D.   On: 06/18/2014 21:29   Ct Knee Right Wo Contrast  06/19/2014   CLINICAL DATA:  81 year old female with right knee pain after fall.  EXAM: CT OF THE RIGHT KNEE WITHOUT CONTRAST  TECHNIQUE: Multidetector CT imaging of the RIGHT knee was performed according to the standard protocol. Multiplanar CT image reconstructions were also generated.  COMPARISON:  Right knee radiographs earlier this day.  FINDINGS: Multiple image acquisitions due to patient motion. No acute fracture. There is moderate tricompartmental osteoarthritis with tricompartmental joint space narrowing and peripheral spurring. Fragmented osteophyte at the tibial spine. There is a small joint effusion. No lipohemarthrosis. No Baker cyst. No focal soft tissue edema.  IMPRESSION: Moderate tricompartmental osteoarthritis without acute fracture.   Electronically Signed   By: Rubye Oaks M.D.   On: 06/19/2014 00:40   Ct Hip Right Wo Contrast  06/19/2014   CLINICAL DATA:  Right hip pain after a fall.  EXAM: CT OF THE RIGHT HIP WITHOUT CONTRAST  TECHNIQUE: Multidetector CT imaging of the right hip was performed according to the standard protocol. Multiplanar CT image reconstructions were also generated.  COMPARISON:  Right femur 06/18/2014  FINDINGS: The  right hip appears intact. No acute displaced fractures identified. No focal bone lesion or bone destruction. Bone cortex and trabecular architecture appear intact. Visualized right pelvis and acetabulum appear intact. Degenerative changes at the right sacroiliac joint. Soft tissues are unremarkable. Foley catheter in the bladder. Diverticulosis of the sigmoid colon.  IMPRESSION: No acute displaced right hip fractures identified.   Electronically Signed   By: Burman Nieves M.D.   On: 06/19/2014 00:38   Dg Knee Complete 4 Views Right  06/18/2014   CLINICAL DATA:  Fall with right knee pain.  EXAM: RIGHT KNEE - COMPLETE 4+ VIEW  COMPARISON:  None.  FINDINGS: Osteophytes and spurring in the medial, lateral and patellofemoral compartments. Negative for a fracture or dislocation. Evidence for a small suprapatellar joint effusion.  IMPRESSION: Moderate osteoarthritis in the right knee without acute bone abnormality. Suspect a small suprapatellar joint effusion.   Electronically Signed   By: Richarda Overlie M.D.   On: 06/18/2014 21:25   Dg Hip Unilat With Pelvis 2-3 Views Right  06/18/2014   CLINICAL DATA:  Fall with right hip pain.  EXAM: RIGHT HIP (WITH PELVIS) 2-3 VIEWS  COMPARISON:  None.  FINDINGS: The pelvic bony ring is intact. Bowel gas and stool in the abdomen and pelvis. The right hip is located without acute fracture. Minimal degenerative changes in the hip joints. Scattered calcifications throughout the pelvic region.  IMPRESSION: No acute bone abnormality in the pelvis or right hip.   Electronically Signed   By: Richarda Overlie M.D.   On: 06/18/2014 21:26   Dg Femur, Min 2 Views Right  06/18/2014   CLINICAL DATA:  Fall and right hip pain.  EXAM: RIGHT FEMUR 2 VIEWS  COMPARISON:  Right hip 06/18/2014  FINDINGS: There is no evidence of fracture or dislocation. Degenerative changes in the right knee.  IMPRESSION: No acute bone abnormality to the right femur.  Electronically Signed   By: Richarda Overlie M.D.   On:  06/18/2014 21:28    Microbiology: Recent Results (from the past 240 hour(s))  Urine culture     Status: None (Preliminary result)   Collection Time: 06/18/14 10:10 PM  Result Value Ref Range Status   Specimen Description URINE, CLEAN CATCH  Final   Special Requests NONE  Final   Colony Count   Final    >=100,000 COLONIES/ML Performed at [facility]    Culture   Final    ESCHERICHIA COLI Performed at [facility]    Report Status PENDING  Incomplete     Labs: Basic Metabolic Panel:  Recent Labs Lab 06/18/14 2120 06/19/14 0726 06/20/14 0604 06/21/14 0555  NA 131* 132* 131* 131*  K 4.4 3.8 4.2 4.6  CL 99 99 98 99  CO2 GLUCOSE 85 167* 79 179*  BUN 31* 25* 27* 28*  CREATININE 1.82* 1.61* 1.61* 1.53*  CALCIUM 9.0 8.8 8.3* 8.5   Liver Function Tests:  Recent Labs Lab 06/19/14 0726  AST 63*  ALT 26  ALKPHOS 53  BILITOT 0.6  PROT 6.4  ALBUMIN 3.0*   No results for input(s): LIPASE, AMYLASE in the last 168 hours. No results for input(s): AMMONIA in the last 168 hours. CBC:  Recent Labs Lab 06/18/14 2120 06/19/14 0726 06/20/14 0604 06/21/14 0555  WBC 8.5 5.2 4.9 4.4  NEUTROABS 6.8 3.8  --   --   HGB 10.7* 10.2* 9.6* 9.6*  HCT 32.3* 30.7* 28.8* 28.8*  MCV 81.4 81.4 80.4 80.2  PLT 269 275 233 224   Cardiac Enzymes: No results for input(s): CKTOTAL, CKMB, CKMBINDEX, TROPONINI in the last 168 hours. BNP: BNP (last 3 results) No results for input(s): BNP in the last 8760 hours.  ProBNP (last 3 results) No results for input(s): PROBNP in the last 8760 hours.  CBG:  Recent Labs Lab 06/21/14 0008 06/21/14 0207 06/21/14 0405 06/21/14 0619 06/21/14 0805  GLUCAP 226* 180* 180* 167* 154*    Time coordinating discharge: Over 30 minutes

## 2014-06-21 NOTE — Discharge Instructions (Signed)

## 2014-06-23 DIAGNOSIS — E785 Hyperlipidemia, unspecified: Secondary | ICD-10-CM | POA: Diagnosis not present

## 2014-06-23 DIAGNOSIS — N390 Urinary tract infection, site not specified: Secondary | ICD-10-CM | POA: Diagnosis not present

## 2014-06-23 DIAGNOSIS — N183 Chronic kidney disease, stage 3 (moderate): Secondary | ICD-10-CM | POA: Diagnosis not present

## 2014-06-23 DIAGNOSIS — I129 Hypertensive chronic kidney disease with stage 1 through stage 4 chronic kidney disease, or unspecified chronic kidney disease: Secondary | ICD-10-CM | POA: Diagnosis not present

## 2014-06-23 DIAGNOSIS — R5383 Other fatigue: Secondary | ICD-10-CM | POA: Diagnosis not present

## 2014-06-23 DIAGNOSIS — Z9181 History of falling: Secondary | ICD-10-CM | POA: Diagnosis not present

## 2014-06-23 DIAGNOSIS — H9190 Unspecified hearing loss, unspecified ear: Secondary | ICD-10-CM | POA: Diagnosis not present

## 2014-06-23 DIAGNOSIS — E11649 Type 2 diabetes mellitus with hypoglycemia without coma: Secondary | ICD-10-CM | POA: Diagnosis not present

## 2014-06-23 NOTE — Progress Notes (Signed)
Appointment scheduled for 07/31/14 per daughter request.

## 2014-06-24 ENCOUNTER — Ambulatory Visit: Payer: Medicare Other | Admitting: Internal Medicine

## 2014-06-24 DIAGNOSIS — M25561 Pain in right knee: Secondary | ICD-10-CM | POA: Diagnosis not present

## 2014-06-24 DIAGNOSIS — M13861 Other specified arthritis, right knee: Secondary | ICD-10-CM | POA: Diagnosis not present

## 2014-06-24 DIAGNOSIS — M25461 Effusion, right knee: Secondary | ICD-10-CM | POA: Diagnosis not present

## 2014-06-25 ENCOUNTER — Telehealth: Payer: Self-pay | Admitting: Internal Medicine

## 2014-06-25 DIAGNOSIS — E785 Hyperlipidemia, unspecified: Secondary | ICD-10-CM | POA: Diagnosis not present

## 2014-06-25 DIAGNOSIS — N183 Chronic kidney disease, stage 3 (moderate): Secondary | ICD-10-CM | POA: Diagnosis not present

## 2014-06-25 DIAGNOSIS — I129 Hypertensive chronic kidney disease with stage 1 through stage 4 chronic kidney disease, or unspecified chronic kidney disease: Secondary | ICD-10-CM | POA: Diagnosis not present

## 2014-06-25 DIAGNOSIS — N390 Urinary tract infection, site not specified: Secondary | ICD-10-CM | POA: Diagnosis not present

## 2014-06-25 DIAGNOSIS — E11649 Type 2 diabetes mellitus with hypoglycemia without coma: Secondary | ICD-10-CM | POA: Diagnosis not present

## 2014-06-25 DIAGNOSIS — R5383 Other fatigue: Secondary | ICD-10-CM | POA: Diagnosis not present

## 2014-06-25 NOTE — Telephone Encounter (Signed)
Please advise 

## 2014-06-25 NOTE — Telephone Encounter (Signed)
Please ask how many glimeperide 2 mg is she taking. Most likely will need insulin.

## 2014-06-25 NOTE — Telephone Encounter (Signed)
Caller name:akiko from [facility] Relation to pt: Call back number: [phone] Pharmacy:  Reason for call:   Calling in regarding glucose reading 395.

## 2014-06-26 ENCOUNTER — Encounter: Payer: Self-pay | Admitting: Internal Medicine

## 2014-06-26 ENCOUNTER — Telehealth: Payer: Self-pay | Admitting: Internal Medicine

## 2014-06-26 DIAGNOSIS — I129 Hypertensive chronic kidney disease with stage 1 through stage 4 chronic kidney disease, or unspecified chronic kidney disease: Secondary | ICD-10-CM | POA: Diagnosis not present

## 2014-06-26 DIAGNOSIS — E11649 Type 2 diabetes mellitus with hypoglycemia without coma: Secondary | ICD-10-CM | POA: Diagnosis not present

## 2014-06-26 DIAGNOSIS — N183 Chronic kidney disease, stage 3 (moderate): Secondary | ICD-10-CM | POA: Diagnosis not present

## 2014-06-26 DIAGNOSIS — E785 Hyperlipidemia, unspecified: Secondary | ICD-10-CM | POA: Diagnosis not present

## 2014-06-26 DIAGNOSIS — R5383 Other fatigue: Secondary | ICD-10-CM | POA: Diagnosis not present

## 2014-06-26 DIAGNOSIS — N390 Urinary tract infection, site not specified: Secondary | ICD-10-CM | POA: Diagnosis not present

## 2014-06-26 NOTE — Telephone Encounter (Signed)
[facility] Inquiring about compliance with Pt's Glimepiride. Informed her to call back if Pt's glucose is still elevated.

## 2014-06-26 NOTE — Telephone Encounter (Signed)
Pt was no show for hospital follow up on 06/24/14. Letter sent- would you like me to attempt to reschedule? Charge no show?

## 2014-06-26 NOTE — Telephone Encounter (Signed)
No, don't charge

## 2014-06-27 DIAGNOSIS — N390 Urinary tract infection, site not specified: Secondary | ICD-10-CM | POA: Diagnosis not present

## 2014-06-27 DIAGNOSIS — N183 Chronic kidney disease, stage 3 (moderate): Secondary | ICD-10-CM | POA: Diagnosis not present

## 2014-06-27 DIAGNOSIS — R5383 Other fatigue: Secondary | ICD-10-CM | POA: Diagnosis not present

## 2014-06-27 DIAGNOSIS — I129 Hypertensive chronic kidney disease with stage 1 through stage 4 chronic kidney disease, or unspecified chronic kidney disease: Secondary | ICD-10-CM | POA: Diagnosis not present

## 2014-06-27 DIAGNOSIS — E785 Hyperlipidemia, unspecified: Secondary | ICD-10-CM | POA: Diagnosis not present

## 2014-06-27 DIAGNOSIS — E11649 Type 2 diabetes mellitus with hypoglycemia without coma: Secondary | ICD-10-CM | POA: Diagnosis not present

## 2014-06-29 ENCOUNTER — Other Ambulatory Visit: Payer: Self-pay | Admitting: Internal Medicine

## 2014-06-30 ENCOUNTER — Telehealth: Payer: Self-pay | Admitting: Internal Medicine

## 2014-06-30 ENCOUNTER — Ambulatory Visit (INDEPENDENT_AMBULATORY_CARE_PROVIDER_SITE_OTHER): Payer: Medicare Other | Admitting: Internal Medicine

## 2014-06-30 ENCOUNTER — Other Ambulatory Visit: Payer: Self-pay

## 2014-06-30 ENCOUNTER — Encounter: Payer: Self-pay | Admitting: Internal Medicine

## 2014-06-30 VITALS — BP 124/66 | HR 82 | Temp 97.9°F | Ht 61.0 in | Wt 135.5 lb

## 2014-06-30 DIAGNOSIS — N390 Urinary tract infection, site not specified: Secondary | ICD-10-CM

## 2014-06-30 DIAGNOSIS — E119 Type 2 diabetes mellitus without complications: Secondary | ICD-10-CM | POA: Diagnosis not present

## 2014-06-30 MED ORDER — RELION ULTRA THIN PLUS LANCETS MISC: Status: DC

## 2014-06-30 MED ORDER — GLUCOSE BLOOD VI STRP: ORAL_STRIP | Status: DC

## 2014-06-30 NOTE — Telephone Encounter (Signed)
Rx for test strips and lancets resent.

## 2014-06-30 NOTE — Patient Instructions (Signed)
You are doing very well  Take only 1 tablet of glimepiride a day.  Continue checking your blood sugars at least once a day, if they are going less than 110, please let us know  Next visit in 3 months

## 2014-06-30 NOTE — Telephone Encounter (Signed)
Caller name:  mollly Relation to pt: daughter Call back number: [phone] Pharmacy: [facility] on [address]  Reason for call:   [name] states that the pharmacy did not receive rx that was sent today. Please resend. Patient is there now

## 2014-06-30 NOTE — Assessment & Plan Note (Signed)
Recently admitted to the hospital after episode of hypoglycemia in the context of a UTI. Currently taking Amaryl 2 mg 2 tablets daily and Januvia, last A1c 7.8. Plan: Continue Januvia, decrease Amaryl to 1 tablet daily. A1c goal between 8 and 9.

## 2014-06-30 NOTE — Progress Notes (Signed)
Pre visit review using our clinic review tool, if applicable. No additional management support is needed unless otherwise documented below in the visit note. 

## 2014-06-30 NOTE — Progress Notes (Signed)
Subjective:    Patient ID: Jeanne Barker, female    DOB: 03-07-1924, 81 y.o.   MRN: [id number]  DOS:  06/30/2014 Type of visit - description :  Interval history:  Admitted 06/18/2014 for 3 days.Excerpts    History of present illness:    had a fall at her house when she was trying to get up from the seat. She then started to experience right lower extremity pain.She was subsequently found to be confused with CBG in 40's on EMS arrival. Her CBG improved to 200 with D50.  On admission, she was hemodynamically stable. CT scan and x ray did not show acute findings. She was found to have UTI and was started on Rocephin. Urine culture grew E.Coli.   Hospital Course:   Assessment & Plan   Principal Problem:  Hypoglycemia - probably exacerbated by UTI - pt has received D50 on 2 occasions - CBG's in past 24 hours: 167, 154, 232 - she should be ok to take Amaryl on discharge. - Januvia only if PCP approved but pt instructed to take only one or the other not both medications   Active Problems:  E.Coli UTI - sens report pending - d/c with cipro for 4 more days - has received rocephin in hospital    DMII (diabetes mellitus, type 2), uncontrolled iwth renal manifestation - A1c on this admission 7.8 indicating less than goal glycemic control - Would recommend to take amaryl on discharge.    Chronic kidney disease, stage 3 - Creatinine has improved since admission, 1.61 --- > 1.53 - Would continue to hold lasix until renal function little better    Essential hypertension - Continue clonidine on discharge   Dyslipidemia - Continue statin on discharge    Anemia of chronic disease - secondary to CKD - hemoglobin stable.   DVT prophylaxis - hep sub Q in hospital    Code Status: Full Family Communication: daughter at bed side          Review of Systems She is here with her daughter, since she left the hospital she is feeling well. " I feel terrific ", likes  to go back to work [employer] her antibiotics. Ambulatory blood sugars are checked on and off, yesterday CBG was 175 Denies fever or chills No nausea, vomiting, diarrhea No dysuria, dysarthria difficulty urinating. Appetite is back to normal  Past Medical History  Diagnosis Date  . Anemia   . Diabetes mellitus   . Hyperlipidemia   . Chronic renal insufficiency   . Hard of hearing   . Syphilis     ?latent?, treated c/ PCN 1993  . Osteoarthritis   . Hypertension     Past Surgical History  Procedure Laterality Date  . Cochlear impalnt  1/02  . Abdominal hysterectomy      BSO  . Nephrectomy      left  . Oophorectomy    . Cataract extraction      History   Social History  . Marital Status: Single    Spouse Name: N/A  . Number of Children: 5  . Years of Education: N/A   Occupational History  . PT job   .     Social History Main Topics  . Smoking status: Former Games developer  . Smokeless tobacco: Never Used  . Alcohol Use: No  . Drug Use: Not on file  . Sexual Activity: No   Other Topics Concern  . Not on file   Social History Narrative   Son  lives with her , still drives.   Her daughter [name] [phone]  lives 20 minutes away and usually comes w/ the pt to the office visit        Medication List       This list is accurate as of: 06/30/14  3:21 PM.  Always use your most recent med list.               acetaminophen 500 MG tablet  Commonly known as:  TYLENOL  Take 500 mg by mouth every 6 (six) hours as needed.     aspirin 81 MG tablet  Take 81 mg by mouth daily.     ciprofloxacin 500 MG tablet  Commonly known as:  CIPRO  Take 1 tablet (500 mg total) by mouth 2 (two) times daily.     cloNIDine 0.1 MG tablet  Commonly known as:  CATAPRES  TAKE 1 TABLET BY MOUTH TWICE DAILY     glimepiride 2 MG tablet  Commonly known as:  AMARYL  TAKE 2 TABLETS BY MOUTH DAILY     glucose blood test strip  Commonly known as:  RELION GLUCOSE TEST STRIPS  Check no  more than twice daily.     RELION ULTRA THIN PLUS LANCETS Misc  Check no more than twice daily     simvastatin 40 MG tablet  Commonly known as:  ZOCOR  TAKE 1 TABLET BY MOUTH EVERY NIGHT AT BEDTIME     sitaGLIPtin 50 MG tablet  Commonly known as:  JANUVIA  Take 1 tablet (50 mg total) by mouth daily.     trolamine salicylate 10 % cream  Commonly known as:  ASPERCREME  Apply topically as needed.     vitamin B-12 100 MCG tablet  Commonly known as:  CYANOCOBALAMIN  Take 100 mcg by mouth daily.           Objective:   Physical Exam BP 124/66 mmHg  Pulse 82  Temp(Src) 97.9 F (36.6 C) (Oral)  Ht 5\' 1"  (1.549 m)  Wt 135 lb 8 oz (61.462 kg)  BMI 25.62 kg/m2  SpO2 99%  General:   Well developed, well nourished . NAD.   HEENT:  Normocephalic . Face symmetric, atraumatic Lungs:  CTA B Normal respiratory effort, no intercostal retractions, no accessory muscle use. Heart: RRR,  no murmur.  Abdomen:  Not distended, soft, non-tender. No rebound or rigidity.   Muscle skeletal: no pretibial edema bilaterally  Skin: Exposed areas without rash. Not pale. Not jaundice Neurologic:  alert & oriented X3.  Speech normal, gait appropriate for age and assisted, at baseline Strength symmetric and appropriate for age.  Psych: Behavior appropriate. No anxious or depressed appearing.    Assessment & Plan:    UTI, status post Cipro, urine culture showed Escherichia coli sensitive to Cipro. No further antibiotics needed at this time.  Hypertension, well controlled

## 2014-07-01 ENCOUNTER — Telehealth: Payer: Self-pay | Admitting: Internal Medicine

## 2014-07-01 NOTE — Telephone Encounter (Signed)
Please advise 

## 2014-07-01 NOTE — Telephone Encounter (Signed)
Caller name:[facility] Relation to pt: Call back number:[phone] Pharmacy:  Reason for call: [facility] states the pt informed them the dr. Drue NovelPaz informed her that she was released to go back to work and did not need home health services anymore. [facility] needs verification.

## 2014-07-02 ENCOUNTER — Telehealth: Payer: Self-pay | Admitting: *Deleted

## 2014-07-02 NOTE — Telephone Encounter (Signed)
The patient was doing very well when I saw her, both she and her daughter feel that she is ready to go back to work. That is okay.

## 2014-07-02 NOTE — Telephone Encounter (Signed)
Received home health certification and plan of care via fax from [facility]. Forwarded to Dr. Paz. JG//CMA 

## 2014-07-02 NOTE — Telephone Encounter (Signed)
LMOM informing Jina, that Dr. Drue NovelPaz has released Pt to go back to work and okay to stop home health services. Informed Jina to return call if she has any questions.

## 2014-07-16 ENCOUNTER — Other Ambulatory Visit: Payer: Self-pay

## 2014-07-29 ENCOUNTER — Other Ambulatory Visit: Payer: Self-pay | Admitting: Internal Medicine

## 2014-08-18 ENCOUNTER — Other Ambulatory Visit: Payer: Self-pay

## 2014-08-18 ENCOUNTER — Other Ambulatory Visit: Payer: Self-pay | Admitting: Internal Medicine

## 2014-08-19 ENCOUNTER — Other Ambulatory Visit: Payer: Self-pay | Admitting: Internal Medicine

## 2014-08-25 ENCOUNTER — Telehealth: Payer: Self-pay | Admitting: Internal Medicine

## 2014-08-25 ENCOUNTER — Other Ambulatory Visit: Payer: Self-pay

## 2014-08-25 DIAGNOSIS — N189 Chronic kidney disease, unspecified: Secondary | ICD-10-CM

## 2014-08-25 NOTE — Telephone Encounter (Signed)
Caller name: mollie woods Relation to pt: daughter Call back number:  [phone] Pharmacy:  Reason for call:   Wants to know if Dr. Drue NovelPaz had taken patient off of furosemide?

## 2014-08-25 NOTE — Telephone Encounter (Signed)
Pt's daughter, Jeanne Barker, called regarding Pt's Lasix. She wanted to know if we d/c medication. I see in OV notes from 06/2014 that she was to hold Lasix until renal function is better. Pt has appt next month in June, should she still be holding medication until visit? Please advise.

## 2014-08-25 NOTE — Telephone Encounter (Signed)
1. Please schedule a BMP, DX chronic renal insufficiency 2. She taking Lasix currently? Let me know

## 2014-08-25 NOTE — Telephone Encounter (Signed)
BMP future ordered.  

## 2014-08-25 NOTE — Telephone Encounter (Signed)
Thank you, we need to know for sure whether or not she is taking Lasix as well

## 2014-08-25 NOTE — Telephone Encounter (Signed)
Spoke with MosierMollie, Pt's daughter, informed her that Lasix was to be held until her next renal function check. Asked Mollie if Pt has been taking Lasix, Mollie stated she was not entirely sure but believes she has. Informed her that Dr. Drue NovelPaz would like to have BMP checked. Mollie scheduled appt for 08/26/2014 at 0915 for labs. Informed her depending on lab results Dr. Drue NovelPaz may or may not restart Lasix. Mollie verbalized understanding.

## 2014-08-26 ENCOUNTER — Other Ambulatory Visit (INDEPENDENT_AMBULATORY_CARE_PROVIDER_SITE_OTHER): Payer: Medicare Other

## 2014-08-26 DIAGNOSIS — N289 Disorder of kidney and ureter, unspecified: Secondary | ICD-10-CM

## 2014-08-26 DIAGNOSIS — N189 Chronic kidney disease, unspecified: Secondary | ICD-10-CM | POA: Diagnosis not present

## 2014-08-26 LAB — BASIC METABOLIC PANEL
BUN: 17 mg/dL (ref 6–23)
CHLORIDE: 98 meq/L (ref 96–112)
CO2: 28 meq/L (ref 19–32)
CREATININE: 1.35 mg/dL — AB (ref 0.40–1.20)
Calcium: 9.9 mg/dL (ref 8.4–10.5)
GFR: 47.48 mL/min — AB (ref >60.00)
Glucose, Bld: 76 mg/dL (ref 70–99)
Potassium: 4.2 mEq/L (ref 3.5–5.1)
SODIUM: 131 meq/L — AB (ref 135–145)

## 2014-08-26 NOTE — Telephone Encounter (Signed)
Pt has been taking Lasix as prescribed, Pt informed per Dr. Drue NovelPaz to continue taking Lasix. Further advice when labs return.

## 2014-08-27 ENCOUNTER — Other Ambulatory Visit: Payer: Self-pay

## 2014-08-27 MED ORDER — FUROSEMIDE 20 MG PO TABS: 20.0000 mg | ORAL_TABLET | Freq: Every day | ORAL | Status: DC

## 2014-08-27 NOTE — Telephone Encounter (Signed)
See lab results from 08/26/2014, okay for Pt to continue Lasix 20 mg 1 tablet daily ([facility] per Dr. Drue NovelPaz.

## 2014-09-10 ENCOUNTER — Telehealth: Payer: Self-pay | Admitting: Internal Medicine

## 2014-09-10 NOTE — Telephone Encounter (Signed)
Caller name: Victorino DikeJennifer from [facility] a department within [facility]  Call back number: [phone]    Reason for call:   Victorino DikeJennifer states form was faxed over in March requesting skilled nursing. Advised her form was never received. [facility] will re fax form to [phone] Attention Saintclair HalstedJessica G.

## 2014-09-11 NOTE — Telephone Encounter (Signed)
Order was received and is currently with Dr. Drue NovelPaz. JG//CMA

## 2014-09-13 ENCOUNTER — Other Ambulatory Visit: Payer: Self-pay | Admitting: Internal Medicine

## 2014-09-15 NOTE — Telephone Encounter (Signed)
Fax not received as of 3:50 pm today.

## 2014-09-15 NOTE — Telephone Encounter (Signed)
Ok I'll sign ASAP, please bring me the form (or ask them to refax)

## 2014-09-15 NOTE — Telephone Encounter (Signed)
Contacted Victorino DikeJennifer to please refax order.

## 2014-09-15 NOTE — Telephone Encounter (Signed)
FYI. [facility] called needing this ASAP.

## 2014-09-15 NOTE — Telephone Encounter (Signed)
[facility] Ph# [phone] Fax# [phone] Attn: Victorino DikeJennifer  She is following up on order to discontinue skilled nursing that was faxed 09/10/14. She is requesting it be signed and returned asap. Please call her with questions.

## 2014-09-18 NOTE — Telephone Encounter (Signed)
Received order via fax from [facility]. Forwarded to Dr. Paz for signature. JG//CMA  

## 2014-09-19 NOTE — Telephone Encounter (Signed)
Faxed to [facility] successfully

## 2014-09-22 ENCOUNTER — Encounter: Payer: Self-pay | Admitting: Internal Medicine

## 2014-09-22 ENCOUNTER — Ambulatory Visit (INDEPENDENT_AMBULATORY_CARE_PROVIDER_SITE_OTHER): Payer: Medicare Other | Admitting: Internal Medicine

## 2014-09-22 VITALS — BP 118/66 | HR 75 | Temp 97.5°F | Ht 61.0 in | Wt 138.2 lb

## 2014-09-22 DIAGNOSIS — I10 Essential (primary) hypertension: Secondary | ICD-10-CM | POA: Diagnosis not present

## 2014-09-22 DIAGNOSIS — D649 Anemia, unspecified: Secondary | ICD-10-CM

## 2014-09-22 DIAGNOSIS — E119 Type 2 diabetes mellitus without complications: Secondary | ICD-10-CM | POA: Diagnosis not present

## 2014-09-22 DIAGNOSIS — D631 Anemia in chronic kidney disease: Secondary | ICD-10-CM

## 2014-09-22 DIAGNOSIS — N189 Chronic kidney disease, unspecified: Secondary | ICD-10-CM

## 2014-09-22 LAB — CBC WITH DIFFERENTIAL/PLATELET
Basophils Absolute: 0.2 10*3/uL — ABNORMAL HIGH (ref 0.0–0.1)
Basophils Relative: 2.7 % (ref 0.0–3.0)
Eosinophils Absolute: 0.1 10*3/uL (ref 0.0–0.7)
Eosinophils Relative: 1.1 % (ref 0.0–5.0)
HCT: 31.8 % — ABNORMAL LOW (ref 36.0–46.0)
Hemoglobin: 10.3 g/dL — ABNORMAL LOW (ref 12.0–15.0)
LYMPHS ABS: 1.6 10*3/uL (ref 0.7–4.0)
Lymphocytes Relative: 27.1 % (ref 12.0–46.0)
MCHC: 32.3 g/dL (ref 30.0–36.0)
MCV: 83.8 fl (ref 78.0–100.0)
MONO ABS: 0.6 10*3/uL (ref 0.1–1.0)
Monocytes Relative: 9.3 % (ref 3.0–12.0)
NEUTROS ABS: 3.6 10*3/uL (ref 1.4–7.7)
Neutrophils Relative %: 59.8 % (ref 43.0–77.0)
Platelets: 343 10*3/uL (ref 150.0–400.0)
RBC: 3.79 Mil/uL — AB (ref 3.87–5.11)
RDW: 14.2 % (ref 11.5–15.5)
WBC: 6 10*3/uL (ref 4.0–10.5)

## 2014-09-22 LAB — BASIC METABOLIC PANEL
BUN: 21 mg/dL (ref 6–23)
CALCIUM: 9.5 mg/dL (ref 8.4–10.5)
CO2: 29 meq/L (ref 19–32)
Chloride: 100 mEq/L (ref 96–112)
Creatinine, Ser: 1.6 mg/dL — ABNORMAL HIGH (ref 0.40–1.20)
GFR: 39.02 mL/min — AB (ref >60.00)
Glucose, Bld: 276 mg/dL — ABNORMAL HIGH (ref 70–99)
Potassium: 4.4 mEq/L (ref 3.5–5.1)
SODIUM: 133 meq/L — AB (ref 135–145)

## 2014-09-22 LAB — HEMOGLOBIN A1C: Hgb A1c MFr Bld: 7.7 % — ABNORMAL HIGH (ref 4.6–6.5)

## 2014-09-22 MED ORDER — GLIMEPIRIDE 2 MG PO TABS: 2.0000 mg | ORAL_TABLET | Freq: Every day | ORAL | Status: DC

## 2014-09-22 MED ORDER — CLONIDINE HCL 0.1 MG PO TABS: 0.1000 mg | ORAL_TABLET | Freq: Two times a day (BID) | ORAL | Status: DC

## 2014-09-22 NOTE — Assessment & Plan Note (Signed)
Long history of anemia, last hemoglobin is slightly decreased, check a CBC

## 2014-09-22 NOTE — Assessment & Plan Note (Signed)
Check a BMP

## 2014-09-22 NOTE — Progress Notes (Signed)
Pre visit review using our clinic review tool, if applicable. No additional management support is needed unless otherwise documented below in the visit note. 

## 2014-09-22 NOTE — Patient Instructions (Signed)
Get your blood work before you leave    

## 2014-09-22 NOTE — Assessment & Plan Note (Addendum)
Seems controlled , check a BMP 

## 2014-09-22 NOTE — Assessment & Plan Note (Signed)
Reports good compliance with medications, denies to me symptoms of low blood sugar, labs

## 2014-09-22 NOTE — Progress Notes (Signed)
Subjective:    Patient ID: Jeanne Barker, female    DOB: 29-Mar-1924, 81 y.o.   MRN: [id number]  DOS:  09/22/2014 Type of visit - description : ROV.here w/  molly Interval history: Feeling very well, no major concerns. For a while she had the home health nurses visiting her but now that is not necessary. Continue to live independently and still works. Good compliance medication, CBGs in the 170s? They are check very seldom.   Review of Systems No fever chills No chest pain or difficulty breathing No nausea, vomiting, diarrhea  Past Medical History  Diagnosis Date  . Anemia   . Diabetes mellitus   . Hyperlipidemia   . Chronic renal insufficiency   . Hard of hearing   . Syphilis     ?latent?, treated c/ PCN 1993  . Osteoarthritis   . Hypertension     Past Surgical History  Procedure Laterality Date  . Cochlear impalnt  1/02  . Abdominal hysterectomy      BSO  . Nephrectomy      left  . Oophorectomy    . Cataract extraction      History   Social History  . Marital Status: Single    Spouse Name: N/A  . Number of Children: 5  . Years of Education: N/A   Occupational History  . PT job   .     Social History Main Topics  . Smoking status: Former Games developer  . Smokeless tobacco: Never Used  . Alcohol Use: No  . Drug Use: Not on file  . Sexual Activity: No   Other Topics Concern  . Not on file   Social History Narrative   Son lives with her , still drives.   Her daughter Kirt Boys [phone])  lives 20 minutes away and usually comes w/ the pt to the office visit        Medication List       This list is accurate as of: 09/22/14  6:07 PM.  Always use your most recent med list.               acetaminophen 500 MG tablet  Commonly known as:  TYLENOL  Take 500 mg by mouth every 6 (six) hours as needed.     aspirin 81 MG tablet  Take 81 mg by mouth daily.     cloNIDine 0.1 MG tablet  Commonly known as:  CATAPRES  Take 1 tablet (0.1 mg total) by mouth 2  (two) times daily.     furosemide 20 MG tablet  Commonly known as:  LASIX  Take 1 tablet (20 mg total) by mouth daily.     glimepiride 2 MG tablet  Commonly known as:  AMARYL  Take 1 tablet (2 mg total) by mouth daily with breakfast.     glucose blood test strip  Commonly known as:  RELION GLUCOSE TEST STRIPS  Check no more than twice daily.     RELION ULTRA THIN PLUS LANCETS Misc  Check no more than twice daily     simvastatin 40 MG tablet  Commonly known as:  ZOCOR  Take 1 tablet (40 mg total) by mouth at bedtime.     sitaGLIPtin 50 MG tablet  Commonly known as:  JANUVIA  Take 1 tablet (50 mg total) by mouth daily.     trolamine salicylate 10 % cream  Commonly known as:  ASPERCREME  Apply topically as needed.     vitamin B-12 100 MCG  tablet  Commonly known as:  CYANOCOBALAMIN  Take 100 mcg by mouth daily.           Objective:   Physical Exam BP 118/66 mmHg  Pulse 75  Temp(Src) 97.5 F (36.4 C) (Oral)  Ht  (1.549 m)  Wt 138 lb 4 oz (62.71 kg)  BMI 26.14 kg/m2  SpO2 98%  General:   Well developed, well nourished . NAD.  HEENT:  Normocephalic . Face symmetric, atraumatic Lungs:  CTA B Normal respiratory effort, no intercostal retractions, no accessory muscle use. Heart: RRR,  no murmur.  no pretibial edema bilaterally  Neurologic:  alert & oriented X3.  Speech normal, gait appropriate for age and unassisted Psych--  Cognition and judgment appear intact.  Cooperative with normal attention span and concentration.  Behavior appropriate. No anxious or depressed appearing.       Assessment & Plan:

## 2014-10-20 DIAGNOSIS — H903 Sensorineural hearing loss, bilateral: Secondary | ICD-10-CM | POA: Diagnosis not present

## 2014-10-20 DIAGNOSIS — Z9621 Cochlear implant status: Secondary | ICD-10-CM | POA: Diagnosis not present

## 2014-10-24 ENCOUNTER — Other Ambulatory Visit: Payer: Self-pay | Admitting: Internal Medicine

## 2014-12-16 ENCOUNTER — Other Ambulatory Visit: Payer: Self-pay | Admitting: Internal Medicine

## 2015-01-22 ENCOUNTER — Ambulatory Visit (INDEPENDENT_AMBULATORY_CARE_PROVIDER_SITE_OTHER): Payer: Medicare Other | Admitting: Internal Medicine

## 2015-01-22 ENCOUNTER — Encounter: Payer: Self-pay | Admitting: Internal Medicine

## 2015-01-22 VITALS — BP 122/60 | HR 85 | Temp 98.2°F | Ht 61.0 in | Wt 135.5 lb

## 2015-01-22 DIAGNOSIS — N289 Disorder of kidney and ureter, unspecified: Secondary | ICD-10-CM | POA: Diagnosis not present

## 2015-01-22 DIAGNOSIS — E785 Hyperlipidemia, unspecified: Secondary | ICD-10-CM

## 2015-01-22 DIAGNOSIS — Z09 Encounter for follow-up examination after completed treatment for conditions other than malignant neoplasm: Secondary | ICD-10-CM | POA: Insufficient documentation

## 2015-01-22 DIAGNOSIS — D649 Anemia, unspecified: Secondary | ICD-10-CM | POA: Diagnosis not present

## 2015-01-22 DIAGNOSIS — E119 Type 2 diabetes mellitus without complications: Secondary | ICD-10-CM

## 2015-01-22 LAB — CBC WITH DIFFERENTIAL/PLATELET
BASOS ABS: 0.1 10*3/uL (ref 0.0–0.1)
Basophils Relative: 1.4 % (ref 0.0–3.0)
EOS ABS: 0.4 10*3/uL (ref 0.0–0.7)
Eosinophils Relative: 4.4 % (ref 0.0–5.0)
HCT: 32 % — ABNORMAL LOW (ref 36.0–46.0)
Hemoglobin: 10.3 g/dL — ABNORMAL LOW (ref 12.0–15.0)
LYMPHS PCT: 21.1 % (ref 12.0–46.0)
Lymphs Abs: 1.7 10*3/uL (ref 0.7–4.0)
MCHC: 32.2 g/dL (ref 30.0–36.0)
MCV: 82.6 fl (ref 78.0–100.0)
MONOS PCT: 9.4 % (ref 3.0–12.0)
Monocytes Absolute: 0.8 10*3/uL (ref 0.1–1.0)
NEUTROS ABS: 5.2 10*3/uL (ref 1.4–7.7)
Neutrophils Relative %: 63.7 % (ref 43.0–77.0)
PLATELETS: 412 10*3/uL — AB (ref 150.0–400.0)
RBC: 3.87 Mil/uL (ref 3.87–5.11)
RDW: 13.7 % (ref 11.5–15.5)
WBC: 8.1 10*3/uL (ref 4.0–10.5)

## 2015-01-22 LAB — BASIC METABOLIC PANEL
BUN: 20 mg/dL (ref 6–23)
CHLORIDE: 102 meq/L (ref 96–112)
CO2: 29 mEq/L (ref 19–32)
Calcium: 9.9 mg/dL (ref 8.4–10.5)
Creatinine, Ser: 1.43 mg/dL — ABNORMAL HIGH (ref 0.40–1.20)
GFR: 44.39 mL/min — ABNORMAL LOW (ref >60.00)
Glucose, Bld: 84 mg/dL (ref 70–99)
POTASSIUM: 4.9 meq/L (ref 3.5–5.1)
SODIUM: 138 meq/L (ref 135–145)

## 2015-01-22 LAB — AST: AST: 16 U/L (ref 0–37)

## 2015-01-22 LAB — ALT: ALT: 12 U/L (ref 0–35)

## 2015-01-22 LAB — HEMOGLOBIN A1C: Hgb A1c MFr Bld: 8.1 % — ABNORMAL HIGH (ref 4.6–6.5)

## 2015-01-22 NOTE — Progress Notes (Signed)
Subjective:    Patient ID: Jeanne Barker, female    DOB: 01/22/1924, 81 y.o.   MRN: [id number]  DOS:  01/22/2015 Type of visit - description : Routine visit, here with [name] Interval history: Feeling great, no concerns. DM: Good compliance of medication, no ambulatory CBGs, no symptoms consistent with low blood sugar. High cholesterol: Good compliance of medication, no apparent side effects HTN: Good compliance of medication, BP today is normal   Review of Systems Appetite is great. Denies chest pain or difficulty breathing No nausea, vomiting, diarrhea.   Past Medical History  Diagnosis Date  . Anemia   . Diabetes mellitus   . Hyperlipidemia   . Chronic renal insufficiency   . Hard of hearing   . Syphilis     ?latent?, treated c/ PCN 1993  . Osteoarthritis   . Hypertension     Past Surgical History  Procedure Laterality Date  . Cochlear impalnt  1/02  . Abdominal hysterectomy      BSO  . Nephrectomy      left  . Oophorectomy    . Cataract extraction      Social History   Social History  . Marital Status: Single    Spouse Name: N/A  . Number of Children: 5  . Years of Education: N/A   Occupational History  . PT job   .     Social History Main Topics  . Smoking status: Former Games developermoker  . Smokeless tobacco: Never Used  . Alcohol Use: No  . Drug Use: Not on file  . Sexual Activity: No   Other Topics Concern  . Not on file   Social History Narrative   Son lives with her, still drives to work.   Her daughter [name] [phone]  lives 20 minutes away and usually comes w/ the pt to the office visit        Medication List       This list is accurate as of: 01/22/15  6:59 PM.  Always use your most recent med list.               acetaminophen 500 MG tablet  Commonly known as:  TYLENOL  Take 500 mg by mouth every 6 (six) hours as needed.     aspirin 81 MG tablet  Take 81 mg by mouth daily.     cloNIDine 0.1 MG tablet  Commonly known as:   CATAPRES  Take 1 tablet (0.1 mg total) by mouth 2 (two) times daily.     furosemide 20 MG tablet  Commonly known as:  LASIX  Take 1 tablet (20 mg total) by mouth daily.     glimepiride 2 MG tablet  Commonly known as:  AMARYL  Take 1 tablet (2 mg total) by mouth daily with breakfast.     glucose blood test strip  Commonly known as:  RELION GLUCOSE TEST STRIPS  Check no more than twice daily.     RELION ULTRA THIN PLUS LANCETS Misc  Check no more than twice daily     simvastatin 40 MG tablet  Commonly known as:  ZOCOR  Take 1 tablet (40 mg total) by mouth at bedtime.     sitaGLIPtin 50 MG tablet  Commonly known as:  JANUVIA  Take 1 tablet (50 mg total) by mouth daily.     trolamine salicylate 10 % cream  Commonly known as:  ASPERCREME  Apply topically as needed.     vitamin B-12 100  MCG tablet  Commonly known as:  CYANOCOBALAMIN  Take 100 mcg by mouth daily.           Objective:   Physical Exam BP 122/60 mmHg  Pulse 85  Temp(Src) 98.2 F (36.8 C) (Oral)  Ht  (1.549 m)  Wt 135 lb 8 oz (61.462 kg)  BMI 25.62 kg/m2  SpO2 95% General:   Well developed, well nourished . NAD.  HEENT:  Normocephalic . Face symmetric, atraumatic Lungs:  CTA B Normal respiratory effort, no intercostal retractions, no accessory muscle use. Heart: RRR,  no murmur.  No pretibial edema bilaterally  Diabetic foot exam: No edema, present  pedal pulses, pinprick examination normal. No skin lesions. Neurologic:  alert & oriented X3.  Speech normal, gait appropriate for age and unassisted Psych--  Cooperative with normal attention span and concentration.  Behavior appropriate. No anxious or depressed appearing.      Assessment & Plan:   Assessment > DM, no neuropathy per foot exam 01-2015 HTN Hyperlipidemia Chronic renal insufficiency, declined to see nephrology again Anemia DJD Latent syphilis? S/p Penicillin 1993  Plan  DM: Check A1c, she seems to be doing well, does  not like to check CBGs. I still recommend to check CBGs if she has low cbg sx. Feet exam negative today Renal failure: Check a BMP Hyperlipidemia: Well-controlled per last FLP, check liver tests Anemia: Check a CBC. Primary care: Declined the pneumonia, flu   and tetanus shot RTC 6 months

## 2015-01-22 NOTE — Assessment & Plan Note (Signed)
DM: Check A1c, she seems to be doing well, does not like to check CBGs. I still recommend to check CBGs if she has low cbg sx. Feet exam negative today Renal failure: Check a BMP Hyperlipidemia: Well-controlled per last FLP, check liver tests Anemia: Check a CBC. Primary care: Declined the pneumonia, flu   and tetanus shot RTC 6 months

## 2015-01-22 NOTE — Progress Notes (Signed)
Pre visit review using our clinic review tool, if applicable. No additional management support is needed unless otherwise documented below in the visit note. 

## 2015-01-22 NOTE — Patient Instructions (Signed)
Get your blood work before you leave       Next visit  for a  routine checkup in 6 months (30 minutes), fasting  Please schedule an appointment at the front desk

## 2015-02-17 ENCOUNTER — Other Ambulatory Visit: Payer: Self-pay | Admitting: Internal Medicine

## 2015-03-27 ENCOUNTER — Other Ambulatory Visit: Payer: Self-pay | Admitting: Internal Medicine

## 2015-04-06 ENCOUNTER — Other Ambulatory Visit: Payer: Self-pay | Admitting: Internal Medicine

## 2015-04-21 ENCOUNTER — Other Ambulatory Visit: Payer: Self-pay | Admitting: Internal Medicine

## 2015-06-12 ENCOUNTER — Other Ambulatory Visit: Payer: Self-pay | Admitting: Internal Medicine

## 2015-07-23 ENCOUNTER — Encounter: Payer: Self-pay | Admitting: Internal Medicine

## 2015-07-23 ENCOUNTER — Ambulatory Visit (INDEPENDENT_AMBULATORY_CARE_PROVIDER_SITE_OTHER): Payer: Medicare Other | Admitting: Internal Medicine

## 2015-07-23 VITALS — BP 118/76 | HR 81 | Temp 98.8°F | Ht 61.0 in | Wt 138.1 lb

## 2015-07-23 DIAGNOSIS — Z09 Encounter for follow-up examination after completed treatment for conditions other than malignant neoplasm: Secondary | ICD-10-CM

## 2015-07-23 DIAGNOSIS — E1121 Type 2 diabetes mellitus with diabetic nephropathy: Secondary | ICD-10-CM | POA: Diagnosis not present

## 2015-07-23 DIAGNOSIS — I10 Essential (primary) hypertension: Secondary | ICD-10-CM | POA: Diagnosis not present

## 2015-07-23 DIAGNOSIS — N183 Chronic kidney disease, stage 3 unspecified: Secondary | ICD-10-CM

## 2015-07-23 DIAGNOSIS — E785 Hyperlipidemia, unspecified: Secondary | ICD-10-CM

## 2015-07-23 LAB — LIPID PANEL
CHOL/HDL RATIO: 2
Cholesterol: 211 mg/dL — ABNORMAL HIGH (ref 0–200)
HDL: 85.2 mg/dL (ref >39.00)
LDL Cholesterol: 107 mg/dL — ABNORMAL HIGH (ref 0–99)
NonHDL: 126.04
TRIGLYCERIDES: 95 mg/dL (ref 0.0–149.0)
VLDL: 19 mg/dL (ref 0.0–40.0)

## 2015-07-23 LAB — CBC WITH DIFFERENTIAL/PLATELET
BASOS PCT: 0.5 % (ref 0.0–3.0)
Basophils Absolute: 0 10*3/uL (ref 0.0–0.1)
EOS ABS: 0.1 10*3/uL (ref 0.0–0.7)
Eosinophils Relative: 1.5 % (ref 0.0–5.0)
HEMATOCRIT: 33.4 % — AB (ref 36.0–46.0)
Hemoglobin: 11 g/dL — ABNORMAL LOW (ref 12.0–15.0)
Lymphocytes Relative: 34.5 % (ref 12.0–46.0)
Lymphs Abs: 2.3 10*3/uL (ref 0.7–4.0)
MCHC: 32.9 g/dL (ref 30.0–36.0)
MCV: 81.6 fl (ref 78.0–100.0)
MONOS PCT: 9.6 % (ref 3.0–12.0)
Monocytes Absolute: 0.6 10*3/uL (ref 0.1–1.0)
NEUTROS ABS: 3.5 10*3/uL (ref 1.4–7.7)
Neutrophils Relative %: 53.9 % (ref 43.0–77.0)
PLATELETS: 341 10*3/uL (ref 150.0–400.0)
RBC: 4.09 Mil/uL (ref 3.87–5.11)
RDW: 13.9 % (ref 11.5–15.5)
WBC: 6.5 10*3/uL (ref 4.0–10.5)

## 2015-07-23 LAB — BASIC METABOLIC PANEL
BUN: 29 mg/dL — ABNORMAL HIGH (ref 6–23)
CO2: 29 mEq/L (ref 19–32)
Calcium: 10.6 mg/dL — ABNORMAL HIGH (ref 8.4–10.5)
Chloride: 102 mEq/L (ref 96–112)
Creatinine, Ser: 1.72 mg/dL — ABNORMAL HIGH (ref 0.40–1.20)
GFR: 35.83 mL/min — ABNORMAL LOW (ref >60.00)
Glucose, Bld: 96 mg/dL (ref 70–99)
Potassium: 5 mEq/L (ref 3.5–5.1)
Sodium: 137 mEq/L (ref 135–145)

## 2015-07-23 LAB — MICROALBUMIN / CREATININE URINE RATIO
Creatinine,U: 73 mg/dL
Microalb Creat Ratio: 16.2 mg/g (ref 0.0–30.0)
Microalb, Ur: 11.8 mg/dL — ABNORMAL HIGH (ref 0.0–1.9)

## 2015-07-23 LAB — HEMOGLOBIN A1C: Hgb A1c MFr Bld: 8.5 % — ABNORMAL HIGH (ref 4.6–6.5)

## 2015-07-23 NOTE — Progress Notes (Signed)
Subjective:    Patient ID: Jeanne Barker, female    DOB: 04/09/1924, 82 y.o.   MRN: [id number]  DOS:  07/23/2015 Type of visit - description : Routine visit, here with her daughter Interval history: In general feeling great, has no concerns. Good compliance with medications, no recent ambulatory BPs or CBGs. No recent falls. Still working full-time, very happy. The daughter reports that her memory is excellent.    Review of Systems   Past Medical History  Diagnosis Date  . Anemia   . Diabetes mellitus   . Hyperlipidemia   . Chronic renal insufficiency   . Hard of hearing   . Syphilis     ?latent?, treated c/ PCN 1993  . Osteoarthritis   . Hypertension     Past Surgical History  Procedure Laterality Date  . Cochlear impalnt  1/02  . Abdominal hysterectomy      BSO  . Nephrectomy      left  . Oophorectomy    . Cataract extraction      Social History   Social History  . Marital Status: Single    Spouse Name: N/A  . Number of Children: 5  . Years of Education: N/A   Occupational History  . PT job   .     Social History Main Topics  . Smoking status: Former Games developermoker  . Smokeless tobacco: Never Used  . Alcohol Use: No  . Drug Use: Not on file  . Sexual Activity: No   Other Topics Concern  . Not on file   Social History Narrative   Son lives with her, still drives to work.   Her daughter Jeanne Barker [phone])  lives 20 minutes away and usually comes w/ the pt to the office visit        Medication List       This list is accurate as of: 07/23/15 11:59 PM.  Always use your most recent med list.               acetaminophen 500 MG tablet  Commonly known as:  TYLENOL  Take 500 mg by mouth every 6 (six) hours as needed.     aspirin 81 MG tablet  Take 81 mg by mouth daily.     cloNIDine 0.1 MG tablet  Commonly known as:  CATAPRES  Take 1 tablet (0.1 mg total) by mouth 2 (two) times daily.     furosemide 20 MG tablet  Commonly known as:  LASIX   Take 1 tablet (20 mg total) by mouth daily.     glimepiride 2 MG tablet  Commonly known as:  AMARYL  Take 1 tablet (2 mg total) by mouth daily with breakfast.     glucose blood test strip  Commonly known as:  RELION GLUCOSE TEST STRIPS  Check no more than twice daily.     RELION ULTRA THIN PLUS LANCETS Misc  Check no more than twice daily     simvastatin 40 MG tablet  Commonly known as:  ZOCOR  Take 1 tablet (40 mg total) by mouth at bedtime.     sitaGLIPtin 50 MG tablet  Commonly known as:  JANUVIA  Take 1 tablet (50 mg total) by mouth daily.     trolamine salicylate 10 % cream  Commonly known as:  ASPERCREME  Apply topically as needed.     vitamin B-12 100 MCG tablet  Commonly known as:  CYANOCOBALAMIN  Take 100 mcg by mouth daily.  Objective:   Physical Exam BP 118/76 mmHg  Pulse 81  Temp(Src) 98.8 F (37.1 C) (Oral)  Ht  (1.549 m)  Wt 138 lb 2 oz (62.653 kg)  BMI 26.11 kg/m2  SpO2 99% General:   Well developed, well nourished . NAD.  HEENT:  Normocephalic . Face symmetric, atraumatic Lungs:  CTA B Normal respiratory effort, no intercostal retractions, no accessory muscle use. Heart: RRR,  no murmur.  No pretibial edema bilaterally  Skin: Not pale. Not jaundice Neurologic:  alert & oriented X3.  Speech normal, gait appropriate for age and unassisted Psych--  Cognition and judgment appear intact.  Cooperative with normal attention span and concentration.  Behavior appropriate. No anxious or depressed appearing.      Assessment & Plan:   Assessment > DM, no neuropathy per foot exam 01-2015 HTN Hyperlipidemia Chronic renal insufficiency, declined to see nephrology again Anemia DJD Latent syphilis? S/p Penicillin 1993   Plan: DM: Continue Januvia, glimepiride, check A1c, microalbumin. Renal failure: Check a BMP and CBC Hyperlipidemia: Recent liver tests normal, check a FLP, continue simvastatin Primary care: Declined a bone  density test. RTC 6 months

## 2015-07-23 NOTE — Patient Instructions (Signed)
GO TO THE LAB :      Get the blood work     GO TO THE FRONT DESK Schedule your next appointment fo

## 2015-07-23 NOTE — Progress Notes (Signed)
Pre visit review using our clinic review tool, if applicable. No additional management support is needed unless otherwise documented below in the visit note. 

## 2015-07-24 ENCOUNTER — Ambulatory Visit: Payer: Medicare Other | Admitting: Internal Medicine

## 2015-07-26 NOTE — Assessment & Plan Note (Signed)
DM: Continue Januvia, glimepiride, check A1c, microalbumin. Renal failure: Check a BMP and CBC Hyperlipidemia: Recent liver tests normal, check a FLP, continue simvastatin Primary care: Declined a bone density test. RTC 6 months

## 2015-09-12 ENCOUNTER — Other Ambulatory Visit: Payer: Self-pay | Admitting: Internal Medicine

## 2015-09-22 ENCOUNTER — Telehealth: Payer: Self-pay

## 2015-09-22 DIAGNOSIS — Z4889 Encounter for other specified surgical aftercare: Secondary | ICD-10-CM

## 2015-09-22 DIAGNOSIS — Z9621 Cochlear implant status: Principal | ICD-10-CM

## 2015-09-22 NOTE — Telephone Encounter (Signed)
Received letter from [facility] Woods, Pt's daughter. Pt in need for Audiology referral [facility] her cochlear implant. Telephone number: [phone]. Letter sent for scanning to chart. Referral placed.

## 2015-09-28 ENCOUNTER — Telehealth: Payer: Self-pay | Admitting: Internal Medicine

## 2015-09-28 NOTE — Telephone Encounter (Signed)
Pt's daughter dropped off handicap renewal for pt, document placed in Jessica's tray at front office, daughter states she will pick up when ready

## 2015-09-28 NOTE — Telephone Encounter (Signed)
Form received and forwarded to PCP.   

## 2015-09-29 NOTE — Telephone Encounter (Signed)
Received completed form from Dr. Drue NovelPaz, copy made and sent for scanning. Tried calling Pt's daughter, Richarda OsmondMollie at number given [phone] to inform her that form has been completed at placed at front desk for pick up. (Per Dr. Drue NovelPaz, no charge for form completion).

## 2015-10-06 ENCOUNTER — Other Ambulatory Visit: Payer: Self-pay | Admitting: Internal Medicine

## 2015-10-23 ENCOUNTER — Other Ambulatory Visit: Payer: Self-pay | Admitting: Internal Medicine

## 2015-10-26 DIAGNOSIS — H903 Sensorineural hearing loss, bilateral: Secondary | ICD-10-CM | POA: Diagnosis not present

## 2015-10-26 DIAGNOSIS — Z45321 Encounter for adjustment and management of cochlear device: Secondary | ICD-10-CM | POA: Diagnosis not present

## 2015-11-07 ENCOUNTER — Encounter: Payer: Self-pay | Admitting: Family Medicine

## 2015-11-07 ENCOUNTER — Ambulatory Visit (INDEPENDENT_AMBULATORY_CARE_PROVIDER_SITE_OTHER): Payer: Medicare Other | Admitting: Family Medicine

## 2015-11-07 VITALS — BP 138/80 | HR 84 | Temp 98.0°F | Resp 16 | Ht 61.0 in | Wt 138.0 lb

## 2015-11-07 DIAGNOSIS — L0231 Cutaneous abscess of buttock: Secondary | ICD-10-CM

## 2015-11-07 DIAGNOSIS — L0291 Cutaneous abscess, unspecified: Secondary | ICD-10-CM | POA: Diagnosis not present

## 2015-11-07 DIAGNOSIS — L0390 Cellulitis, unspecified: Secondary | ICD-10-CM

## 2015-11-07 MED ORDER — SULFAMETHOXAZOLE-TRIMETHOPRIM 800-160 MG PO TABS: 1.0000 | ORAL_TABLET | Freq: Every day | ORAL | 0 refills | Status: DC

## 2015-11-07 NOTE — Patient Instructions (Addendum)
Change the wound packing gauze once daily and cover with a piece of gauze. Avoid getting the area wet until you are seen for follow up visit by your primary care doctor.  Take 1-2 tylenol tabs every 6 hours for pain.

## 2015-11-07 NOTE — Progress Notes (Signed)
OFFICE VISIT  11/07/2015   CC:  Chief Complaint  Patient presents with  . Rectal Pain    possible abcess on lower part of buttocks      HPI:    Patient is a 82 y.o.  female who presents for "possible abscess". Pain in R gluteal region x 2 day, worsening pain and feels an enlarging nodule there.  She can't sit on L glut region due to pain. No fevers.  No malaise.  Has no past history of boils.  Past Medical History:  Diagnosis Date  . Anemia   . Chronic renal insufficiency   . Diabetes mellitus   . Hard of hearing   . Hyperlipidemia   . Hypertension   . Osteoarthritis   . Syphilis    ?latent?, treated c/ PCN 1993    Past Surgical History:  Procedure Laterality Date  . ABDOMINAL HYSTERECTOMY     BSO  . CATARACT EXTRACTION    . cochlear impalnt  1/02  . NEPHRECTOMY     left  . OOPHORECTOMY      Outpatient Medications Prior to Visit  Medication Sig Dispense Refill  . acetaminophen (TYLENOL) 500 MG tablet Take 500 mg by mouth every 6 (six) hours as needed.      Marland Kitchen aspirin 81 MG tablet Take 81 mg by mouth daily.      . cloNIDine (CATAPRES) 0.1 MG tablet Take 1 tablet (0.1 mg total) by mouth 2 (two) times daily. 180 tablet 2  . furosemide (LASIX) 20 MG tablet Take 1 tablet (20 mg total) by mouth daily. 30 tablet 8  . glimepiride (AMARYL) 2 MG tablet Take 1 tablet (2 mg total) by mouth daily with breakfast. 90 tablet 1  . glucose blood (RELION GLUCOSE TEST STRIPS) test strip Check no more than twice daily. (Patient not taking: Reported on 09/22/2014) 100 each 12  . RELION ULTRA THIN PLUS LANCETS MISC Check no more than twice daily (Patient not taking: Reported on 09/22/2014) 100 each 12  . simvastatin (ZOCOR) 40 MG tablet Take 1 tablet (40 mg total) by mouth at bedtime. 90 tablet 1  . sitaGLIPtin (JANUVIA) 50 MG tablet Take 1 tablet (50 mg total) by mouth daily. 90 tablet 2  . trolamine salicylate (ASPERCREME) 10 % cream Apply topically as needed. (Patient not taking:  Reported on 09/22/2014) 85 g 0  . vitamin B-12 (CYANOCOBALAMIN) 100 MCG tablet Take 100 mcg by mouth daily.      No facility-administered medications prior to visit.     Allergies  Allergen Reactions  . Amlodipine Besylate Nausea Only    REACTION: nausea  . Lisinopril Other (See Comments)    REACTION: hyperkalemia    ROS As per HPI  PE: Blood pressure 138/80, pulse 84, temperature 98 F (36.7 C), temperature source Oral, resp. rate 16, height 5\' 1"  (1.549 m), weight 138 lb (62.6 kg), SpO2 98 %. Gen: alert, NAD.  Her daughter was here with her. Right glut with golf ball sized subQ nodule, firm and fluctuant and very tender, warm and with some skin erythema in the region.  REctal: no pain   LABS:  none  IMPRESSION AND PLAN:  Left gluteal abscess with small amount of cellulitis at the site as well. Started bactrim DS 1 tab po qd (renal dosing)x 7d. Performed I & D in office today.  Procedure: Incision and drainage of right gluteal abscess.  The indication for the procedure was explained to the patient, benefits and risks of procedure  were outlined for patient, patient agreed to proceed.  Steps of the procedure were clearly explained to the patient prior to starting. Injected lesion with 5 ml of 1% lidocaine with epi for local anesthesia.  Incised central portion of lesion with scalpel and used manual pressure and hemostats to express contents and encourage complete drainage.  Culture swab of lesion contents obtained and sent to lab.  Inserted iodoform packing gauze into wound cavity. Wound dressed.  No bleeding.  Patient tolerated procedure well.  No immediate complications.  Pt felt better afterwards.  Wound care instructions given.  Warning signs of infection discussed. Follow up discussed.  Call or return for problems.  An After Visit Summary was printed and given to the patient.  FOLLOW UP: Return in about 5 days (around 11/12/2015) for f/u gluteal abscess: s/p I&D.  Signed:   Santiago Bumpers, MD           11/07/2015

## 2015-11-08 ENCOUNTER — Other Ambulatory Visit (HOSPITAL_COMMUNITY)
Admission: RE | Admit: 2015-11-08 | Discharge: 2015-11-08 | Disposition: A | Discharge status: Home / Self Care | Payer: Medicare Other | Source: Ambulatory Visit | Attending: Family Medicine | Admitting: Family Medicine

## 2015-11-08 DIAGNOSIS — L0390 Cellulitis, unspecified: Secondary | ICD-10-CM | POA: Insufficient documentation

## 2015-11-08 DIAGNOSIS — L0231 Cutaneous abscess of buttock: Secondary | ICD-10-CM | POA: Insufficient documentation

## 2015-11-09 ENCOUNTER — Telehealth: Payer: Self-pay | Admitting: Family Medicine

## 2015-11-09 NOTE — Telephone Encounter (Signed)
Jeanne Barker from [facility] lab called and stated that the wound culture they received Saturday was not labeled and they are unable to perform test.  Also, even if it was labeled they can only use ESwabs for anaerobic cultures and use red top swabs for aerobic cultures.

## 2015-11-09 NOTE — Telephone Encounter (Signed)
Noted. Sorry, Dr. Drue Novel. I sent the wrong swab.  I couldn't find the red top swabs at [facility].   --Phil.

## 2015-11-09 NOTE — Telephone Encounter (Signed)
S/p I&D w/ packing of an abscess, unable to send a culture. Please call the patient: See how she's doing, make sure she has a follow-up here  for tomorrow or the next day for a checkup.

## 2015-11-09 NOTE — Telephone Encounter (Signed)
Spoke w/ Mollie, Pt's daughter, she spoke w/ Pt earlier this morning and Pt is feeling much better and not having much pain w/ I&D site. Mollie has scheduled appt for Pt to be seen for 11/12/2015 at 1130 and will call if Pt has worsening pain, fever, chills.

## 2015-11-12 ENCOUNTER — Encounter: Payer: Self-pay | Admitting: Internal Medicine

## 2015-11-12 ENCOUNTER — Ambulatory Visit (INDEPENDENT_AMBULATORY_CARE_PROVIDER_SITE_OTHER): Payer: Medicare Other | Admitting: Internal Medicine

## 2015-11-12 VITALS — BP 120/76 | HR 56 | Temp 98.0°F | Resp 14 | Ht 61.0 in | Wt 139.5 lb

## 2015-11-12 DIAGNOSIS — L0291 Cutaneous abscess, unspecified: Secondary | ICD-10-CM | POA: Diagnosis not present

## 2015-11-12 DIAGNOSIS — L0390 Cellulitis, unspecified: Secondary | ICD-10-CM

## 2015-11-12 NOTE — Progress Notes (Signed)
Subjective:    Patient ID: Jeanne Barker, female    DOB: October 13, 1923, 82 y.o.   MRN: [id number]  DOS:  11/12/2015 Type of visit - description :  Follow-up Interval history: Had a buttock abscess drained and packed few days ago, here for a checkup, good antibiotic compliance, no apparent side effects.   Review of Systems  Reports no pain at this time. No fever or chills No nausea, vomiting, diarrhea.  Past Medical History:  Diagnosis Date  . Anemia   . Chronic renal insufficiency   . Diabetes mellitus   . Hard of hearing   . Hyperlipidemia   . Hypertension   . Osteoarthritis   . Syphilis    ?latent?, treated c/ PCN 1993    Past Surgical History:  Procedure Laterality Date  . ABDOMINAL HYSTERECTOMY     BSO  . CATARACT EXTRACTION    . cochlear impalnt  1/02  . NEPHRECTOMY     left  . OOPHORECTOMY      Social History   Social History  . Marital status: Single    Spouse name: N/A  . Number of children: 5  . Years of education: N/A   Occupational History  . PT job   .  Retired   Social History Main Topics  . Smoking status: Former Games developer  . Smokeless tobacco: Never Used  . Alcohol use No  . Drug use: Unknown  . Sexual activity: No   Other Topics Concern  . Not on file   Social History Narrative   Son lives with her, still drives to work.   Her daughter Kirt Boys [phone])  lives 20 minutes away and usually comes w/ the pt to the office visit        Medication List       Accurate as of 11/12/15  5:19 PM. Always use your most recent med list.          acetaminophen 500 MG tablet Commonly known as:  TYLENOL Take 500 mg by mouth every 6 (six) hours as needed.   aspirin 81 MG tablet Take 81 mg by mouth daily.   cloNIDine 0.1 MG tablet Commonly known as:  CATAPRES Take 1 tablet (0.1 mg total) by mouth 2 (two) times daily.   furosemide 20 MG tablet Commonly known as:  LASIX Take 1 tablet (20 mg total) by mouth daily.   glimepiride 2 MG  tablet Commonly known as:  AMARYL Take 1 tablet (2 mg total) by mouth daily with breakfast.   glucose blood test strip Commonly known as:  RELION GLUCOSE TEST STRIPS Check no more than twice daily.   RELION ULTRA THIN PLUS LANCETS Misc Check no more than twice daily   simvastatin 40 MG tablet Commonly known as:  ZOCOR Take 1 tablet (40 mg total) by mouth at bedtime.   sitaGLIPtin 50 MG tablet Commonly known as:  JANUVIA Take 1 tablet (50 mg total) by mouth daily.   sulfamethoxazole-trimethoprim 800-160 MG tablet Commonly known as:  BACTRIM DS,SEPTRA DS Take 1 tablet by mouth daily.   trolamine salicylate 10 % cream Commonly known as:  ASPERCREME Apply topically as needed.   vitamin B-12 100 MCG tablet Commonly known as:  CYANOCOBALAMIN Take 100 mcg by mouth daily.          Objective:   Physical Exam  Genitourinary:      BP 120/76 (BP Location: Left Arm, Patient Position: Sitting, Cuff Size: Small)   Pulse (!) 56  Temp 98 F (36.7 C) (Oral)   Resp 14   Ht 5\' 1"  (1.549 m)   Wt 139 lb 8 oz (63.3 kg)   SpO2 98%   BMI 26.36 kg/m     General:   Well developed, well nourished . NAD.  HEENT:  Normocephalic . Face symmetric, atraumatic  Neurologic:  alert & oriented X3.  Speech normal, gait appropriate for age and unassisted Psych--  Cognition and judgment appear intact.  Cooperative with normal attention span and concentration.  Behavior appropriate. No anxious or depressed appearing.     Assessment & Plan:   Assessment > DM, no neuropathy per foot exam 01-2015 HTN Hyperlipidemia Chronic renal insufficiency, declined to see nephrology again Anemia DJD Latent syphilis? S/p Penicillin 1993  Plan: Buttock  abscess:   Seems to be doing well, to finish antibiotics tomorrow, discussed with the patient's daughter the need to be extremely careful about hygiene, best would be soap and water after a bowel movement, alternatively baby wipes. See AVS, RTC  2 weeks

## 2015-11-12 NOTE — Assessment & Plan Note (Signed)
Buttock  abscess:   Seems to be doing well, to finish antibiotics tomorrow, discussed with the patient's daughter the need to be extremely careful about hygiene, best would be soap and water after a bowel movement, alternatively baby wipes. See AVS, RTC 2 weeks

## 2015-11-12 NOTE — Progress Notes (Signed)
Pre visit review using our clinic review tool, if applicable. No additional management support is needed unless otherwise documented below in the visit note. 

## 2015-11-12 NOTE — Patient Instructions (Signed)
Finish the antibiotics  Keep the area clean and dry particularly after a bowel movement. Soap and water are excellent  If she has pain, fever, the area looks red and swollen please let us know.  Next visit in 2 weeks for a checkup

## 2015-11-15 ENCOUNTER — Other Ambulatory Visit: Payer: Self-pay | Admitting: Internal Medicine

## 2015-11-17 ENCOUNTER — Ambulatory Visit: Payer: Medicare Other | Admitting: Internal Medicine

## 2015-11-26 ENCOUNTER — Ambulatory Visit (INDEPENDENT_AMBULATORY_CARE_PROVIDER_SITE_OTHER): Payer: Medicare Other | Admitting: Internal Medicine

## 2015-11-26 ENCOUNTER — Encounter: Payer: Self-pay | Admitting: Internal Medicine

## 2015-11-26 VITALS — BP 122/78 | HR 69 | Temp 98.2°F | Resp 14 | Ht 61.0 in | Wt 139.4 lb

## 2015-11-26 DIAGNOSIS — L0291 Cutaneous abscess, unspecified: Secondary | ICD-10-CM | POA: Diagnosis not present

## 2015-11-26 DIAGNOSIS — L0390 Cellulitis, unspecified: Secondary | ICD-10-CM

## 2015-11-26 NOTE — Progress Notes (Signed)
Pre visit review using our clinic review tool, if applicable. No additional management support is needed unless otherwise documented below in the visit note. 

## 2015-11-26 NOTE — Patient Instructions (Signed)
  GO TO THE FRONT DESK Schedule your next appointment for a  Check up in 3 months  

## 2015-11-26 NOTE — Progress Notes (Signed)
Subjective:    Patient ID: Jeanne Barker, female    DOB: 07/18/1923, 82 y.o.   MRN: [id number]  DOS:  11/26/2015 Type of visit - description : Follow-up Interval history: Was treated for an abscess and cellulitis with Bactrim, took antibiotics, feeling much better.   Review of Systems   no fever chills No nausea, vomiting, diarrhea  Past Medical History:  Diagnosis Date  . Anemia   . Chronic renal insufficiency   . Diabetes mellitus   . Hard of hearing   . Hyperlipidemia   . Hypertension   . Osteoarthritis   . Syphilis    ?latent?, treated c/ PCN 1993    Past Surgical History:  Procedure Laterality Date  . ABDOMINAL HYSTERECTOMY     BSO  . CATARACT EXTRACTION    . cochlear impalnt  1/02  . NEPHRECTOMY     left  . OOPHORECTOMY      Social History   Social History  . Marital status: Single    Spouse name: N/A  . Number of children: 5  . Years of education: N/A   Occupational History  . PT job   .  Retired   Social History Main Topics  . Smoking status: Former Games developermoker  . Smokeless tobacco: Never Used  . Alcohol use No  . Drug use: Unknown  . Sexual activity: No   Other Topics Concern  . Not on file   Social History Narrative   Son lives with her, still drives to work.   Her daughter [name] [phone])  lives 20 minutes away and usually comes w/ the pt to the office visit        Medication List       Accurate as of 11/26/15 11:59 PM. Always use your most recent med list.          acetaminophen 500 MG tablet Commonly known as:  TYLENOL Take 500 mg by mouth every 6 (six) hours as needed.   aspirin 81 MG tablet Take 81 mg by mouth daily.   cloNIDine 0.1 MG tablet Commonly known as:  CATAPRES Take 1 tablet (0.1 mg total) by mouth 2 (two) times daily.   furosemide 20 MG tablet Commonly known as:  LASIX Take 1 tablet (20 mg total) by mouth daily.   glimepiride 2 MG tablet Commonly known as:  AMARYL Take 1 tablet (2 mg total) by mouth  daily with breakfast.   glucose blood test strip Commonly known as:  RELION GLUCOSE TEST STRIPS Check no more than twice daily.   RELION ULTRA THIN PLUS LANCETS Misc Check no more than twice daily   simvastatin 40 MG tablet Commonly known as:  ZOCOR Take 1 tablet (40 mg total) by mouth at bedtime.   sitaGLIPtin 50 MG tablet Commonly known as:  JANUVIA Take 1 tablet (50 mg total) by mouth daily.   trolamine salicylate 10 % cream Commonly known as:  ASPERCREME Apply topically as needed.   vitamin B-12 100 MCG tablet Commonly known as:  CYANOCOBALAMIN Take 100 mcg by mouth daily.          Objective:   Physical Exam BP 122/78 (BP Location: Left Arm, Patient Position: Sitting, Cuff Size: Small)   Pulse 69   Temp 98.2 F (36.8 C) (Oral)   Resp 14   Ht 5\' 1"  (1.549 m)   Wt 139 lb 6 oz (63.2 kg)   SpO2 95%   BMI 26.33 kg/m  General:   Well developed,  well nourished . NAD.  HEENT:  Normocephalic . Face symmetric, atraumatic Skin: Previously seen wound has decreased in size, 1x0.6 cm x only 3 mm deep. No cellulitis type of changes, no discharge or fluctuancy Neurologic:  alert & oriented X3.  Speech normal, gait appropriate for age and unassisted Psych--   No anxious or depressed appearing.      Assessment & Plan:   Assessment > DM, no neuropathy per foot exam 01-2015 HTN Hyperlipidemia Chronic renal insufficiency, declined to see nephrology again Anemia DJD Latent syphilis? S/p Penicillin 1993  PLAN: Buttock abscess: Resolving, status post antibiotics without apparent side effects. Recommend to keep the area uncovered and clean, call if problems. RTC 3 months ROV

## 2015-11-27 NOTE — Assessment & Plan Note (Signed)
Buttock abscess: Resolving, status post antibiotics without apparent side effects. Recommend to keep the area uncovered and clean, call if problems. RTC 3 months ROV

## 2015-12-26 ENCOUNTER — Other Ambulatory Visit: Payer: Self-pay | Admitting: Internal Medicine

## 2016-01-22 ENCOUNTER — Encounter: Payer: Self-pay | Admitting: Internal Medicine

## 2016-01-22 ENCOUNTER — Ambulatory Visit (HOSPITAL_BASED_OUTPATIENT_CLINIC_OR_DEPARTMENT_OTHER)
Admission: RE | Admit: 2016-01-22 | Discharge: 2016-01-22 | Disposition: A | Discharge status: Home / Self Care | Payer: Medicare Other | Source: Ambulatory Visit | Attending: Internal Medicine | Admitting: Internal Medicine

## 2016-01-22 ENCOUNTER — Ambulatory Visit (INDEPENDENT_AMBULATORY_CARE_PROVIDER_SITE_OTHER): Payer: Medicare Other | Admitting: Internal Medicine

## 2016-01-22 VITALS — BP 124/70 | HR 62 | Temp 98.0°F | Resp 12 | Ht 61.0 in | Wt 138.0 lb

## 2016-01-22 DIAGNOSIS — M79604 Pain in right leg: Secondary | ICD-10-CM | POA: Insufficient documentation

## 2016-01-22 DIAGNOSIS — M25551 Pain in right hip: Secondary | ICD-10-CM | POA: Diagnosis not present

## 2016-01-22 DIAGNOSIS — S3992XA Unspecified injury of lower back, initial encounter: Secondary | ICD-10-CM | POA: Diagnosis not present

## 2016-01-22 DIAGNOSIS — M545 Low back pain: Secondary | ICD-10-CM | POA: Diagnosis not present

## 2016-01-22 DIAGNOSIS — M5136 Other intervertebral disc degeneration, lumbar region: Secondary | ICD-10-CM | POA: Insufficient documentation

## 2016-01-22 MED ORDER — VALACYCLOVIR HCL 1 G PO TABS: 1000.0000 mg | ORAL_TABLET | Freq: Every day | ORAL | 0 refills | Status: DC

## 2016-01-22 NOTE — Patient Instructions (Addendum)
GO TO THE FRONT DESK Schedule your next appointment for a  checkup in 10 days      STOP BY THE FIRST FLOOR:  get the XR (hip and back)  Take Valtrex 1 tablet daily for 1 week  Tylenol  500 mg OTC 2 tabs a day every 8 hours as needed for pain   Call  if the pain is not well-controlled with Tylenol.   Call if fever, chills, increase symptoms.

## 2016-01-22 NOTE — Progress Notes (Signed)
Pre visit review using our clinic review tool, if applicable. No additional management support is needed unless otherwise documented below in the visit note. 

## 2016-01-22 NOTE — Progress Notes (Signed)
Subjective:    Patient ID: Jeanne Barker, female    DOB: 1923/08/05, 82 y.o.   MRN: [id number]  DOS:  01/22/2016 Type of visit - description : Acute visit, here with her daughter, chief complaint "shingles" Interval history:  Patient reports that developed pain from the right hip down to the ankle for the last few days, pain is steady, burning, worse with ambulation, reminiscent of shingles she had in that leg years ago. Start has developed a "rash" actually actually at both inner aspects of the legs. States that other than the pain she is feeling terrific.    Review of Systems Interview limited by [facility]. Between the patient and her daughter I obtain the following information: No fever chills, appetite is okay, no recent falls. No actual back ache.   Past Medical History:  Diagnosis Date  . Anemia   . Chronic renal insufficiency   . Diabetes mellitus   . Hard of hearing   . Hyperlipidemia   . Hypertension   . Osteoarthritis   . Syphilis    ?latent?, treated c/ PCN 1993    Past Surgical History:  Procedure Laterality Date  . ABDOMINAL HYSTERECTOMY     BSO  . CATARACT EXTRACTION    . cochlear impalnt  1/02  . NEPHRECTOMY     left  . OOPHORECTOMY      Social History   Social History  . Marital status: Single    Spouse name: N/A  . Number of children: 5  . Years of education: N/A   Occupational History  . PT job   .  Retired   Social History Main Topics  . Smoking status: Former Games developer  . Smokeless tobacco: Never Used  . Alcohol use No  . Drug use: Unknown  . Sexual activity: No   Other Topics Concern  . Not on file   Social History Narrative   Son lives with her, still drives to work.   Her daughter Kirt Boys [phone])  lives 20 minutes away and usually comes w/ the pt to the office visit        Medication List       Accurate as of 01/22/16 11:59 PM. Always use your most recent med list.          acetaminophen 500 MG tablet Commonly  known as:  TYLENOL Take 500 mg by mouth every 6 (six) hours as needed.   aspirin 81 MG tablet Take 81 mg by mouth daily.   cloNIDine 0.1 MG tablet Commonly known as:  CATAPRES Take 1 tablet (0.1 mg total) by mouth 2 (two) times daily.   furosemide 20 MG tablet Commonly known as:  LASIX Take 1 tablet (20 mg total) by mouth daily.   glimepiride 2 MG tablet Commonly known as:  AMARYL Take 1 tablet (2 mg total) by mouth daily with breakfast.   glucose blood test strip Commonly known as:  RELION GLUCOSE TEST STRIPS Check no more than twice daily.   RELION ULTRA THIN PLUS LANCETS Misc Check no more than twice daily   simvastatin 40 MG tablet Commonly known as:  ZOCOR Take 1 tablet (40 mg total) by mouth at bedtime.   sitaGLIPtin 50 MG tablet Commonly known as:  JANUVIA Take 1 tablet (50 mg total) by mouth daily.   trolamine salicylate 10 % cream Commonly known as:  ASPERCREME Apply topically as needed.   valACYclovir 1000 MG tablet Commonly known as:  VALTREX Take 1 tablet (1,000 mg  total) by mouth daily.   vitamin B-12 100 MCG tablet Commonly known as:  CYANOCOBALAMIN Take 100 mcg by mouth daily.          Objective:   Physical Exam BP 124/70 (BP Location: Right Arm, Patient Position: Sitting, Cuff Size: Small)   Pulse 62   Temp 98 F (36.7 C) (Oral)   Resp 12   Ht 5\' 1"  (1.549 m)   Wt 138 lb (62.6 kg)   SpO2 97%   BMI 26.07 kg/m  Exam is very limited, hard for her to get on th examining table  General:   Well developed, well nourished sitting on a wheelchair.  HEENT:  Normocephalic . Face symmetric, atraumatic  Skin:  Lower extremities: Skin is dry, no actual blisters, few patches of round macular slt scaly skin at inner thigh B. Previously seen abscess of the buttock is resolved with no frequency. Neurologic:  alert & oriented X3. Interview is limited by her hearing. Speech normal Gait is very limited, I was able to get her up from her chair and  took a few steps but he was very difficult for her. She was favoring the left leg. Declined to let me get DTRs, motor seems symmetric Soft palpation on the skin cause dyscomfort, particularly the inner aspect of the right tight. That area seems a slightly red but no clear-cut cellulitis. Psych--  Behavior appropriate. No anxious or depressed appearing.      Assessment & Plan:  Assessment > DM, no neuropathy per foot exam 01-2015 HTN Hyperlipidemia Chronic renal insufficiency, declined to see nephrology consistently Anemia DJD Latent syphilis? S/p Penicillin 1993  PLAN: Right leg pain:  Seems to be a mechanical pain-radiculopathy ( worsening by ambulation); on the other hand she has some neuropathic features (burning pain,allodynea). No obvious rash consisting with shingles states symptoms are reminiscent of that condition. Plan:  X-rays back and hip Valtrex 1 g daily 7 adjusted for renal insufficiency (if this is indeed shingles she needs treatment) Pain control with Tylenol, if not effective consider low-dose of prednisone. We'll try to avoid narcotics, unable to prescribe NSAIDs due to renal failure. RTC 10 days. Sooner if fever, chills, severe symptoms.

## 2016-01-24 NOTE — Assessment & Plan Note (Signed)
Right leg pain:  Seems to be a mechanical pain-radiculopathy ( worsening by ambulation); on the other hand she has some neuropathic features (burning pain,allodynea). No obvious rash consisting with shingles states symptoms are reminiscent of that condition. Plan:  X-rays back and hip Valtrex 1 g daily 7 adjusted for renal insufficiency (if this is indeed shingles she needs treatment) Pain control with Tylenol, if not effective consider low-dose of prednisone. We'll try to avoid narcotics, unable to prescribe NSAIDs due to renal failure. RTC 10 days. Sooner if fever, chills, severe symptoms.

## 2016-01-29 ENCOUNTER — Telehealth: Payer: Self-pay | Admitting: Internal Medicine

## 2016-01-29 NOTE — Telephone Encounter (Signed)
Please call the patient's daughter [name], ask how the patient is doing.  She had leg pain. Feeling better? Fever chills? Rash?,

## 2016-02-01 ENCOUNTER — Encounter: Payer: Self-pay | Admitting: Internal Medicine

## 2016-02-01 ENCOUNTER — Ambulatory Visit (INDEPENDENT_AMBULATORY_CARE_PROVIDER_SITE_OTHER): Payer: Medicare Other | Admitting: Internal Medicine

## 2016-02-01 VITALS — BP 130/76 | HR 72 | Temp 98.1°F | Resp 14 | Ht 61.0 in | Wt 138.2 lb

## 2016-02-01 DIAGNOSIS — M79604 Pain in right leg: Secondary | ICD-10-CM | POA: Diagnosis not present

## 2016-02-01 DIAGNOSIS — E1121 Type 2 diabetes mellitus with diabetic nephropathy: Secondary | ICD-10-CM | POA: Diagnosis not present

## 2016-02-01 LAB — BASIC METABOLIC PANEL
BUN: 32 mg/dL — ABNORMAL HIGH (ref 6–23)
CHLORIDE: 106 meq/L (ref 96–112)
CO2: 27 mEq/L (ref 19–32)
Calcium: 9.9 mg/dL (ref 8.4–10.5)
Creatinine, Ser: 1.74 mg/dL — ABNORMAL HIGH (ref 0.40–1.20)
GFR: 35.31 mL/min — AB (ref >60.00)
GLUCOSE: 87 mg/dL (ref 70–99)
POTASSIUM: 4.7 meq/L (ref 3.5–5.1)
SODIUM: 139 meq/L (ref 135–145)

## 2016-02-01 LAB — HEMOGLOBIN A1C: HEMOGLOBIN A1C: 8.3 % — AB (ref 4.6–6.5)

## 2016-02-01 NOTE — Progress Notes (Signed)
Pre visit review using our clinic review tool, if applicable. No additional management support is needed unless otherwise documented below in the visit note. 

## 2016-02-01 NOTE — Telephone Encounter (Addendum)
Opened in error

## 2016-02-01 NOTE — Telephone Encounter (Signed)
Pt has follow-up appt w/ PCP today.

## 2016-02-01 NOTE — Progress Notes (Signed)
Subjective:    Patient ID: Jeanne Barker, female    DOB: Mar 04, 1924, 82 y.o.   MRN: [id number]  DOS:  02/01/2016 Type of visit - description : f/u previous visit Interval history:  Since the last visit, x-ray showed DJD, she took Valtrex and is taking Tylenol. Pain has decrease little and she is able to move slightly easier than before but is not back to baseline. Today reports the pain is mostly at the back, hip and knee.   Review of Systems Denies fever chills States that the "rash" got better after she took Valtrex No problems with bladder or bowel incontinence  Past Medical History:  Diagnosis Date  . Anemia   . Chronic renal insufficiency   . Diabetes mellitus   . Hard of hearing   . Hyperlipidemia   . Hypertension   . Osteoarthritis   . Syphilis    ?latent?, treated c/ PCN 1993    Past Surgical History:  Procedure Laterality Date  . ABDOMINAL HYSTERECTOMY     BSO  . CATARACT EXTRACTION    . cochlear impalnt  1/02  . NEPHRECTOMY     left  . OOPHORECTOMY      Social History   Social History  . Marital status: Single    Spouse name: N/A  . Number of children: 5  . Years of education: N/A   Occupational History  . PT job   .  Retired   Social History Main Topics  . Smoking status: Former Games developer  . Smokeless tobacco: Never Used  . Alcohol use No  . Drug use: Unknown  . Sexual activity: No   Other Topics Concern  . Not on file   Social History Narrative   Son lives with her, still drives to work.   Her daughter Kirt Boys [phone])  lives 20 minutes away and usually comes w/ the pt to the office visit        Medication List       Accurate as of 02/01/16 11:59 PM. Always use your most recent med list.          acetaminophen 500 MG tablet Commonly known as:  TYLENOL Take 500 mg by mouth every 6 (six) hours as needed.   aspirin 81 MG tablet Take 81 mg by mouth daily.   cloNIDine 0.1 MG tablet Commonly known as:  CATAPRES Take 1  tablet (0.1 mg total) by mouth 2 (two) times daily.   furosemide 20 MG tablet Commonly known as:  LASIX Take 1 tablet (20 mg total) by mouth daily.   glimepiride 2 MG tablet Commonly known as:  AMARYL Take 1 tablet (2 mg total) by mouth daily with breakfast.   glucose blood test strip Commonly known as:  RELION GLUCOSE TEST STRIPS Check no more than twice daily.   RELION ULTRA THIN PLUS LANCETS Misc Check no more than twice daily   simvastatin 40 MG tablet Commonly known as:  ZOCOR Take 1 tablet (40 mg total) by mouth at bedtime.   sitaGLIPtin 50 MG tablet Commonly known as:  JANUVIA Take 1 tablet (50 mg total) by mouth daily.   trolamine salicylate 10 % cream Commonly known as:  ASPERCREME Apply topically as needed.   vitamin B-12 100 MCG tablet Commonly known as:  CYANOCOBALAMIN Take 100 mcg by mouth daily.          Objective:   Physical Exam BP 130/76 (BP Location: Right Arm, Patient Position: Sitting, Cuff Size: Small)  Pulse 72   Temp 98.1 F (36.7 C) (Oral)   Resp 14   Ht 5\' 1"  (1.549 m)   Wt 138 lb 4 oz (62.7 kg)   SpO2 96%   BMI 26.12 kg/m  General:   Well developed, looks more comfortable compared to the last time, she is able to transfer from a wheelchair to a chair with some help. She was able to stand up for me and again looks more comfortable HEENT:  Normocephalic . Face symmetric, atraumatic MSK: + TTP at the lower back, bilaterally. Skin: Not pale. Lower extremities without blisters, + dry skin. Neurologic:  alert & oriented X3.  Speech normal, gait not tested but  was able to transfer easier than the last time, when standing, was able to move her hips, range of motion is slightly decreased on the right side. Declined a DTRs. Psych--  Behavior appropriate. No anxious or depressed appearing.      Assessment & Plan:   Assessment > DM, no neuropathy per foot exam 01-2015 HTN Hyperlipidemia Chronic renal insufficiency, declined to see  nephrology consistently Anemia DJD Latent syphilis? S/p Penicillin 1993  PLAN: Right leg pain: Since the last time he was here, she took Valtrex , had x-rays of the back (showed DJD); pain has slightly decrease. Today  she reports the pain is concentrated mostly at the back, right hip, right knee. Previously, shingles was a concern, she had Valtrex. Other considerations are back-hip DJD and a radiculopathy. Pain is manageable for now , avoid stronger medication Plan: Continue Tylenol, see orthopedic surgery as recommended. They may consider further x-rays of the hip or a MRI of the back although the treatment will be conservative . DM:  Check a BMP and A1c. Continue with Januvia, glimepiride. RTC 3 months

## 2016-02-01 NOTE — Patient Instructions (Addendum)
GO TO THE LAB : Get the blood work     GO TO THE FRONT DESK Schedule your next appointment for a  Check up in 3 months   Continue tylenol

## 2016-02-02 DIAGNOSIS — M1711 Unilateral primary osteoarthritis, right knee: Secondary | ICD-10-CM | POA: Diagnosis not present

## 2016-02-02 NOTE — Assessment & Plan Note (Signed)
Right leg pain: Since the last time he was here, she took Valtrex , had x-rays of the back (showed DJD); pain has slightly decrease. Today  she reports the pain is concentrated mostly at the back, right hip, right knee. Previously, shingles was a concern, she had Valtrex. Other considerations are back-hip DJD and a radiculopathy. Pain is manageable for now , avoid stronger medication Plan: Continue Tylenol, see orthopedic surgery as recommended. They may consider further x-rays of the hip or a MRI of the back although the treatment will be conservative . DM:  Check a BMP and A1c. Continue with Januvia, glimepiride. RTC 3 months

## 2016-03-09 ENCOUNTER — Other Ambulatory Visit: Payer: Self-pay | Admitting: Internal Medicine

## 2016-04-12 ENCOUNTER — Other Ambulatory Visit: Payer: Self-pay | Admitting: Internal Medicine

## 2016-04-15 ENCOUNTER — Telehealth: Payer: Self-pay | Admitting: Internal Medicine

## 2016-04-15 DIAGNOSIS — M159 Polyosteoarthritis, unspecified: Secondary | ICD-10-CM

## 2016-04-15 NOTE — Telephone Encounter (Signed)
Please provide a prescription for a lift chair: DX DJD, anemia, debility

## 2016-04-15 NOTE — Telephone Encounter (Signed)
Pt's daughter dropped off a letter for pt. She says that medicare is stating that they will assist pt with a lift chair and they will pay for it it pcp provides a Rx for it.  Forwarded letter to PCP.

## 2016-04-18 NOTE — Telephone Encounter (Signed)
Lift Chair Rx printed. Spoke w/ Mollie, Pt's daughter, informed that Rx has been printed and placed at front desk for pick up. Mollie verbalized understanding.

## 2016-04-18 NOTE — Addendum Note (Signed)
Addended by[place]rad [place]: Niquita Digioia D on: 04/18/2016 10:08 AM   Modules accepted: Orders

## 2016-05-07 ENCOUNTER — Other Ambulatory Visit: Payer: Self-pay | Admitting: Internal Medicine

## 2016-05-11 ENCOUNTER — Ambulatory Visit: Payer: Medicare Other | Admitting: Internal Medicine

## 2016-05-18 ENCOUNTER — Ambulatory Visit (INDEPENDENT_AMBULATORY_CARE_PROVIDER_SITE_OTHER): Payer: Medicare Other | Admitting: Internal Medicine

## 2016-05-18 ENCOUNTER — Encounter: Payer: Self-pay | Admitting: Internal Medicine

## 2016-05-18 ENCOUNTER — Ambulatory Visit: Payer: Medicare Other | Admitting: *Deleted

## 2016-05-18 VITALS — BP 144/58 | HR 76 | Resp 18 | Ht 61.0 in | Wt 134.0 lb

## 2016-05-18 DIAGNOSIS — E1121 Type 2 diabetes mellitus with diabetic nephropathy: Secondary | ICD-10-CM

## 2016-05-18 DIAGNOSIS — M25569 Pain in unspecified knee: Secondary | ICD-10-CM | POA: Diagnosis not present

## 2016-05-18 DIAGNOSIS — N183 Chronic kidney disease, stage 3 unspecified: Secondary | ICD-10-CM

## 2016-05-18 DIAGNOSIS — E785 Hyperlipidemia, unspecified: Secondary | ICD-10-CM | POA: Diagnosis not present

## 2016-05-18 DIAGNOSIS — I10 Essential (primary) hypertension: Secondary | ICD-10-CM | POA: Diagnosis not present

## 2016-05-18 DIAGNOSIS — Z0000 Encounter for general adult medical examination without abnormal findings: Secondary | ICD-10-CM | POA: Diagnosis not present

## 2016-05-18 LAB — LIPID PANEL
CHOLESTEROL: 178 mg/dL (ref 0–200)
HDL: 77.9 mg/dL (ref >39.00)
LDL Cholesterol: 77 mg/dL (ref 0–99)
NonHDL: 100.04
Total CHOL/HDL Ratio: 2
Triglycerides: 113 mg/dL (ref 0.0–149.0)
VLDL: 22.6 mg/dL (ref 0.0–40.0)

## 2016-05-18 LAB — CBC WITH DIFFERENTIAL/PLATELET
BASOS ABS: 0.1 10*3/uL (ref 0.0–0.1)
BASOS PCT: 1.1 % (ref 0.0–3.0)
EOS ABS: 0.2 10*3/uL (ref 0.0–0.7)
Eosinophils Relative: 2.9 % (ref 0.0–5.0)
HEMATOCRIT: 35.4 % — AB (ref 36.0–46.0)
Hemoglobin: 11.5 g/dL — ABNORMAL LOW (ref 12.0–15.0)
LYMPHS ABS: 2 10*3/uL (ref 0.7–4.0)
Lymphocytes Relative: 34.1 % (ref 12.0–46.0)
MCHC: 32.5 g/dL (ref 30.0–36.0)
MCV: 87 fl (ref 78.0–100.0)
MONO ABS: 0.5 10*3/uL (ref 0.1–1.0)
Monocytes Relative: 8.6 % (ref 3.0–12.0)
NEUTROS ABS: 3.1 10*3/uL (ref 1.4–7.7)
NEUTROS PCT: 53.3 % (ref 43.0–77.0)
PLATELETS: 319 10*3/uL (ref 150.0–400.0)
RBC: 4.07 Mil/uL (ref 3.87–5.11)
RDW: 13.6 % (ref 11.5–15.5)
WBC: 5.9 10*3/uL (ref 4.0–10.5)

## 2016-05-18 LAB — BASIC METABOLIC PANEL
BUN: 40 mg/dL — AB (ref 6–23)
CALCIUM: 10.2 mg/dL (ref 8.4–10.5)
CO2: 25 mEq/L (ref 19–32)
CREATININE: 1.57 mg/dL — AB (ref 0.40–1.20)
Chloride: 106 mEq/L (ref 96–112)
GFR: 39.74 mL/min — AB (ref >60.00)
GLUCOSE: 73 mg/dL (ref 70–99)
POTASSIUM: 4.3 meq/L (ref 3.5–5.1)
Sodium: 139 mEq/L (ref 135–145)

## 2016-05-18 LAB — ALT: ALT: 16 U/L (ref 0–35)

## 2016-05-18 LAB — HEMOGLOBIN A1C: HEMOGLOBIN A1C: 7.8 % — AB (ref 4.6–6.5)

## 2016-05-18 LAB — AST: AST: 21 U/L (ref 0–37)

## 2016-05-18 NOTE — Progress Notes (Signed)
Subjective:    Patient ID: Jeanne Barker, female    DOB: 1923-06-09, 83 y.o.   MRN: [id number]  DOS:  05/18/2016 Type of visit - description : Routine checkup & Medicare wellness visit, here with her daughter Interval history: No major concerns except knee pain. Hurts constantly, worse with ambulation. Denies any swelling or redness. Tylenol is not helping enough DM: Good med compliance, ambulatory CBGs around 110, denies symptoms consistent with low blood sugars High cholesterol: On simvastatin. HTN: Good med compliance, not ambulatory BPs.    Review of Systems Denies chest pain, difficulty breathing or lower extremity edema No nausea, vomiting, diarrhea. "I feel terrific".  Past Medical History:  Diagnosis Date  . Anemia   . Chronic renal insufficiency   . Diabetes mellitus   . Hard of hearing   . Hyperlipidemia   . Hypertension   . Osteoarthritis   . Syphilis    ?latent?, treated c/ PCN 1993    Past Surgical History:  Procedure Laterality Date  . ABDOMINAL HYSTERECTOMY     BSO  . CATARACT EXTRACTION    . cochlear impalnt  1/02  . NEPHRECTOMY     left  . OOPHORECTOMY      Social History   Social History  . Marital status: Single    Spouse name: N/A  . Number of children: 5  . Years of education: N/A   Occupational History  . PT job   .  Retired   Social History Main Topics  . Smoking status: Former Games developer  . Smokeless tobacco: Never Used  . Alcohol use No  . Drug use: No  . Sexual activity: No   Other Topics Concern  . Not on file   Social History Narrative   Son lives with her, still drives to work.   Her daughter Kirt Boys [phone])  lives 20 minutes away and usually comes w/ the pt to the office visit      Allergies as of 05/18/2016      Reactions   Amlodipine Besylate Nausea Only   Lisinopril Other (See Comments)   REACTION: hyperkalemia      Medication List       Accurate as of 05/18/16 11:59 PM. Always use your most recent med  list.          acetaminophen 500 MG tablet Commonly known as:  TYLENOL Take 500 mg by mouth every 6 (six) hours as needed.   aspirin 81 MG tablet Take 81 mg by mouth daily.   cloNIDine 0.1 MG tablet Commonly known as:  CATAPRES Take 1 tablet (0.1 mg total) by mouth 2 (two) times daily.   furosemide 20 MG tablet Commonly known as:  LASIX Take 1 tablet (20 mg total) by mouth daily.   glimepiride 2 MG tablet Commonly known as:  AMARYL Take 1 tablet (2 mg total) by mouth daily with breakfast.   glucose blood test strip Commonly known as:  RELION GLUCOSE TEST STRIPS Check no more than twice daily.   RELION ULTRA THIN PLUS LANCETS Misc Check no more than twice daily   simvastatin 40 MG tablet Commonly known as:  ZOCOR Take 1 tablet (40 mg total) by mouth at bedtime.   sitaGLIPtin 50 MG tablet Commonly known as:  JANUVIA Take 1 tablet (50 mg total) by mouth daily.   trolamine salicylate 10 % cream Commonly known as:  ASPERCREME Apply topically as needed.   vitamin B-12 100 MCG tablet Commonly known as:  CYANOCOBALAMIN  Take 100 mcg by mouth daily.          Objective:   Physical Exam BP (!) 144/58 (BP Location: Left Arm, Patient Position: Sitting, Cuff Size: Normal)   Pulse 76   Resp 18   Ht 5\' 1"  (1.549 m)   Wt 134 lb (60.8 kg)   SpO2 99%   BMI 25.32 kg/m  General:   Well developed, well nourished . NAD.  HEENT:  Normocephalic . Face symmetric, atraumatic. Neck: No thyromegaly Lungs:  CTA B Normal respiratory effort, no intercostal retractions, no accessory muscle use. Heart: RRR,  no murmur.  No pretibial edema bilaterally  Skin: Not pale. Not jaundice MSK: Right knee with some deformities consistent with DJD, slightly puffy on the inner aspect and TTP. No effusion per se, no redness or warmness Neurologic:  alert & oriented X3.  Speech normal, gait appropriate for age and unassisted Psych--  Cognition and judgment appear intact.  Cooperative  with normal attention span and concentration.  Behavior appropriate. No anxious or depressed appearing.      Assessment & Plan:   Assessment   DM, no neuropathy per foot exam 01-2015 HTN Hyperlipidemia Chronic renal insufficiency, declined to see nephrology consistently Anemia DJD Latent syphilis? S/p Penicillin 1993  PLAN: DM: On Januvia and glimepiride, last A1c 8.3. We are not looking a a tight control, rechecking labs. High cholesterol: On simvastatin, check labs HTN: On Lasix, clonidine, checking labs, BP today is very good, no ambulatory BPs. DJD, knee: This is only thing that bothers her, Tylenol not helping enough. Request a referral. She believes her high arch (foot) may be playing a role and likes to see if needs a shoe insert. Plan: Refer to sports medicine for evaluation, local injection? Shoe insert? Of note, she is doing great mentally, her MMSE is 22, few points were drop due to illiteracy RTC 6 months

## 2016-05-18 NOTE — Patient Instructions (Addendum)
GO TO THE LAB : Get the blood work     GO TO THE FRONT DESK Schedule your next appointment for a  Check up with Dr Drue NovelPaz in 6 months   We will call you w/ podiatry referral.  Create your advance directives and bring a copy to your next office visit.  Advance Directive Advance directives are the legal documents that allow you to make choices about your health care and medical treatment if you cannot speak for yourself. Advance directives are a way for you to communicate your wishes to family, friends, and health care providers. The specified people can then convey your decisions about end-of-life care to avoid confusion if you should become unable to communicate. Ideally, the process of discussing and writing advance directives should happen over time rather than making decisions all at once. Advance directives can be modified as your situation changes, and you can change your mind at any time, even after you have signed the advance directives. Each state has its own laws regarding advance directives. You may want to check with your health care provider, attorney, or state representative about the law in your state. Below are some examples of advance directives. HEALTH CARE PROXY AND DURABLE POWER OF ATTORNEY FOR HEALTH CARE A health care proxy is a person (agent) appointed to make medical decisions for you if you cannot. Generally, people choose someone they know well and trust to represent their preferences when they can no longer do so. You should be sure to ask this person for agreement to act as your agent. An agent may have to exercise judgment in the event of a medical decision for which your wishes are not known. A durable power of attorney for health care is a legal document that names your health care proxy. Depending on the laws in your state, after the document is written, it may also need to be:  Signed.  Notarized.  Dated.  Copied.  Witnessed.  Incorporated into your medical  record. You may also want to appoint someone to manage your financial affairs if you cannot. This is called a durable power of attorney for finances. It is a separate legal document from the durable power of attorney for health care. You may choose the same person or someone different from your health care proxy to act as your agent in financial matters. LIVING WILL A living will is a set of instructions documenting your wishes about medical care when you cannot care for yourself. It is used if you become:  Terminally ill.  Incapacitated.  Unable to communicate.  Unable to make decisions. Items to consider in your living will include:  The use or non-use of life-sustaining equipment, such as dialysis machines and breathing machines (ventilators).  A do not resuscitate (DNR) order, which is the instruction not to use cardiopulmonary resuscitation (CPR) if breathing or heartbeat stops.  Tube feeding.  Withholding of food and fluids.  Comfort (palliative) care when the goal becomes comfort rather than a cure.  Organ and tissue donation. A living will does not give instructions about distribution of your money and property if you should pass away. It is advisable to seek the expert advice of a lawyer in drawing up a will regarding your possessions. Decisions about taxes, beneficiaries, and asset distribution will be legally binding. This process can relieve your family and friends of any burdens surrounding disputes or questions that may come up about the allocation of your assets. DO NOT RESUSCITATE (DNR) A do  not resuscitate (DNR) order is a request to not have CPR in the event that your heart stops beating or you stop breathing. Unless given other instructions, a health care provider will try to help any patient whose heart has stopped or who has stopped breathing.  This information is not intended to replace advice given to you by your health care provider. Make sure you discuss any  questions you have with your health care provider. Document Released: 07/05/2007 Document Revised: 07/20/2015 Document Reviewed: 08/15/2012 Elsevier Interactive Patient Education  2017 [employer].

## 2016-05-18 NOTE — Assessment & Plan Note (Signed)
Patient reports compliance w/ medication, no issues reported. Does not follow a particular diet, does not count carbs. She is checking fasting CBGs approximately 3x weekly, reports average CBG of 110. Ongoing management per PCP.   Lab Results  Component Value Date   HGBA1C 8.3 (H) 02/01/2016

## 2016-05-18 NOTE — Progress Notes (Signed)
Pre visit review using our clinic review tool, if applicable. No additional management support is needed unless otherwise documented below in the visit note. 

## 2016-05-18 NOTE — Assessment & Plan Note (Addendum)
Had a flu shot, declined other immunizations  Never had a bone density test, declined

## 2016-05-18 NOTE — Progress Notes (Signed)
Subjective:   Jeanne Barker is a 83 y.o. female who presents for an Initial Medicare Annual Wellness Visit. She is here today with her daughter.  The Patient was informed that the wellness visit is to identify future health risk and educate and initiate measures that can reduce risk for increased disease through the lifespan.   Describes health as fair, good or great? "I think it's great."  She continues to experience R knee pain for which she has previously seen PCP. She is wearing a knee brace today.  Review of Systems    No ROS.  Medicare Wellness Visit.  Cardiac Risk Factors include: advanced age (>96men, >64 women);diabetes mellitus;hypertension  Sleep patterns: no sleep issues and gets up 3 times nightly to void.   Home Safety/Smoke Alarms: Feels safe in home. Smoke alarms and carbon monoxide detectors in place.    Living environment; residence and Firearm Safety: Lives w/ son. 1-story house/ trailer, walk-in shower, equipment: Medical laboratory scientific officer, Type: Single [employer], Type: Tub Dentist. Seat Belt Safety/Bike Helmet: Wears seat belt.   Counseling:   Eye Exam- Dr. Jimmey Ralph yearly. Dental- Has dentures, but does not wear them often. Does not follow w/ dentist regularly.  Female:   Pap- Aged out       Mammo- Aged out Dexa scan- Not on file        CCS- Aged out    Objective:    Today's Vitals   05/18/16 0852 05/18/16 0855  BP: (!) 144/58   Pulse: 76   Resp: 18   SpO2: 99%   Weight: 134 lb (60.8 kg)   Height: 5\' 1"  (1.549 m)   PainSc:  4    Body mass index is 25.32 kg/m.   Current Medications (verified) Outpatient Encounter Prescriptions as of 05/18/2016  Medication Sig  . acetaminophen (TYLENOL) 500 MG tablet Take 500 mg by mouth every 6 (six) hours as needed.    Marland Kitchen aspirin 81 MG tablet Take 81 mg by mouth daily.    . cloNIDine (CATAPRES) 0.1 MG tablet Take 1 tablet (0.1 mg total) by mouth 2 (two) times daily.  . furosemide (LASIX) 20 MG tablet Take 1  tablet (20 mg total) by mouth daily.  Marland Kitchen glimepiride (AMARYL) 2 MG tablet Take 1 tablet (2 mg total) by mouth daily with breakfast.  . glucose blood (RELION GLUCOSE TEST STRIPS) test strip Check no more than twice daily.  Marland Kitchen RELION ULTRA THIN PLUS LANCETS MISC Check no more than twice daily  . simvastatin (ZOCOR) 40 MG tablet Take 1 tablet (40 mg total) by mouth at bedtime.  . sitaGLIPtin (JANUVIA) 50 MG tablet Take 1 tablet (50 mg total) by mouth daily.  Marland Kitchen trolamine salicylate (ASPERCREME) 10 % cream Apply topically as needed.  . vitamin B-12 (CYANOCOBALAMIN) 100 MCG tablet Take 100 mcg by mouth daily.    No facility-administered encounter medications on file as of 05/18/2016.     Allergies (verified) Amlodipine besylate and Lisinopril   History: Past Medical History:  Diagnosis Date  . Anemia   . Chronic renal insufficiency   . Diabetes mellitus   . Hard of hearing   . Hyperlipidemia   . Hypertension   . Osteoarthritis   . Syphilis    ?latent?, treated c/ PCN 1993   Past Surgical History:  Procedure Laterality Date  . ABDOMINAL HYSTERECTOMY     BSO  . CATARACT EXTRACTION    . cochlear impalnt  1/02  . NEPHRECTOMY  left  . OOPHORECTOMY     History reviewed. No pertinent family history. Social History   Occupational History  . PT job   .  Retired   Social History Main Topics  . Smoking status: Former Games developermoker  . Smokeless tobacco: Never Used  . Alcohol use No  . Drug use: No  . Sexual activity: No    Tobacco Counseling Counseling given: Not Answered   Activities of Daily Living In your present state of health, do you have any difficulty performing the following activities: 05/18/2016 07/23/2015  Hearing? Y N  Vision? N N  Difficulty concentrating or making decisions? N N  Walking or climbing stairs? Y Y  Dressing or bathing? N N  Doing errands, shopping? Malvin JohnsY Y  Preparing Food and eating ? N -  Using the Toilet? N -  In the past six months, have you  accidently leaked urine? N -  Do you have problems with loss of bowel control? N -  Managing your Medications? N -  Managing your Finances? Y -  Housekeeping or managing your Housekeeping? N -  Some recent data might be hidden    Immunizations and Health Maintenance Immunization History  Administered Date(s) Administered  . Influenza Split 12/10/2013  . Influenza Whole 03/16/2007, 01/30/2009, 12/16/2009  . Influenza, High Dose Seasonal PF 12/10/2012  . Influenza-Unspecified 01/28/2016  . Pneumococcal Polysaccharide-23 02/06/2002  . Td 02/06/2002   Health Maintenance Due  Topic Date Due  . FOOT EXAM  01/22/2016    Patient Care Team: Wanda PlumpJose E Paz, MD as PCP - General Guy Sandiferolby Alan Robbins, [place] (Orthopedic Surgery) Diana EvesSuzanne Parker, DO as Consulting Physician (Optometry)  Indicate any recent Medical Services you may have received from other than [facility] providers in the past year (date may be approximate).     Assessment:   This is a routine wellness examination for Jeanne Barker. Physical assessment deferred to PCP.  Hearing/Vision screen Hearing Screening Comments: Cochlear implant R ear. Hard of hearing[facility] Vision Screening Comments: Wears glasses. Follows w/ Dr. Jimmey RalphParker yearly.  Dietary issues and exercise activities discussed: Current Exercise Habits: The patient does not participate in regular exercise at present, Exercise limited by: orthopedic condition(s) (knee pain)  Diet (meal preparation, eat out, water intake, caffeinated beverages, dairy products, fruits and vegetables): well balanced, on average, 3 meals per day. Cooks 1 meal daily. Snacks throughout the day, likes oatmeal cookies. Drinks milk and juice. Drinks water throughout the day.   Goals    . Maintain current health status      Depression Screen PHQ 2/9 Scores 05/18/2016 07/23/2015 01/22/2015 06/30/2014 06/17/2013  PHQ - 2 Score 0 0 0 0 0    Fall Risk Fall Risk  05/18/2016 07/23/2015 01/22/2015  06/30/2014 06/17/2013  Falls in the past year? No No No No No    Cognitive Function: MMSE - Mini Mental State Exam 05/18/2016  Orientation to time 5  Orientation to Place 5  Registration 3  Attention/ Calculation 0  Attention/Calculation-comments Pt does not spell or count.  Recall 2  Language- name 2 objects 2  Language- repeat 1  Language- follow 3 step command 3  Language- read & follow direction 0  Write a sentence 0  Write a sentence-comments Pt cannnot write. (3rd grade education)  Copy design 1  Total score 22        Screening Tests Health Maintenance  Topic Date Due  . FOOT EXAM  01/22/2016  . DEXA SCAN  01/31/2017 (  Originally 08/15/1990)  . TETANUS/TDAP  01/31/2017 (Originally 02/07/2012)  . PNA vac Low Risk Adult (2 of 2 - PCV13) 01/31/2017 (Originally 02/07/2003)  . URINE MICROALBUMIN  07/22/2016  . HEMOGLOBIN A1C  08/01/2016  . OPHTHALMOLOGY EXAM  09/09/2016  . INFLUENZA VACCINE  Completed      Plan:    Follow-up w/ PCP as directed.  Bring a copy of your advance directives to your next office visit.  Pt declines Prevnar and DEXA scan; fracture risk discussed.  Requesting podiatry referral for possible orthotic insert-referral per PCP.  During the course of the visit, Jeanne Barker was educated and counseled about the following appropriate screening and preventive services:   Vaccines to include Pneumoccal, Influenza, Hepatitis B, Td, Zostavax, HCV  Cardiovascular disease screening  Bone density screening  Diabetes screening  Glaucoma screening  Nutrition counseling  Patient Instructions (the written plan) were given to the patient.    Starla Link, RN   05/18/2016   [name], MD

## 2016-05-19 NOTE — Assessment & Plan Note (Signed)
DM: On Januvia and glimepiride, last A1c 8.3. We are not looking a a tight control, rechecking labs. High cholesterol: On simvastatin, check labs HTN: On Lasix, clonidine, checking labs, BP today is very good, no ambulatory BPs. DJD, knee: This is only thing that bothers her, Tylenol not helping enough. Request a referral. She believes her high arch (foot) may be playing a role and likes to see if needs a shoe insert. Plan: Refer to sports medicine for evaluation, local injection? Shoe insert? Of note, she is doing great mentally, her MMSE is 22, few points were drop due to illiteracy RTC 6 months

## 2016-05-20 ENCOUNTER — Ambulatory Visit (INDEPENDENT_AMBULATORY_CARE_PROVIDER_SITE_OTHER): Payer: Medicare Other | Admitting: Family Medicine

## 2016-05-20 ENCOUNTER — Encounter: Payer: Self-pay | Admitting: Family Medicine

## 2016-05-20 VITALS — BP 102/55 | HR 59 | Ht 62.0 in | Wt 134.0 lb

## 2016-05-20 DIAGNOSIS — M1711 Unilateral primary osteoarthritis, right knee: Secondary | ICD-10-CM | POA: Diagnosis not present

## 2016-05-20 MED ORDER — SODIUM HYALURONATE (VISCOSUP) 25 MG/2.5ML IX SOSY
2.5000 mL | PREFILLED_SYRINGE | Freq: Once | INTRA_ARTICULAR | Status: AC
  Administered 2016-05-20: 2.5 mL via INTRA_ARTICULAR

## 2016-05-20 NOTE — Patient Instructions (Addendum)
Your knee pain is due to arthritis. These are the different medicines you can take for this: Tylenol 500mg  1-2 tabs three times a day for pain. Glucosamine sulfate 750mg  twice a day is a supplement that may help. Some supplements that may help for arthritis: Boswellia extract, curcumin, pycnogenol. Cortisone injections are an option - you tried these recently. We will get approval and order the gel shots - we will call you when we have these in to start them. It's important that you continue to stay active. Straight leg raises, knee extensions 3 sets of 10 once a day (add ankle weight if these become too easy). Consider physical therapy to strengthen muscles around the joint that hurts to take pressure off of the joint itself. Shoe inserts with good arch support may be helpful (the green insoles). Walker or cane if needed. Heat or ice 15 minutes at a time 3-4 times a day as needed to help with pain. Water aerobics and cycling with low resistance are the best two types of exercise for arthritis.

## 2016-05-25 DIAGNOSIS — M1711 Unilateral primary osteoarthritis, right knee: Secondary | ICD-10-CM | POA: Insufficient documentation

## 2016-05-25 NOTE — Assessment & Plan Note (Signed)
S/p cortisone without much benefit.  Discussed tylenol, topical medications, some supplements that may help.  Started supartz series today.  Shown home exercises to do daily.  Continue use of cane.  Heat/ice.  F/u in 1 week for second injection.  After informed written consent, patient was seated on exam table. Right knee was prepped with alcohol swab and utilizing anteromedial approach, patient's right knee was injected intraarticularly with 3mL bupivicaine followed by supartz depomedrol. Patient tolerated the procedure well without immediate complications.

## 2016-05-25 NOTE — Progress Notes (Addendum)
PCP and consultation requested by: [name], MD  Subjective:   HPI: Patient is a 83 y.o. female here for right knee pain.  Patient reports she's had several year history of right knee pain. Worse with walking. Describes pain as sharp, 5/10 level, anterior. Using a brace, cane. Taking tylenol. Had cortisone shot before Christmas. Has known arthritis of this knee confirmed on radiographs and CT scan. No skin changes, numbness.  Past Medical History:  Diagnosis Date  . Anemia   . Chronic renal insufficiency   . Diabetes mellitus   . Hard of hearing   . Hyperlipidemia   . Hypertension   . Osteoarthritis   . Syphilis    ?latent?, treated c/ PCN 1993    Current Outpatient Prescriptions on File Prior to Visit  Medication Sig Dispense Refill  . acetaminophen (TYLENOL) 500 MG tablet Take 500 mg by mouth every 6 (six) hours as needed.      Marland Kitchen aspirin 81 MG tablet Take 81 mg by mouth daily.      . cloNIDine (CATAPRES) 0.1 MG tablet Take 1 tablet (0.1 mg total) by mouth 2 (two) times daily. 180 tablet 2  . furosemide (LASIX) 20 MG tablet Take 1 tablet (20 mg total) by mouth daily. 30 tablet 5  . glimepiride (AMARYL) 2 MG tablet Take 1 tablet (2 mg total) by mouth daily with breakfast. 90 tablet 1  . glucose blood (RELION GLUCOSE TEST STRIPS) test strip Check no more than twice daily. 100 each 12  . RELION ULTRA THIN PLUS LANCETS MISC Check no more than twice daily 100 each 12  . simvastatin (ZOCOR) 40 MG tablet Take 1 tablet (40 mg total) by mouth at bedtime. 90 tablet 1  . sitaGLIPtin (JANUVIA) 50 MG tablet Take 1 tablet (50 mg total) by mouth daily. 90 tablet 1  . trolamine salicylate (ASPERCREME) 10 % cream Apply topically as needed. 85 g 0  . vitamin B-12 (CYANOCOBALAMIN) 100 MCG tablet Take 100 mcg by mouth daily.      No current facility-administered medications on file prior to visit.     Past Surgical History:  Procedure Laterality Date  . ABDOMINAL HYSTERECTOMY     BSO   . CATARACT EXTRACTION    . cochlear impalnt  1/02  . NEPHRECTOMY     left  . OOPHORECTOMY      Allergies  Allergen Reactions  . Amlodipine Besylate Nausea Only  . Lisinopril Other (See Comments)    REACTION: hyperkalemia    Social History   Social History  . Marital status: Single    Spouse name: N/A  . Number of children: 5  . Years of education: N/A   Occupational History  . PT job   .  Retired   Social History Main Topics  . Smoking status: Former Games developer  . Smokeless tobacco: Never Used  . Alcohol use No  . Drug use: No  . Sexual activity: No   Other Topics Concern  . Not on file   Social History Narrative   Son lives with her, still drives to work.   Her daughter Kirt Boys [phone])  lives 20 minutes away and usually comes w/ the pt to the office visit    Family History  Problem Relation Age of Onset  . CAD Neg Hx     BP (!) 102/55   Pulse (!) 59   Ht  (1.575 m)   Wt 134 lb (60.8 kg)   BMI 24.51 kg/m  Review of Systems: See HPI above.     Objective:  Physical Exam:  Gen: NAD, comfortable in exam room  Right knee: No gross deformity, ecchymoses, effusion. TTP medial joint line > lateral.  No other tenderness. FROM. Negative ant/post drawers. Negative valgus/varus testing. Negative lachmanns. Negative mcmurrays, apleys, patellar apprehension. NV intact distally.  Left knee: FROM without pain.   Assessment & Plan:  1. Right knee pain - 2/2 DJD.  S/p cortisone without much benefit.  Discussed tylenol, topical medications, some supplements that may help.  Started supartz series today.  Shown home exercises to do daily.  Continue use of cane.  Heat/ice.  F/u in 1 week for second injection.  After informed written consent, patient was seated in chair in exam room. Right knee was prepped with alcohol swab and utilizing anteromedial approach, patient's right knee was injected intraarticularly with 3mL bupivicaine followed by supartz.  Patient tolerated the procedure well without immediate complications.

## 2016-05-27 ENCOUNTER — Ambulatory Visit (INDEPENDENT_AMBULATORY_CARE_PROVIDER_SITE_OTHER): Payer: Medicare Other | Admitting: Family Medicine

## 2016-05-27 ENCOUNTER — Encounter: Payer: Self-pay | Admitting: Family Medicine

## 2016-05-27 VITALS — BP 109/64 | HR 153 | Ht 61.0 in | Wt 135.0 lb

## 2016-05-27 DIAGNOSIS — M1711 Unilateral primary osteoarthritis, right knee: Secondary | ICD-10-CM

## 2016-05-27 MED ORDER — SODIUM HYALURONATE (VISCOSUP) 25 MG/2.5ML IX SOSY
2.5000 mL | PREFILLED_SYRINGE | Freq: Once | INTRA_ARTICULAR | Status: AC
  Administered 2016-05-27: 2.5 mL via INTRA_ARTICULAR

## 2016-05-27 NOTE — Progress Notes (Addendum)
PCP and consultation requested by: Jeanne OraJose Paz, MD  Subjective:   HPI: Patient is a 83 y.o. female here for right knee pain.  2/9: Patient reports she's had several year history of right knee pain. Worse with walking. Describes pain as sharp, 5/10 level, anterior. Using a brace, cane. Taking tylenol. Had cortisone shot before Christmas. Has known arthritis of this knee confirmed on radiographs and CT scan. No skin changes, numbness.  2/16: Patient returns for second supartz injection. Pain level 5/10, soreness anteriorly. No skin changes, numbness.  Past Medical History:  Diagnosis Date  . Anemia   . Chronic renal insufficiency   . Diabetes mellitus   . Hard of hearing   . Hyperlipidemia   . Hypertension   . Osteoarthritis   . Syphilis    ?latent?, treated c/ PCN 1993    Current Outpatient Prescriptions on File Prior to Visit  Medication Sig Dispense Refill  . acetaminophen (TYLENOL) 500 MG tablet Take 500 mg by mouth every 6 (six) hours as needed.      Marland Kitchen. aspirin 81 MG tablet Take 81 mg by mouth daily.      . cloNIDine (CATAPRES) 0.1 MG tablet Take 1 tablet (0.1 mg total) by mouth 2 (two) times daily. 180 tablet 2  . furosemide (LASIX) 20 MG tablet Take 1 tablet (20 mg total) by mouth daily. 30 tablet 5  . glimepiride (AMARYL) 2 MG tablet Take 1 tablet (2 mg total) by mouth daily with breakfast. 90 tablet 1  . glucose blood (RELION GLUCOSE TEST STRIPS) test strip Check no more than twice daily. 100 each 12  . RELION ULTRA THIN PLUS LANCETS MISC Check no more than twice daily 100 each 12  . simvastatin (ZOCOR) 40 MG tablet Take 1 tablet (40 mg total) by mouth at bedtime. 90 tablet 1  . sitaGLIPtin (JANUVIA) 50 MG tablet Take 1 tablet (50 mg total) by mouth daily. 90 tablet 1  . trolamine salicylate (ASPERCREME) 10 % cream Apply topically as needed. 85 g 0  . vitamin B-12 (CYANOCOBALAMIN) 100 MCG tablet Take 100 mcg by mouth daily.      No current facility-administered  medications on file prior to visit.     Past Surgical History:  Procedure Laterality Date  . ABDOMINAL HYSTERECTOMY     BSO  . CATARACT EXTRACTION    . cochlear impalnt  1/02  . NEPHRECTOMY     left  . OOPHORECTOMY      Allergies  Allergen Reactions  . Amlodipine Besylate Nausea Only  . Lisinopril Other (See Comments)    REACTION: hyperkalemia    Social History   Social History  . Marital status: Single    Spouse name: N/A  . Number of children: 5  . Years of education: N/A   Occupational History  . PT job   .  Retired   Social History Main Topics  . Smoking status: Former Games developermoker  . Smokeless tobacco: Never Used  . Alcohol use No  . Drug use: No  . Sexual activity: No   Other Topics Concern  . Not on file   Social History Narrative   Son lives with her, still drives to work.   Her daughter Jeanne Barker [phone])  lives 20 minutes away and usually comes w/ the pt to the office visit    Family History  Problem Relation Age of Onset  . CAD Neg Hx     BP 109/64   Pulse (!) 153  Ht 5\' 1"  (1.549 m)   Wt 135 lb (61.2 kg)   BMI 25.51 kg/m   Review of Systems: See HPI above.     Objective:  Physical Exam:  Gen: NAD, comfortable in exam room  Exam not repeated today. Right knee: No gross deformity, ecchymoses, effusion. TTP medial joint line > lateral.  No other tenderness. FROM. Negative ant/post drawers. Negative valgus/varus testing. Negative lachmanns. Negative mcmurrays, apleys, patellar apprehension. NV intact distally.  Left knee: FROM without pain.   Assessment & Plan:  1. Right knee pain - 2/2 DJD.  S/p cortisone without much benefit.  Discussed tylenol, topical medications, some supplements that may help.  Second supartz injection given today - f/u in 1 week for third injection.  Shown home exercises to do daily.  Continue use of cane.  Heat/ice.  After informed written consent, patient was seated in chair in exam room. Right knee was  prepped with alcohol swab and utilizing anteromedial approach, patient's right knee was injected intraarticularly with 3mL bupivicaine followed by supartz. Patient tolerated the procedure well without immediate complications.

## 2016-05-27 NOTE — Assessment & Plan Note (Signed)
S/p cortisone without much benefit.  Discussed tylenol, topical medications, some supplements that may help.  Second supartz injection given today - f/u in 1 week for third injection.  Shown home exercises to do daily.  Continue use of cane.  Heat/ice.  After informed written consent, patient was seated on exam table. Right knee was prepped with alcohol swab and utilizing anteromedial approach, patient's right knee was injected intraarticularly with 3mL bupivicaine followed by supartz depomedrol. Patient tolerated the procedure well without immediate complications.

## 2016-06-03 ENCOUNTER — Ambulatory Visit (INDEPENDENT_AMBULATORY_CARE_PROVIDER_SITE_OTHER): Payer: Medicare Other | Admitting: Family Medicine

## 2016-06-03 ENCOUNTER — Encounter: Payer: Self-pay | Admitting: Family Medicine

## 2016-06-03 VITALS — BP 115/57 | HR 57 | Ht 60.0 in | Wt 135.0 lb

## 2016-06-03 DIAGNOSIS — M1711 Unilateral primary osteoarthritis, right knee: Secondary | ICD-10-CM

## 2016-06-03 DIAGNOSIS — M79672 Pain in left foot: Secondary | ICD-10-CM

## 2016-06-03 DIAGNOSIS — M79671 Pain in right foot: Secondary | ICD-10-CM

## 2016-06-03 MED ORDER — SODIUM HYALURONATE (VISCOSUP) 25 MG/2.5ML IX SOSY
2.5000 mL | PREFILLED_SYRINGE | Freq: Once | INTRA_ARTICULAR | Status: AC
  Administered 2016-06-03: 2.5 mL via INTRA_ARTICULAR

## 2016-06-03 NOTE — Patient Instructions (Signed)
Call me in 4 weeks to let me know how your knee is doing. We will refer you to our podiatrist (Dr. Ardelle AntonWagoner) for your toes - he comes here on tuesdays.

## 2016-06-06 NOTE — Assessment & Plan Note (Signed)
S/p cortisone without much benefit.  Discussed tylenol, topical medications, some supplements that may help.  Third supartz injection given today - Call us in 4 weeks for an update on her status.  Shown home exercises to do daily.  Continue use of cane.  Heat/ice.  After informed written consent, patient was seated in chair of exam room. Right knee was prepped with alcohol swab and utilizing anteromedial approach, patient's right knee was injected intraarticularly with 3mL bupivicaine followed by supartz. Patient tolerated the procedure well without immediate complications.

## 2016-06-06 NOTE — Progress Notes (Signed)
PCP and consultation requested by: [name], MD  Subjective:   HPI: Patient is a 83 y.o. female here for right knee pain.  2/9: Patient reports she's had several year history of right knee pain. Worse with walking. Describes pain as sharp, 5/10 level, anterior. Using a brace, cane. Taking tylenol. Had cortisone shot before Christmas. Has known arthritis of this knee confirmed on radiographs and CT scan. No skin changes, numbness.  2/16: Patient returns for second supartz injection. Pain level 5/10, soreness anteriorly. No skin changes, numbness.  2/23: Patient reports she feels like she's improving. A lot of soreness better. Pain still 5/10 today however. No skin changes, numbness.  Past Medical History:  Diagnosis Date  . Anemia   . Chronic renal insufficiency   . Diabetes mellitus   . Hard of hearing   . Hyperlipidemia   . Hypertension   . Osteoarthritis   . Syphilis    ?latent?, treated c/ PCN 1993    Current Outpatient Prescriptions on File Prior to Visit  Medication Sig Dispense Refill  . acetaminophen (TYLENOL) 500 MG tablet Take 500 mg by mouth every 6 (six) hours as needed.      Marland Kitchen aspirin 81 MG tablet Take 81 mg by mouth daily.      . cloNIDine (CATAPRES) 0.1 MG tablet Take 1 tablet (0.1 mg total) by mouth 2 (two) times daily. 180 tablet 2  . furosemide (LASIX) 20 MG tablet Take 1 tablet (20 mg total) by mouth daily. 30 tablet 5  . glimepiride (AMARYL) 2 MG tablet Take 1 tablet (2 mg total) by mouth daily with breakfast. 90 tablet 1  . glucose blood (RELION GLUCOSE TEST STRIPS) test strip Check no more than twice daily. 100 each 12  . RELION ULTRA THIN PLUS LANCETS MISC Check no more than twice daily 100 each 12  . simvastatin (ZOCOR) 40 MG tablet Take 1 tablet (40 mg total) by mouth at bedtime. 90 tablet 1  . sitaGLIPtin (JANUVIA) 50 MG tablet Take 1 tablet (50 mg total) by mouth daily. 90 tablet 1  . trolamine salicylate (ASPERCREME) 10 % cream Apply  topically as needed. 85 g 0  . vitamin B-12 (CYANOCOBALAMIN) 100 MCG tablet Take 100 mcg by mouth daily.      No current facility-administered medications on file prior to visit.     Past Surgical History:  Procedure Laterality Date  . ABDOMINAL HYSTERECTOMY     BSO  . CATARACT EXTRACTION    . cochlear impalnt  1/02  . NEPHRECTOMY     left  . OOPHORECTOMY      Allergies  Allergen Reactions  . Amlodipine Besylate Nausea Only  . Lisinopril Other (See Comments)    REACTION: hyperkalemia    Social History   Social History  . Marital status: Single    Spouse name: N/A  . Number of children: 5  . Years of education: N/A   Occupational History  . PT job   .  Retired   Social History Main Topics  . Smoking status: Former Games developer  . Smokeless tobacco: Never Used  . Alcohol use No  . Drug use: No  . Sexual activity: No   Other Topics Concern  . Not on file   Social History Narrative   Son lives with her, still drives to work.   Her daughter Kirt Boys [phone])  lives 20 minutes away and usually comes w/ the pt to the office visit    Family History  Problem Relation Age of Onset  . CAD Neg Hx     BP (!) 115/57   Pulse (!) 57   Ht 5' (1.524 m)   Wt 135 lb (61.2 kg)   BMI 26.37 kg/m   Review of Systems: See HPI above.     Objective:  Physical Exam:  Gen: NAD, comfortable in exam room  Exam not repeated today. Right knee: No gross deformity, ecchymoses, effusion. TTP medial joint line > lateral.  No other tenderness. FROM. Negative ant/post drawers. Negative valgus/varus testing. Negative lachmanns. Negative mcmurrays, apleys, patellar apprehension. NV intact distally.  Left knee: FROM without pain.   Assessment & Plan:  1. Right knee pain - 2/2 DJD.  S/p cortisone without much benefit.  Discussed tylenol, topical medications, some supplements that may help.  Third supartz injection given today - Call us in 4 weeks for an update on her status.   Shown home exercises to do daily.  Continue use of cane.  Heat/ice.  After informed written consent, patient was seated in chair of exam room. Right knee was prepped with alcohol swab and utilizing anteromedial approach, patient's right knee was injected intraarticularly with 3mL bupivicaine followed by supartz. Patient tolerated the procedure well without immediate complications.

## 2016-06-08 NOTE — Addendum Note (Signed)
Addended by: Kathi SimpersWISE, Dorathea Faerber F on: 06/08/2016 12:27 PM   Modules accepted: Orders

## 2016-06-20 ENCOUNTER — Other Ambulatory Visit: Payer: Self-pay | Admitting: Internal Medicine

## 2016-06-21 ENCOUNTER — Encounter: Payer: Self-pay | Admitting: Podiatry

## 2016-06-21 ENCOUNTER — Ambulatory Visit (INDEPENDENT_AMBULATORY_CARE_PROVIDER_SITE_OTHER): Payer: Medicare Other | Admitting: Podiatry

## 2016-06-21 DIAGNOSIS — M79674 Pain in right toe(s): Secondary | ICD-10-CM | POA: Diagnosis not present

## 2016-06-21 DIAGNOSIS — B351 Tinea unguium: Secondary | ICD-10-CM

## 2016-06-21 DIAGNOSIS — L600 Ingrowing nail: Secondary | ICD-10-CM

## 2016-06-21 DIAGNOSIS — M79675 Pain in left toe(s): Secondary | ICD-10-CM | POA: Diagnosis not present

## 2016-06-21 NOTE — Progress Notes (Signed)
   Subjective:    Patient ID: Jeanne Barker, female    DOB: 05/29/1923, 83 y.o.   MRN: [id number]  HPI  83 year old female presents the office they with her daughter for concerns of both of her big toenails become ingrown which are painful with pressure in shoes. She denies any redness r drainage or any swelling to the toenails. Sh's had no recent treatment for this. No recent injury. No other complaints at this time.   Review of Systems  All other systems reviewed and are negative.      Objective:   Physical Exam General: AAO x3, NAD  Dermatological: nails appear to be dystrophic, discolored, hypertrophic to the nails most of bilateral hallux. Bilateral hallux nail is incurvation both the medial and lateral nail borders there is pain to the distal portion the nails. There is no pain of the proximal nail borders. There is no swelling redness or drainage or any signs of infection. There is no open sores identified.  Vascular: Dorsalis Pedis artery and Posterior Tibial artery pedal pulses are 1/4 bilateral with immedate capillary fill time. There is no pain with calf compression, swelling, warmth, erythema.   Neruologic: Grossly intact via light touch bilateral. Protective threshold with Semmes Wienstein monofilament intact to all pedal sites bilateral.   Musculoskeletal: No gross boney pedal deformities bilateral. No pain, crepitus, or limitation noted with foot and ankle range of motion bilateral. Muscular strength 5/5 in all groups tested bilateral.  Gait: Unassisted, Nonantalgic.     Assessment & Plan:  83 year old female bilateral hallux ingrown toenail, onychomycosis -Treatment options discussed including all alternatives, risks, and complications -Etiology of symptoms were discussed -Bilateral hallux toe nails were debrided to remove the symptomatic portion of ingrown toenails without, complications or bleeding. -Daily foot inspection -RTC 3 months or sooner if needed.  Ovid CurdMatthew  Wagoner, DPM

## 2016-07-07 ENCOUNTER — Ambulatory Visit (INDEPENDENT_AMBULATORY_CARE_PROVIDER_SITE_OTHER): Payer: Medicare Other | Admitting: Family Medicine

## 2016-07-07 ENCOUNTER — Encounter: Payer: Self-pay | Admitting: Family Medicine

## 2016-07-07 VITALS — BP 98/46 | HR 57 | Ht 60.0 in | Wt 136.0 lb

## 2016-07-07 DIAGNOSIS — M1711 Unilateral primary osteoarthritis, right knee: Secondary | ICD-10-CM

## 2016-07-07 MED ORDER — SODIUM HYALURONATE (VISCOSUP) 25 MG/2.5ML IX SOSY
2.5000 mL | PREFILLED_SYRINGE | Freq: Once | INTRA_ARTICULAR | Status: AC
  Administered 2016-07-07: 2.5 mL via INTRA_ARTICULAR

## 2016-07-08 NOTE — Progress Notes (Signed)
PCP and consultation requested by: Jeanne Ora, MD  Subjective:   HPI: Patient is a 83 y.o. female here for right knee pain.  2/9: Patient reports she's had several year history of right knee pain. Worse with walking. Describes pain as sharp, 5/10 level, anterior. Using a brace, cane. Taking tylenol. Had cortisone shot before Christmas. Has known arthritis of this knee confirmed on radiographs and CT scan. No skin changes, numbness.  2/16: Patient returns for second supartz injection. Pain level 5/10, soreness anteriorly. No skin changes, numbness.  2/23: Patient reports she feels like she's improving. A lot of soreness better. Pain still 5/10 today however. No skin changes, numbness.  3/29: Knee injections have helped patient but continues to have stiffness, soreness in right knee anteriorly. Would like to go ahead with 4th and 5th supartz injections. No skin changes, numbness.  Past Medical History:  Diagnosis Date  . Anemia   . Chronic renal insufficiency   . Diabetes mellitus   . Hard of hearing   . Hyperlipidemia   . Hypertension   . Osteoarthritis   . Syphilis    ?latent?, treated c/ PCN 1993    Current Outpatient Prescriptions on File Prior to Visit  Medication Sig Dispense Refill  . acetaminophen (TYLENOL) 500 MG tablet Take 500 mg by mouth every 6 (six) hours as needed.      Marland Kitchen aspirin 81 MG tablet Take 81 mg by mouth daily.      . cloNIDine (CATAPRES) 0.1 MG tablet Take 1 tablet (0.1 mg total) by mouth 2 (two) times daily. 180 tablet 2  . furosemide (LASIX) 20 MG tablet Take 1 tablet (20 mg total) by mouth daily. 30 tablet 5  . glimepiride (AMARYL) 2 MG tablet Take 1 tablet (2 mg total) by mouth daily with breakfast. 90 tablet 1  . glucose blood (RELION GLUCOSE TEST STRIPS) test strip Check no more than twice daily. 100 each 12  . RELION ULTRA THIN PLUS LANCETS MISC Check no more than twice daily 100 each 12  . simvastatin (ZOCOR) 40 MG tablet Take 1  tablet (40 mg total) by mouth at bedtime. 90 tablet 1  . sitaGLIPtin (JANUVIA) 50 MG tablet Take 1 tablet (50 mg total) by mouth daily. 90 tablet 1  . trolamine salicylate (ASPERCREME) 10 % cream Apply topically as needed. 85 g 0  . vitamin B-12 (CYANOCOBALAMIN) 100 MCG tablet Take 100 mcg by mouth daily.      No current facility-administered medications on file prior to visit.     Past Surgical History:  Procedure Laterality Date  . ABDOMINAL HYSTERECTOMY     BSO  . CATARACT EXTRACTION    . cochlear impalnt  1/02  . NEPHRECTOMY     left  . OOPHORECTOMY      Allergies  Allergen Reactions  . Amlodipine Besylate Nausea Only  . Lisinopril Other (See Comments)    REACTION: hyperkalemia    Social History   Social History  . Marital status: Single    Spouse name: N/A  . Number of children: 5  . Years of education: N/A   Occupational History  . PT job   .  Retired   Social History Main Topics  . Smoking status: Former Games developer  . Smokeless tobacco: Never Used  . Alcohol use No  . Drug use: No  . Sexual activity: No   Other Topics Concern  . Not on file   Social History Narrative   Son lives with her,  still drives to work.   Her daughter Kirt Boys [phone])  lives 20 minutes away and usually comes w/ the pt to the office visit    Family History  Problem Relation Age of Onset  . CAD Neg Hx     BP (!) 98/46   Pulse (!) 57   Ht 5' (1.524 m)   Wt 136 lb (61.7 kg)   BMI 26.56 kg/m   Review of Systems: See HPI above.     Objective:  Physical Exam:  Gen: NAD, comfortable in exam room  Exam not repeated today. Right knee: No gross deformity, ecchymoses, effusion. TTP medial joint line > lateral.  No other tenderness. FROM. Negative ant/post drawers. Negative valgus/varus testing. Negative lachmanns. Negative mcmurrays, apleys, patellar apprehension. NV intact distally.  Left knee: FROM without pain.   Assessment & Plan:  1. Right knee pain - 2/2  DJD.  S/p cortisone without much benefit.  Discussed tylenol, topical medications, some supplements that may help.  Fourth supartz injection given today - f/u in 1 week for 5th injection.  Home exercises, cane.  Heat/ice.  After informed written consent, patient was seated in chair of exam room. Right knee was prepped with alcohol swab and utilizing anteromedial approach, patient's right knee was injected intraarticularly with 3mL bupivicaine followed by supartz. Patient tolerated the procedure well without immediate complications.

## 2016-07-08 NOTE — Assessment & Plan Note (Signed)
S/p cortisone without much benefit.  Discussed tylenol, topical medications, some supplements that may help.  Fourth supartz injection given today - f/u in 1 week for 5th injection.  Home exercises, cane.  Heat/ice.  After informed written consent, patient was seated in chair of exam room. Right knee was prepped with alcohol swab and utilizing anteromedial approach, patient's right knee was injected intraarticularly with 3mL bupivicaine followed by supartz. Patient tolerated the procedure well without immediate complications.

## 2016-07-14 ENCOUNTER — Ambulatory Visit: Payer: Medicare Other | Admitting: Family Medicine

## 2016-07-15 ENCOUNTER — Ambulatory Visit: Payer: Medicare Other | Admitting: Family Medicine

## 2016-07-19 ENCOUNTER — Other Ambulatory Visit: Payer: Self-pay | Admitting: Internal Medicine

## 2016-07-21 DIAGNOSIS — E161 Other hypoglycemia: Secondary | ICD-10-CM | POA: Diagnosis not present

## 2016-08-06 ENCOUNTER — Observation Stay (HOSPITAL_COMMUNITY)
Admission: EM | Admit: 2016-08-06 | Discharge: 2016-08-07 | Disposition: A | Discharge status: Home / Self Care | Payer: Medicare Other | Attending: Internal Medicine | Admitting: Internal Medicine

## 2016-08-06 ENCOUNTER — Emergency Department (HOSPITAL_COMMUNITY): Payer: Medicare Other

## 2016-08-06 ENCOUNTER — Encounter (HOSPITAL_COMMUNITY): Payer: Self-pay | Admitting: *Deleted

## 2016-08-06 DIAGNOSIS — R918 Other nonspecific abnormal finding of lung field: Secondary | ICD-10-CM | POA: Diagnosis not present

## 2016-08-06 DIAGNOSIS — Z7982 Long term (current) use of aspirin: Secondary | ICD-10-CM | POA: Diagnosis not present

## 2016-08-06 DIAGNOSIS — D649 Anemia, unspecified: Secondary | ICD-10-CM | POA: Diagnosis not present

## 2016-08-06 DIAGNOSIS — E119 Type 2 diabetes mellitus without complications: Secondary | ICD-10-CM

## 2016-08-06 DIAGNOSIS — R4182 Altered mental status, unspecified: Secondary | ICD-10-CM | POA: Diagnosis not present

## 2016-08-06 DIAGNOSIS — Z79899 Other long term (current) drug therapy: Secondary | ICD-10-CM | POA: Insufficient documentation

## 2016-08-06 DIAGNOSIS — E785 Hyperlipidemia, unspecified: Secondary | ICD-10-CM | POA: Diagnosis not present

## 2016-08-06 DIAGNOSIS — R404 Transient alteration of awareness: Secondary | ICD-10-CM | POA: Diagnosis not present

## 2016-08-06 DIAGNOSIS — Z87891 Personal history of nicotine dependence: Secondary | ICD-10-CM | POA: Insufficient documentation

## 2016-08-06 DIAGNOSIS — E871 Hypo-osmolality and hyponatremia: Secondary | ICD-10-CM | POA: Diagnosis present

## 2016-08-06 DIAGNOSIS — R8290 Unspecified abnormal findings in urine: Secondary | ICD-10-CM | POA: Diagnosis present

## 2016-08-06 DIAGNOSIS — N390 Urinary tract infection, site not specified: Secondary | ICD-10-CM | POA: Diagnosis present

## 2016-08-06 DIAGNOSIS — Z7984 Long term (current) use of oral hypoglycemic drugs: Secondary | ICD-10-CM | POA: Diagnosis not present

## 2016-08-06 DIAGNOSIS — G9340 Encephalopathy, unspecified: Secondary | ICD-10-CM | POA: Diagnosis not present

## 2016-08-06 DIAGNOSIS — E1122 Type 2 diabetes mellitus with diabetic chronic kidney disease: Secondary | ICD-10-CM | POA: Insufficient documentation

## 2016-08-06 DIAGNOSIS — Z905 Acquired absence of kidney: Secondary | ICD-10-CM | POA: Diagnosis not present

## 2016-08-06 DIAGNOSIS — M1711 Unilateral primary osteoarthritis, right knee: Secondary | ICD-10-CM | POA: Diagnosis not present

## 2016-08-06 DIAGNOSIS — I6789 Other cerebrovascular disease: Secondary | ICD-10-CM | POA: Diagnosis not present

## 2016-08-06 DIAGNOSIS — N184 Chronic kidney disease, stage 4 (severe): Secondary | ICD-10-CM | POA: Diagnosis present

## 2016-08-06 DIAGNOSIS — I10 Essential (primary) hypertension: Secondary | ICD-10-CM | POA: Diagnosis present

## 2016-08-06 LAB — BASIC METABOLIC PANEL
ANION GAP: 10 (ref 5–15)
Anion gap: 7 (ref 5–15)
BUN: 22 mg/dL — ABNORMAL HIGH (ref 6–20)
BUN: 21 mg/dL — ABNORMAL HIGH (ref 6–20)
CALCIUM: 9.2 mg/dL (ref 8.9–10.3)
CHLORIDE: 97 mmol/L — AB (ref 101–111)
CHLORIDE: 100 mmol/L — AB (ref 101–111)
CO2: 23 mmol/L (ref 22–32)
CO2: 22 mmol/L (ref 22–32)
CREATININE: 1.56 mg/dL — AB (ref 0.44–1.00)
Calcium: 8.5 mg/dL — ABNORMAL LOW (ref 8.9–10.3)
Creatinine, Ser: 1.48 mg/dL — ABNORMAL HIGH (ref 0.44–1.00)
GFR calc non Af Amer: 28 mL/min — ABNORMAL LOW (ref >60)
GFR calc non Af Amer: 30 mL/min — ABNORMAL LOW (ref >60)
GFR, EST AFRICAN AMERICAN: 33 mL/min — AB (ref >60)
GFR, EST AFRICAN AMERICAN: 35 mL/min — AB (ref >60)
Glucose, Bld: 85 mg/dL (ref 65–99)
Glucose, Bld: 215 mg/dL — ABNORMAL HIGH (ref 65–99)
POTASSIUM: 4.3 mmol/L (ref 3.5–5.1)
Potassium: 5.2 mmol/L — ABNORMAL HIGH (ref 3.5–5.1)
SODIUM: 130 mmol/L — AB (ref 135–145)
SODIUM: 129 mmol/L — AB (ref 135–145)

## 2016-08-06 LAB — COMPREHENSIVE METABOLIC PANEL
ALT: 10 U/L — AB (ref 14–54)
AST: 18 U/L (ref 15–41)
Albumin: 3.1 g/dL — ABNORMAL LOW (ref 3.5–5.0)
Alkaline Phosphatase: 59 U/L (ref 38–126)
Anion gap: 9 (ref 5–15)
BUN: 22 mg/dL — AB (ref 6–20)
CALCIUM: 9.1 mg/dL (ref 8.9–10.3)
CHLORIDE: 97 mmol/L — AB (ref 101–111)
CO2: 24 mmol/L (ref 22–32)
CREATININE: 1.79 mg/dL — AB (ref 0.44–1.00)
GFR calc Af Amer: 28 mL/min — ABNORMAL LOW (ref >60)
GFR, EST NON AFRICAN AMERICAN: 24 mL/min — AB (ref >60)
Glucose, Bld: 261 mg/dL — ABNORMAL HIGH (ref 65–99)
Potassium: 5.1 mmol/L (ref 3.5–5.1)
Sodium: 130 mmol/L — ABNORMAL LOW (ref 135–145)
TOTAL PROTEIN: 5.6 g/dL — AB (ref 6.5–8.1)
Total Bilirubin: 0.3 mg/dL (ref 0.3–1.2)

## 2016-08-06 LAB — CBC
HEMATOCRIT: 30.1 % — AB (ref 36.0–46.0)
Hemoglobin: 9.9 g/dL — ABNORMAL LOW (ref 12.0–15.0)
MCH: 27.4 pg (ref 26.0–34.0)
MCHC: 32.9 g/dL (ref 30.0–36.0)
MCV: 83.4 fL (ref 78.0–100.0)
Platelets: 254 10*3/uL (ref 150–400)
RBC: 3.61 MIL/uL — ABNORMAL LOW (ref 3.87–5.11)
RDW: 13.1 % (ref 11.5–15.5)
WBC: 5.6 10*3/uL (ref 4.0–10.5)

## 2016-08-06 LAB — CBG MONITORING, ED
GLUCOSE-CAPILLARY: 210 mg/dL — AB (ref 65–99)
GLUCOSE-CAPILLARY: 94 mg/dL (ref 65–99)

## 2016-08-06 LAB — DIFFERENTIAL
BASOS PCT: 0 %
Basophils Absolute: 0 10*3/uL (ref 0.0–0.1)
Eosinophils Absolute: 0.1 10*3/uL (ref 0.0–0.7)
Eosinophils Relative: 3 %
LYMPHS ABS: 1.4 10*3/uL (ref 0.7–4.0)
Lymphocytes Relative: 25 %
Monocytes Absolute: 0.8 10*3/uL (ref 0.1–1.0)
Monocytes Relative: 13 %
Neutro Abs: 3.3 10*3/uL (ref 1.7–7.7)
Neutrophils Relative %: 59 %

## 2016-08-06 LAB — URINALYSIS, ROUTINE W REFLEX MICROSCOPIC
BILIRUBIN URINE: NEGATIVE
Glucose, UA: 50 mg/dL — AB
Hgb urine dipstick: NEGATIVE
Ketones, ur: NEGATIVE mg/dL
Nitrite: NEGATIVE
PH: 6 (ref 5.0–8.0)
Protein, ur: NEGATIVE mg/dL
SPECIFIC GRAVITY, URINE: 1.005 (ref 1.005–1.030)

## 2016-08-06 LAB — APTT: aPTT: 30 seconds (ref 24–36)

## 2016-08-06 LAB — PROTIME-INR
INR: 1.05
PROTHROMBIN TIME: 13.7 s (ref 11.4–15.2)

## 2016-08-06 LAB — I-STAT TROPONIN, ED: TROPONIN I, POC: 0 ng/mL (ref 0.00–0.08)

## 2016-08-06 LAB — TROPONIN I: TROPONIN I: 0.03 ng/mL — AB (ref <0.03)

## 2016-08-06 MED ORDER — ENOXAPARIN SODIUM 30 MG/0.3ML ~~LOC~~ SOLN
30.0000 mg | SUBCUTANEOUS | Status: DC
  Administered 2016-08-06: 30 mg via SUBCUTANEOUS
  Filled 2016-08-06: qty 0.3

## 2016-08-06 MED ORDER — SIMVASTATIN 40 MG PO TABS
40.0000 mg | ORAL_TABLET | Freq: Every day | ORAL | Status: DC
  Administered 2016-08-06: 40 mg via ORAL
  Filled 2016-08-06: qty 1

## 2016-08-06 MED ORDER — INSULIN ASPART 100 UNIT/ML ~~LOC~~ SOLN
0.0000 [IU] | SUBCUTANEOUS | Status: DC
  Administered 2016-08-06: 3 [IU] via SUBCUTANEOUS
  Filled 2016-08-06: qty 1

## 2016-08-06 MED ORDER — SODIUM CHLORIDE 0.9 % IV BOLUS (SEPSIS)
250.0000 mL | Freq: Once | INTRAVENOUS | Status: AC
  Administered 2016-08-06: 250 mL via INTRAVENOUS

## 2016-08-06 MED ORDER — ACETAMINOPHEN 650 MG RE SUPP: 650.0000 mg | RECTAL | Status: DC | PRN

## 2016-08-06 MED ORDER — DEXTROSE 5 % IV SOLN
1.0000 g | INTRAVENOUS | Status: AC
  Administered 2016-08-06 – 2016-08-07 (×2): 1 g via INTRAVENOUS
  Filled 2016-08-06 (×2): qty 10

## 2016-08-06 MED ORDER — SODIUM CHLORIDE 0.9 % IV SOLN
INTRAVENOUS | Status: DC
  Administered 2016-08-06: 75 mL/h via INTRAVENOUS
  Administered 2016-08-07: 02:00:00 via INTRAVENOUS

## 2016-08-06 MED ORDER — STROKE: EARLY STAGES OF RECOVERY BOOK
Freq: Once | Status: AC
  Administered 2016-08-07: 04:00:00

## 2016-08-06 MED ORDER — ACETAMINOPHEN 325 MG PO TABS
650.0000 mg | ORAL_TABLET | ORAL | Status: DC | PRN
  Administered 2016-08-06: 650 mg via ORAL
  Filled 2016-08-06: qty 2

## 2016-08-06 MED ORDER — LINAGLIPTIN 5 MG PO TABS
5.0000 mg | ORAL_TABLET | Freq: Every day | ORAL | Status: DC
  Administered 2016-08-06 – 2016-08-07 (×2): 5 mg via ORAL
  Filled 2016-08-06 (×2): qty 1

## 2016-08-06 MED ORDER — GLIMEPIRIDE 4 MG PO TABS
2.0000 mg | ORAL_TABLET | Freq: Every day | ORAL | Status: DC
  Administered 2016-08-07: 2 mg via ORAL
  Filled 2016-08-06: qty 1

## 2016-08-06 MED ORDER — ASPIRIN 81 MG PO CHEW
81.0000 mg | CHEWABLE_TABLET | Freq: Every day | ORAL | Status: DC
  Administered 2016-08-06 – 2016-08-07 (×2): 81 mg via ORAL
  Filled 2016-08-06 (×3): qty 1

## 2016-08-06 MED ORDER — ACETAMINOPHEN 160 MG/5ML PO SOLN: 650.0000 mg | ORAL | Status: DC | PRN

## 2016-08-06 NOTE — Progress Notes (Signed)
Arrived from ED on stretcher; transferred off stretcher by staff to the bed; patient is alert; oriented to room and unit routine; family at bedside.

## 2016-08-06 NOTE — ED Triage Notes (Signed)
Daughter states she came to patients house at 0900, patient lives at home by yourself. Daughter states that when she got there patient was confused, with slurred speech, and weakness. States she dropped spoon. Patient did not take medication yesterday or this am. Patient with no deficiets at this time on exam, patient is hard of hearing requiring questions to be asked several times. Patients daughter states she is still not acting herself however.

## 2016-08-06 NOTE — H&P (Signed)
History and Physical    Jeanne Barker GNF:[id number] DOB: [birth year] DOA: 08/06/2016   PCP: [name], MD   Patient coming from/Resides with: Private residence/alone  Admission status: Observation/telemetry -it may be medically necessary to stay a minimum 2 midnights to rule out impending and/or unexpected changes in physiologic status that may differ from initial evaluation performed in the ER and/or at time of admission-consider reevaluation of admission status in 24 hours.   Chief Complaint: Confusion and slurred speech  HPI: Jeanne Barker is a very independent 83 y.o. female with medical history significant for diabetes mellitus 2 on oral medications, HLD, HTN, CKD 4, chronic R knee pain secondary to osteoarthritis presented to the ER after family noticed generalized weakness, confusion and slurred speech onset this morning. Patient was at her baseline mentation as of yesterday evening when family last saw her. They did not notice any focal neurological deficits such as facial drooping or focal weakness. She was unable to hold a spoon briefly. Family reports that since arriving to the ER she has returned to baseline. Patient appears somewhat dehydrated with a sodium of 130. Urinalysis also abnormal and is concerning for possible UTI. CBGs are also elevated greater than 250. CT head negative for acute process.  ED Course:  Vital Signs: BP 131/82   Pulse 62   Temp 98.1 F (36.7 C)   Resp 18   SpO2 100%  CT head without contrast: Neg 2 view CXR: Neg Lab Data: Sodium 130, potassium 5.1, chloride 97, CO2 24, glucose 261, anion gap 9, BUN 22, creatinine 1.79, albumin 3.1, poc troponin 0.00, white count 5600 with normal differential, hemoglobin 9.9, platelets 254,000; urinalysis abnormal with hazy appearance, many bacteria, 50 glucose, small leukocytes, nitrite negative, WBCs 6-30 Medications and treatments: Normal saline bolus 250 mL  Review of Systems:  In addition to the HPI above,  No  Fever-chills, myalgias or other constitutional symptoms No Headache, changes with Vision or hearing, new focal weakness, tingling, numbness in any extremity, dizziness, dysarthria or word finding difficulty only slurred speech, gait disturbance or imbalance only generalized weakness, tremors or seizure activity No problems swallowing food or Liquids, indigestion/reflux, choking or coughing while eating, abdominal pain with or after eating No Chest pain, Cough or Shortness of Breath, palpitations, orthopnea or DOE No Abdominal pain, N/V, melena,hematochezia, dark tarry stools, constipation No dysuria, malodorous urine, hematuria or flank pain No new skin rashes, lesions, masses or bruises, No new joint pains, aches, swelling or redness No recent unintentional weight gain or loss No polyuria, polydypsia or polyphagia   Past Medical History:  Diagnosis Date  . Anemia   . Chronic renal insufficiency   . Diabetes mellitus   . Hard of hearing   . Hyperlipidemia   . Hypertension   . Osteoarthritis   . Syphilis    ?latent?, treated c/ PCN 1993    Past Surgical History:  Procedure Laterality Date  . ABDOMINAL HYSTERECTOMY     BSO  . CATARACT EXTRACTION    . cochlear impalnt  1/02  . NEPHRECTOMY     left  . OOPHORECTOMY      Social History   Social History  . Marital status: Single    Spouse name: N/A  . Number of children: 5  . Years of education: N/A   Occupational History  . PT job   .  Retired   Social History Main Topics  . Smoking status: Former Games developer  . Smokeless tobacco: Never Used  .  Alcohol use No  . Drug use: No  . Sexual activity: No   Other Topics Concern  . Not on file   Social History Narrative   Son lives with her, still drives to work.   Her daughter Jeanne Barker [phone])  lives 20 minutes away and usually comes w/ the pt to the office visit    Mobility: Utilizes cane Work history: Not obtained   Allergies  Allergen Reactions  . Amlodipine  Besylate Nausea Only  . Lisinopril Other (See Comments)    REACTION: hyperkalemia    Family History  Problem Relation Age of Onset  . CAD Neg Hx      Prior to Admission medications   Medication Sig Start Date End Date Taking? Authorizing Provider  acetaminophen (TYLENOL) 500 MG tablet Take 500 mg by mouth every 6 (six) hours as needed.      Historical Provider, MD  aspirin 81 MG tablet Take 81 mg by mouth daily.      Historical Provider, MD  cloNIDine (CATAPRES) 0.1 MG tablet Take 1 tablet (0.1 mg total) by mouth 2 (two) times daily. 07/19/16   [name], MD  furosemide (LASIX) 20 MG tablet Take 1 tablet (20 mg total) by mouth daily. 05/09/16   [name], MD  glimepiride (AMARYL) 2 MG tablet Take 1 tablet (2 mg total) by mouth daily with breakfast. 04/12/16   [name], MD  glucose blood (RELION GLUCOSE TEST STRIPS) test strip Check no more than twice daily. 06/30/14   [name], MD  RELION ULTRA THIN PLUS LANCETS MISC Check no more than twice daily 06/30/14   [name], MD  simvastatin (ZOCOR) 40 MG tablet Take 1 tablet (40 mg total) by mouth at bedtime. 03/09/16   [name], MD  sitaGLIPtin (JANUVIA) 50 MG tablet Take 1 tablet (50 mg total) by mouth daily. 06/20/16   [name], MD  trolamine salicylate (ASPERCREME) 10 % cream Apply topically as needed. 12/06/12   Kelle Darting, NP  vitamin B-12 (CYANOCOBALAMIN) 100 MCG tablet Take 100 mcg by mouth daily.     Historical Provider, MD    Physical Exam: Vitals:   08/06/16 1115 08/06/16 1130 08/06/16 1145 08/06/16 1150  BP: (!) 132/58 (!) 145/61 131/82   Pulse: 64 66 62   Resp: 18 (!) 21 18   Temp:    98.1 F (36.7 C)  TempSrc:      SpO2: 98% 100% 100%       Constitutional: NAD, calm, comfortable Eyes: PERRL, lids and conjunctivae normal ENMT: Mucous membranes are moist. Posterior pharynx clear of any exudate or lesions. Edentulous Neck: normal, supple, no masses, no thyromegaly Respiratory: clear to auscultation bilaterally,  no wheezing, no crackles. Normal respiratory effort. No accessory muscle use.  Cardiovascular: Regular rate and rhythm, no murmurs / rubs / gallops. No extremity edema. 2+ pedal pulses. No carotid bruits.  Abdomen: no tenderness, no masses palpated. No hepatosplenomegaly. Bowel sounds positive.  Musculoskeletal: no clubbing / cyanosis. No joint deformity upper and lower extremities. Good ROM, no contractures. Normal muscle tone.  Skin: no rashes, lesions, ulcers. No induration Neurologic: CN 2-12 grossly intact except for known chronic hearing loss and has cochlear implant in place. Sensation intact, DTR normal. Strength 5/5 x all 4 extremities.  Psychiatric: Normal judgment and insight. Alert and oriented x 3. Normal mood.    Labs on Admission: I have personally reviewed following labs and imaging studies  CBC:  Recent Labs Lab 08/06/16 1013  WBC 5.6  NEUTROABS 3.3  HGB 9.9*  HCT 30.1*  MCV 83.4  PLT 254   Basic Metabolic Panel:  Recent Labs Lab 08/06/16 1013  NA 130*  K 5.1  CL 97*  CO2 24  GLUCOSE 261*  BUN 22*  CREATININE 1.79*  CALCIUM 9.1   GFR: CrCl cannot be calculated (Unknown ideal weight.). Liver Function Tests:  Recent Labs Lab 08/06/16 1013  AST 18  ALT 10*  ALKPHOS 59  BILITOT 0.3  PROT 5.6*  ALBUMIN 3.1*   No results for input(s): LIPASE, AMYLASE in the last 168 hours. No results for input(s): AMMONIA in the last 168 hours. Coagulation Profile:  Recent Labs Lab 08/06/16 1013  INR 1.05   Cardiac Enzymes: No results for input(s): CKTOTAL, CKMB, CKMBINDEX, TROPONINI in the last 168 hours. BNP (last 3 results) No results for input(s): PROBNP in the last 8760 hours. HbA1C: No results for input(s): HGBA1C in the last 72 hours. CBG:  Recent Labs Lab 08/06/16 1253  GLUCAP 210*   Lipid Profile: No results for input(s): CHOL, HDL, LDLCALC, TRIG, CHOLHDL, LDLDIRECT in the last 72 hours. Thyroid Function Tests: No results for input(s):  TSH, T4TOTAL, FREET4, T3FREE, THYROIDAB in the last 72 hours. Anemia Panel: No results for input(s): VITAMINB12, FOLATE, FERRITIN, TIBC, IRON, RETICCTPCT in the last 72 hours. Urine analysis:    Component Value Date/Time   COLORURINE YELLOW 08/06/2016 1200   APPEARANCEUR HAZY (A) 08/06/2016 1200   LABSPEC 1.005 08/06/2016 1200   PHURINE 6.0 08/06/2016 1200   GLUCOSEU 50 (A) 08/06/2016 1200   HGBUR NEGATIVE 08/06/2016 1200   BILIRUBINUR NEGATIVE 08/06/2016 1200   KETONESUR NEGATIVE 08/06/2016 1200   PROTEINUR NEGATIVE 08/06/2016 1200   UROBILINOGEN 0.2 06/18/2014 2200   NITRITE NEGATIVE 08/06/2016 1200   LEUKOCYTESUR SMALL (A) 08/06/2016 1200   Sepsis Labs: (procalcitonin:4,lacticidven:4) )No results found for this or any previous visit (from the past 240 hour(s)).   Radiological Exams on Admission: Dg Chest 2 View  Result Date: 08/06/2016 CLINICAL DATA:  Altered mental status. EXAM: CHEST  2 VIEW COMPARISON:  06/18/2014 FINDINGS: There is mild bilateral interstitial thickening likely chronic. There is no focal parenchymal opacity. There is no pleural effusion or pneumothorax. The heart and mediastinal contours are unremarkable. The osseous structures are unremarkable. IMPRESSION: No active cardiopulmonary disease. Electronically Signed   By: Elige Ko   On: 08/06/2016 11:42   Ct Head Wo Contrast  Result Date: 08/06/2016 CLINICAL DATA:  Altered mental status. Last seen normal at 1:30 p.m. yesterday. History of diabetes, hyperlipidemia, syphilis, hypertension, and chronic renal insufficiency. EXAM: CT HEAD WITHOUT CONTRAST TECHNIQUE: Contiguous axial images were obtained from the base of the skull through the vertex without intravenous contrast. COMPARISON:  CT head 08/29/2003. FINDINGS: Brain: No evidence for acute infarction, hemorrhage, mass lesion, hydrocephalus, or extra-axial fluid. Advanced atrophy, not unexpected for age. Hypoattenuation of white matter consistent  with small vessel disease. Significant obscuration of portions of the RIGHT hemisphere due to a RIGHT cochlear implant with beam attenuation. Vascular: No hyperdense vessel or unexpected calcification. Skull: Normal. Negative for fracture or focal lesion. Sinuses/Orbits: No acute finding. Other: Status post mastoidectomy on the RIGHT. Status post RIGHT cochlear implant. Compared with 2005, some progression of atrophy. IMPRESSION: Atrophy and small vessel disease. No acute intracranial findings. Limitation of interpretation due to beam hardening from cochlear implant. Electronically Signed   By: Elsie Stain M.D.   On: 08/06/2016 10:58  EKG: (Independently reviewed) sinus rhythm with ventricular rate 61 bpm, QTC 441 ms, no ischemic changes  Assessment/Plan Principal Problem:   Acute encephalopathy -Patient presents with abrupt onset of generalized weakness, mild confusion and reported slurred speech as well as difficulty managing an earlier. Family now reports patient back to baseline -Differential includes TIA versus metabolic etiology -CT head negative and reassuring -Unable to initially pursue MRI brain due to cochlear implant-family to bring in paperwork that will clarify if her implant safe to undergo MRI scan -Echocardiogram -Carotid duplex -Risk factor stratification: Hgb A1c/lipid panel -PT/OT/SLP evaluation -Frequent neurological checks -Continue home aspirin 81 mg -Treat any underlying metabolic causes -Mobilize with assistance -Possible infection and/or dehydration contributing-check orthostatic vital signs otherwise see below  Active Problems:   Abnormal urinalysis -Reflective of either UTI vs volume depletion state -Hold preadmission diuretic and antihypertensive medication since current blood pressure suboptimal -Obtain urine and blood cultures -Empiric Rocephin    DMII (diabetes mellitus, type 2)  -Currently not well controlled -SSI -Resume Amaryl and Januvia once  passes swallow screen -Given severity of CKD monitor for hypoglycemia closely with utilization of Amaryl    CKD (chronic kidney disease) stage 4, GFR 15-29 ml/min  -Current renal function stable and at baseline    Essential hypertension -Current blood pressure controlled and somewhat suboptimal -Holding Lasix and Catapres -Follow-up on echo -Unclear why utilizes Lasix    Anemia  -Hemoglobin in February was 11.5 and has been as low as 9.6 in 2016 -Current hemoglobin 9.9    Hyperlipidemia -Continue Zocor    Arthritis of right knee -Recent cortisone injection March 2018 -Patient reports continued symptoms -Above PT evaluation may be helpful      DVT prophylaxis: Lovenox dose adjusted for low GFR Code Status: Full Family Communication: Daughters and other family members at bedside Disposition Plan: Home Consults called: None    Laurann Mcmorris L. ANP-BC [facility] Pager [phone]   If 7PM-7AM, please contact night-coverage [email or web address] Password [email or web address]  08/06/2016, 1:00 PM

## 2016-08-06 NOTE — ED Notes (Signed)
Phlebotomy to draw labs.  

## 2016-08-06 NOTE — ED Notes (Signed)
Pt CBG was 210, notified Stacy(RN)

## 2016-08-06 NOTE — ED Provider Notes (Signed)
[facility]-EMERGENCY DEPT Provider Note   CSN: [id number] Arrival date & time: 08/06/16  4098     History   Chief Complaint Chief Complaint  Patient presents with  . Altered Mental Status    HPI Jeanne Barker is a 83 y.o. female.  The history is provided by the patient. No language interpreter was used.  Altered Mental Status     Jeanne Barker is a 83 y.o. female who presents to the Emergency Department complaining of AMS.  Level V caveat due to AMS.  History is provided by the family and the patient. Her daughters report that she has been not herself today. She was last seen normal at 1:30 yesterday. She lives at home alone but her daughters check on her regularly. He stated that when they found her today she had different speech and seemed confused. She appeared to be dropping things and she was trying to eat. No reports of injuries. It appears that she does not take her pills for Friday. No fevers, vomiting, diarrhea. No prior similar symptoms. Patient denies any complaints.  Past Medical History:  Diagnosis Date  . Anemia   . Chronic renal insufficiency   . Diabetes mellitus   . Hard of hearing   . Hyperlipidemia   . Hypertension   . Osteoarthritis   . Syphilis    ?latent?, treated c/ PCN 1993    Patient Active Problem List   Diagnosis Date Noted  . Hyponatremia 08/07/2016  . Acute encephalopathy 08/06/2016  . Abnormal urinalysis 08/06/2016  . Arthritis of right knee 05/25/2016  . UTI (urinary tract infection) 06/19/2014  . Essential hypertension 06/19/2014  . Hyperlipidemia 06/19/2014  . Well adult on routine health check 06/17/2013  . OSTEOARTHRITIS 02/18/2008  . CKD (chronic kidney disease) stage 4, GFR 15-29 ml/min (HCC) 10/12/2006  . DMII (diabetes mellitus, type 2) (HCC) 08/17/2006  . Anemia 08/17/2006    Past Surgical History:  Procedure Laterality Date  . ABDOMINAL HYSTERECTOMY     BSO  . CATARACT EXTRACTION    . cochlear impalnt  1/02  . NEPHRECTOMY      left  . OOPHORECTOMY      OB History    No data available       Home Medications    Prior to Admission medications   Medication Sig Start Date End Date Taking? Authorizing Provider  acetaminophen (TYLENOL) 500 MG tablet Take 500 mg by mouth every 6 (six) hours as needed for mild pain.    Yes Historical Provider, MD  aspirin 81 MG tablet Take 81 mg by mouth daily.     Yes Historical Provider, MD  cloNIDine (CATAPRES) 0.1 MG tablet Take 1 tablet (0.1 mg total) by mouth 2 (two) times daily. 07/19/16  Yes [name], MD  furosemide (LASIX) 20 MG tablet Take 1 tablet (20 mg total) by mouth daily. 05/09/16  Yes [name], MD  glimepiride (AMARYL) 2 MG tablet Take 1 tablet (2 mg total) by mouth daily with breakfast. 04/12/16  Yes [name], MD  glucose blood (RELION GLUCOSE TEST STRIPS) test strip Check no more than twice daily. 06/30/14  Yes [name], MD  RELION ULTRA THIN PLUS LANCETS MISC Check no more than twice daily 06/30/14  Yes [name], MD  simvastatin (ZOCOR) 40 MG tablet Take 1 tablet (40 mg total) by mouth at bedtime. 03/09/16  Yes [name], MD  sitaGLIPtin (JANUVIA) 50 MG tablet Take 1 tablet (50 mg  total) by mouth daily. 06/20/16  Yes [name], MD  vitamin B-12 (CYANOCOBALAMIN) 100 MCG tablet Take 100 mcg by mouth daily.    Yes Historical Provider, MD  cephALEXin (KEFLEX) 500 MG capsule Take 1 capsule (500 mg total) by mouth every 12 (twelve) hours. 08/07/16   Pearson Grippe, MD  trolamine salicylate (ASPERCREME) 10 % cream Apply topically as needed. Patient not taking: Reported on 08/06/2016 12/06/12   Kelle Darting, NP    Family History Family History  Problem Relation Age of Onset  . CAD Neg Hx     Social History Social History  Substance Use Topics  . Smoking status: Former Games developer  . Smokeless tobacco: Never Used  . Alcohol use No     Allergies   Amlodipine besylate and Lisinopril   Review of Systems Review of Systems  All other systems reviewed and are  negative.    Physical Exam Updated Vital Signs BP (!) 171/73 (BP Location: Left Arm)   Pulse 77   Temp 97.8 F (36.6 C) (Oral)   Resp 16   SpO2 100%   Physical Exam  Constitutional: She is oriented to person, place, and time. She appears well-developed and well-nourished.  HENT:  Head: Normocephalic and atraumatic.  Cardiovascular: Normal rate and regular rhythm.   No murmur heard. Pulmonary/Chest: Effort normal and breath sounds normal. No respiratory distress.  Abdominal: Soft. There is no tenderness. There is no rebound and no guarding.  Musculoskeletal: She exhibits no tenderness.  1+ pitting edema to BLE  Neurological: She is alert and oriented to person, place, and time.  Mildly dysarthric speech, may be secondary to being edentulous. No facial asymmetry. 5 out of 5 strength in all 4 extremities. Sensation light touch intact in all 4 extremities  Skin: Skin is warm and dry.  Psychiatric: She has a normal mood and affect. Her behavior is normal.  Nursing note and vitals reviewed.    ED Treatments / Results  Labs (all labs ordered are listed, but only abnormal results are displayed) Labs Reviewed  URINE CULTURE - Abnormal; Notable for the following:       Result Value   Culture >=100,000 COLONIES/mL GRAM NEGATIVE RODS (*)    All other components within normal limits  CBC - Abnormal; Notable for the following:    RBC 3.61 (*)    Hemoglobin 9.9 (*)    HCT 30.1 (*)    All other components within normal limits  COMPREHENSIVE METABOLIC PANEL - Abnormal; Notable for the following:    Sodium 130 (*)    Chloride 97 (*)    Glucose, Bld 261 (*)    BUN 22 (*)    Creatinine, Ser 1.79 (*)    Total Protein 5.6 (*)    Albumin 3.1 (*)    ALT 10 (*)    GFR calc non Af Amer 24 (*)    GFR calc Af Amer 28 (*)    All other components within normal limits  URINALYSIS, ROUTINE W REFLEX MICROSCOPIC - Abnormal; Notable for the following:    APPearance HAZY (*)    Glucose, UA 50  (*)    Leukocytes, UA SMALL (*)    Bacteria, UA MANY (*)    Squamous Epithelial / LPF 0-5 (*)    All other components within normal limits  BASIC METABOLIC PANEL - Abnormal; Notable for the following:    Sodium 130 (*)    Potassium 5.2 (*)    Chloride 97 (*)  BUN 22 (*)    Creatinine, Ser 1.56 (*)    GFR calc non Af Amer 28 (*)    GFR calc Af Amer 33 (*)    All other components within normal limits  BASIC METABOLIC PANEL - Abnormal; Notable for the following:    Sodium 129 (*)    Chloride 100 (*)    Glucose, Bld 215 (*)    BUN 21 (*)    Creatinine, Ser 1.48 (*)    Calcium 8.5 (*)    GFR calc non Af Amer 30 (*)    GFR calc Af Amer 35 (*)    All other components within normal limits  TROPONIN I - Abnormal; Notable for the following:    Troponin I 0.03 (*)    All other components within normal limits  BASIC METABOLIC PANEL - Abnormal; Notable for the following:    Sodium 133 (*)    Glucose, Bld 139 (*)    BUN 21 (*)    Creatinine, Ser 1.45 (*)    Calcium 8.8 (*)    GFR calc non Af Amer 31 (*)    GFR calc Af Amer 36 (*)    All other components within normal limits  GLUCOSE, CAPILLARY - Abnormal; Notable for the following:    Glucose-Capillary 157 (*)    All other components within normal limits  GLUCOSE, CAPILLARY - Abnormal; Notable for the following:    Glucose-Capillary 59 (*)    All other components within normal limits  GLUCOSE, CAPILLARY - Abnormal; Notable for the following:    Glucose-Capillary 121 (*)    All other components within normal limits  CBG MONITORING, ED - Abnormal; Notable for the following:    Glucose-Capillary 210 (*)    All other components within normal limits  CULTURE, BLOOD (ROUTINE X 2)  CULTURE, BLOOD (ROUTINE X 2)  PROTIME-INR  APTT  DIFFERENTIAL  LIPID PANEL  GLUCOSE, CAPILLARY  HEMOGLOBIN A1C  HEMOGLOBIN A1C  I-STAT TROPOININ, ED  CBG MONITORING, ED    EKG  EKG Interpretation  Date/Time:  Saturday August 06 2016 10:17:41  EDT Ventricular Rate:  61 PR Interval:    QRS Duration: 79 QT Interval:  437 QTC Calculation: 441 R Axis:   -6 Text Interpretation:  Sinus rhythm Low voltage, precordial leads Abnormal R-wave progression, early transition Confirmed by Lincoln Brigham [phone]) on 08/06/2016 11:15:44 AM       Radiology Dg Chest 2 View  Result Date: 08/06/2016 CLINICAL DATA:  Altered mental status. EXAM: CHEST  2 VIEW COMPARISON:  06/18/2014 FINDINGS: There is mild bilateral interstitial thickening likely chronic. There is no focal parenchymal opacity. There is no pleural effusion or pneumothorax. The heart and mediastinal contours are unremarkable. The osseous structures are unremarkable. IMPRESSION: No active cardiopulmonary disease. Electronically Signed   By: Elige Ko   On: 08/06/2016 11:42   Ct Head Wo Contrast  Result Date: 08/06/2016 CLINICAL DATA:  Altered mental status. Last seen normal at 1:30 p.m. yesterday. History of diabetes, hyperlipidemia, syphilis, hypertension, and chronic renal insufficiency. EXAM: CT HEAD WITHOUT CONTRAST TECHNIQUE: Contiguous axial images were obtained from the base of the skull through the vertex without intravenous contrast. COMPARISON:  CT head 08/29/2003. FINDINGS: Brain: No evidence for acute infarction, hemorrhage, mass lesion, hydrocephalus, or extra-axial fluid. Advanced atrophy, not unexpected for age. Hypoattenuation of white matter consistent with small vessel disease. Significant obscuration of portions of the RIGHT hemisphere due to a RIGHT cochlear implant with beam attenuation. Vascular: No hyperdense vessel  or unexpected calcification. Skull: Normal. Negative for fracture or focal lesion. Sinuses/Orbits: No acute finding. Other: Status post mastoidectomy on the RIGHT. Status post RIGHT cochlear implant. Compared with 2005, some progression of atrophy. IMPRESSION: Atrophy and small vessel disease. No acute intracranial findings. Limitation of interpretation due to beam  hardening from cochlear implant. Electronically Signed   By: Elsie Stain M.D.   On: 08/06/2016 10:58    Procedures Procedures (including critical care time)  Medications Ordered in ED Medications  0.9 %  sodium chloride infusion ( Intravenous New Bag/Given 08/07/16 0218)  glimepiride (AMARYL) tablet 2 mg (2 mg Oral Given 08/07/16 0944)  linagliptin (TRADJENTA) tablet 5 mg (5 mg Oral Given 08/07/16 0944)  simvastatin (ZOCOR) tablet 40 mg (40 mg Oral Given 08/06/16 2117)  aspirin chewable tablet 81 mg (81 mg Oral Given 08/07/16 0944)  acetaminophen (TYLENOL) tablet 650 mg (650 mg Oral Given 08/06/16 2120)    Or  acetaminophen (TYLENOL) solution 650 mg ( Per Tube See Alternative 08/06/16 2120)    Or  acetaminophen (TYLENOL) suppository 650 mg ( Rectal See Alternative 08/06/16 2120)  enoxaparin (LOVENOX) injection 30 mg (30 mg Subcutaneous Given 08/06/16 2117)  cephALEXin (KEFLEX) capsule 500 mg (not administered)  insulin aspart (novoLOG) injection 0-9 Units (0 Units Subcutaneous Not Given 08/07/16 1200)  insulin aspart (novoLOG) injection 0-5 Units (not administered)  sodium chloride 0.9 % bolus 250 mL (0 mLs Intravenous Stopped 08/06/16 1307)  cefTRIAXone (ROCEPHIN) 1 g in dextrose 5 % 50 mL IVPB (0 g Intravenous Stopped 08/07/16 1249)   stroke: mapping our early stages of recovery book ( Does not apply Given 08/07/16 0403)     Initial Impression / Assessment and Plan / ED Course  I have reviewed the triage vital signs and the nursing notes.  Pertinent labs & imaging results that were available during my care of the patient were reviewed by me and considered in my medical decision making (see chart for details).     Pt here for change in mental status, nonfocal exam in the ED.  Family state she continues to be not at her baseline in the ED.  BMP with mild hyponatremia.  Hospitalist consulted for admission for further work up and treatment.    Final Clinical Impressions(s) / ED Diagnoses    Final diagnoses:  Transient alteration of awareness  Acute UTI  Hyponatremia    New Prescriptions Current Discharge Medication List    START taking these medications   Details  cephALEXin (KEFLEX) 500 MG capsule Take 1 capsule (500 mg total) by mouth every 12 (twelve) hours. Qty: 12 capsule, Refills: 0         Tilden Fossa, MD 08/07/16 1355

## 2016-08-07 ENCOUNTER — Observation Stay (HOSPITAL_COMMUNITY): Payer: Medicare Other

## 2016-08-07 ENCOUNTER — Observation Stay (HOSPITAL_BASED_OUTPATIENT_CLINIC_OR_DEPARTMENT_OTHER): Payer: Medicare Other

## 2016-08-07 DIAGNOSIS — E871 Hypo-osmolality and hyponatremia: Secondary | ICD-10-CM | POA: Diagnosis present

## 2016-08-07 DIAGNOSIS — G9340 Encephalopathy, unspecified: Secondary | ICD-10-CM | POA: Diagnosis not present

## 2016-08-07 DIAGNOSIS — I10 Essential (primary) hypertension: Secondary | ICD-10-CM

## 2016-08-07 DIAGNOSIS — G459 Transient cerebral ischemic attack, unspecified: Secondary | ICD-10-CM

## 2016-08-07 DIAGNOSIS — N390 Urinary tract infection, site not specified: Secondary | ICD-10-CM

## 2016-08-07 DIAGNOSIS — E1122 Type 2 diabetes mellitus with diabetic chronic kidney disease: Secondary | ICD-10-CM | POA: Diagnosis not present

## 2016-08-07 LAB — VAS US CAROTID
CHL CUP DOP CALC LVOT VTI: 19.3 cm
CHL CUP MV DEC (S): 165
E decel time: 165 msec
E/e' ratio: 16.35
FS: 34 % (ref 28–44)
IV/PV OW: 1.09
LA ID, A-P, ES: 31 mm
LA vol index: 22 mL/m2
LA vol: 36 mL
LADIAMINDEX: 1.9 cm/m2
LAVOLA4C: 36.2 mL
LDCA: 2.54 cm2
LEFT ATRIUM END SYS DIAM: 31 mm
LV E/e'average: 16.35
LV PW d: 11.8 mm — AB (ref 0.6–1.1)
LV e' LATERAL: 4.82 cm/s
LVEEMED: 16.35
LVOT SV: 49 mL
LVOTD: 18 mm
LVOTPV: 94.2 cm/s
Lateral S' vel: 9.69 cm/s
MV pk E vel: 78.8 m/s
MVPG: 2 mmHg
MVPKAVEL: 135 m/s
Reg peak vel: 256 cm/s
TAPSE: 20.7 mm
TDI e' lateral: 4.82
TDI e' medial: 6.48
TRMAXVEL: 256 cm/s

## 2016-08-07 LAB — GLUCOSE, CAPILLARY
GLUCOSE-CAPILLARY: 121 mg/dL — AB (ref 65–99)
GLUCOSE-CAPILLARY: 135 mg/dL — AB (ref 65–99)
Glucose-Capillary: 157 mg/dL — ABNORMAL HIGH (ref 65–99)
Glucose-Capillary: 59 mg/dL — ABNORMAL LOW (ref 65–99)
Glucose-Capillary: 93 mg/dL (ref 65–99)

## 2016-08-07 LAB — BASIC METABOLIC PANEL
Anion gap: 7 (ref 5–15)
BUN: 21 mg/dL — AB (ref 6–20)
CHLORIDE: 103 mmol/L (ref 101–111)
CO2: 23 mmol/L (ref 22–32)
Calcium: 8.8 mg/dL — ABNORMAL LOW (ref 8.9–10.3)
Creatinine, Ser: 1.45 mg/dL — ABNORMAL HIGH (ref 0.44–1.00)
GFR calc Af Amer: 36 mL/min — ABNORMAL LOW (ref >60)
GFR, EST NON AFRICAN AMERICAN: 31 mL/min — AB (ref >60)
GLUCOSE: 139 mg/dL — AB (ref 65–99)
POTASSIUM: 4.2 mmol/L (ref 3.5–5.1)
Sodium: 133 mmol/L — ABNORMAL LOW (ref 135–145)

## 2016-08-07 LAB — LIPID PANEL
CHOLESTEROL: 144 mg/dL (ref 0–200)
HDL: 80 mg/dL (ref >40)
LDL Cholesterol: 52 mg/dL (ref 0–99)
TRIGLYCERIDES: 59 mg/dL (ref <150)
Total CHOL/HDL Ratio: 1.8 RATIO
VLDL: 12 mg/dL (ref 0–40)

## 2016-08-07 LAB — ECHOCARDIOGRAM COMPLETE

## 2016-08-07 MED ORDER — CEPHALEXIN 500 MG PO CAPS: 500.0000 mg | ORAL_CAPSULE | Freq: Two times a day (BID) | ORAL | 0 refills | Status: DC

## 2016-08-07 MED ORDER — INSULIN ASPART 100 UNIT/ML ~~LOC~~ SOLN: 0.0000 [IU] | Freq: Three times a day (TID) | SUBCUTANEOUS | Status: DC

## 2016-08-07 MED ORDER — INSULIN ASPART 100 UNIT/ML ~~LOC~~ SOLN: 0.0000 [IU] | Freq: Every day | SUBCUTANEOUS | Status: DC

## 2016-08-07 MED ORDER — CEPHALEXIN 500 MG PO CAPS
500.0000 mg | ORAL_CAPSULE | Freq: Two times a day (BID) | ORAL | Status: DC
  Administered 2016-08-07: 500 mg via ORAL
  Filled 2016-08-07: qty 1

## 2016-08-07 NOTE — Discharge Summary (Signed)
Jeanne Barker, is a 83 y.o. female  DOB 21-Sep-1923  MRN [id number].  Admission date:  08/06/2016  Admitting Physician  Haydee Salter, MD  Discharge Date:  08/07/2016   Primary MD  [name], MD  Recommendations for primary care physician for things to follow:    Acute encephalopathy secondary to uti CT brain negative.  Carotid ultrasound pending. prel negative Cardiac echo EF =60-65%, no PFO  Uti Rocephin 1gm iv qday (4/28)=> keflex  po bid  Hyponatremia Likely secondary to lasix. /dehydration Pt was hydrated w ns iv  Na=133 (4/29) Please check bmp in 1 week.   CKD stage 4 Check bmp in 1 week  Dm2 Resume current medications.   Hyperlpidemia Cont Zocor  Hypertension Held lasix due to hyponatremia Unclear if needs this medication or not,  Will restart Please f/u with pcp in 1 week for bp check and bmp  Anemia Please check cbc in 1 week Consider check ferritin, iron, tibc, folate, b12, spep, upep, tsh as an outpatient Please f/u with pcp    Admission Diagnosis  Transient alteration of awareness [R40.4] Hyponatremia [E87.1] Acute UTI [N39.0]   Discharge Diagnosis  Transient alteration of awareness [R40.4] Hyponatremia [E87.1] Acute UTI [N39.0]    Principal Problem:   Acute encephalopathy Active Problems:   DMII (diabetes mellitus, type 2) (HCC)   Anemia   CKD (chronic kidney disease) stage 4, GFR 15-29 ml/min (HCC)   UTI (urinary tract infection)   Essential hypertension   Hyperlipidemia   Arthritis of right knee   Abnormal urinalysis   Hyponatremia      Past Medical History:  Diagnosis Date  . Anemia   . Chronic renal insufficiency   . Diabetes mellitus   . Hard of hearing   . Hyperlipidemia   . Hypertension   . Osteoarthritis   . Syphilis    ?latent?, treated c/ PCN 1993    Past Surgical History:  Procedure Laterality Date  . ABDOMINAL  HYSTERECTOMY     BSO  . CATARACT EXTRACTION    . cochlear impalnt  1/02  . NEPHRECTOMY     left  . OOPHORECTOMY         HPI  from the history and physical done on the day of admission:    83 y.o. female with medical history significant for diabetes mellitus 2 on oral medications, HLD, HTN, CKD 4, chronic R knee pain secondary to osteoarthritis presented to the ER after family noticed generalized weakness, confusion and slurred speech onset this morning. Patient was at her baseline mentation as of yesterday evening when family last saw her. They did not notice any focal neurological deficits such as facial drooping or focal weakness. She was unable to hold a spoon briefly. Family reports that since arriving to the ER she has returned to baseline. Patient appears somewhat dehydrated with a sodium of 130. Urinalysis also abnormal and is concerning for possible UTI. CBGs are also elevated greater than 250. CT head negative for acute  process.  ED Course:  Vital Signs: BP 131/82   Pulse 62   Temp 98.1 F (36.7 C)   Resp 18   SpO2 100%  CT head without contrast: Neg 2 view CXR: Neg Lab Data: Sodium 130, potassium 5.1, chloride 97, CO2 24, glucose 261, anion gap 9, BUN 22, creatinine 1.79, albumin 3.1, poc troponin 0.00, white count 5600 with normal differential, hemoglobin 9.9, platelets 254,000; urinalysis abnormal with hazy appearance, many bacteria, 50 glucose, small leukocytes, nitrite negative, WBCs 6-30 Medications and treatments: Normal saline bolus 250 mL      Hospital Course:     Pt was admitted for acute encephalopathy.  Her daughter states that at home she had confusion, and generalized weakness, didn't mention slurred speech.  Pt presented to ED as above and CT brain 4/29  was negative for acute process.  Pt had ua + for Uti. >=100,000 CFU for gram negative rods.  She also had a uti on 06/17/2016 (E. Coli, sensitive to cephazolin).  CXR 4/29 negative for acute process. Pt is at  baseline this morning,  Her confusion has resolved after tx with Rocephin.  There was some intermittent bigeminy on her ekg strip.  Ekg  06/18/2014 shows evidence of bigeminy.     Pt feels like she would like to go home today.       Follow UP  Follow-up Information    [name], MD Follow up in 1 week(s).   Specialty:  Internal Medicine Contact information: [address] [place] [address] [phone]            Consults obtained - none  Discharge Condition: stable  Diet and Activity recommendation: See Discharge Instructions below  Discharge Instructions         Discharge Medications     Allergies as of 08/07/2016      Reactions   Amlodipine Besylate Nausea Only   Lisinopril Other (See Comments)   REACTION: hyperkalemia      Medication List    TAKE these medications   acetaminophen 500 MG tablet Commonly known as:  TYLENOL Take 500 mg by mouth every 6 (six) hours as needed for mild pain.   aspirin 81 MG tablet Take 81 mg by mouth daily.   cephALEXin 500 MG capsule Commonly known as:  KEFLEX Take 1 capsule (500 mg total) by mouth every 12 (twelve) hours.   cloNIDine 0.1 MG tablet Commonly known as:  CATAPRES Take 1 tablet (0.1 mg total) by mouth 2 (two) times daily.   furosemide 20 MG tablet Commonly known as:  LASIX Take 1 tablet (20 mg total) by mouth daily.   glimepiride 2 MG tablet Commonly known as:  AMARYL Take 1 tablet (2 mg total) by mouth daily with breakfast.   glucose blood test strip Commonly known as:  RELION GLUCOSE TEST STRIPS Check no more than twice daily.   RELION ULTRA THIN PLUS LANCETS Misc Check no more than twice daily   simvastatin 40 MG tablet Commonly known as:  ZOCOR Take 1 tablet (40 mg total) by mouth at bedtime.   sitaGLIPtin 50 MG tablet Commonly known as:  JANUVIA Take 1 tablet (50 mg total) by mouth daily.   trolamine salicylate 10 % cream Commonly known as:  ASPERCREME Apply  topically as needed.   vitamin B-12 100 MCG tablet Commonly known as:  CYANOCOBALAMIN Take 100 mcg by mouth daily.       Major procedures and Radiology Reports - PLEASE  review detailed and final reports for all details, in brief -     Dg Chest 2 View  Result Date: 08/06/2016 CLINICAL DATA:  Altered mental status. EXAM: CHEST  2 VIEW COMPARISON:  06/18/2014 FINDINGS: There is mild bilateral interstitial thickening likely chronic. There is no focal parenchymal opacity. There is no pleural effusion or pneumothorax. The heart and mediastinal contours are unremarkable. The osseous structures are unremarkable. IMPRESSION: No active cardiopulmonary disease. Electronically Signed   By: Elige Ko   On: 08/06/2016 11:42   Ct Head Wo Contrast  Result Date: 08/06/2016 CLINICAL DATA:  Altered mental status. Last seen normal at 1:30 p.m. yesterday. History of diabetes, hyperlipidemia, syphilis, hypertension, and chronic renal insufficiency. EXAM: CT HEAD WITHOUT CONTRAST TECHNIQUE: Contiguous axial images were obtained from the base of the skull through the vertex without intravenous contrast. COMPARISON:  CT head 08/29/2003. FINDINGS: Brain: No evidence for acute infarction, hemorrhage, mass lesion, hydrocephalus, or extra-axial fluid. Advanced atrophy, not unexpected for age. Hypoattenuation of white matter consistent with small vessel disease. Significant obscuration of portions of the RIGHT hemisphere due to a RIGHT cochlear implant with beam attenuation. Vascular: No hyperdense vessel or unexpected calcification. Skull: Normal. Negative for fracture or focal lesion. Sinuses/Orbits: No acute finding. Other: Status post mastoidectomy on the RIGHT. Status post RIGHT cochlear implant. Compared with 2005, some progression of atrophy. IMPRESSION: Atrophy and small vessel disease. No acute intracranial findings. Limitation of interpretation due to beam hardening from cochlear implant. Electronically Signed    By: Elsie Stain M.D.   On: 08/06/2016 10:58    Micro Results     Recent Results (from the past 240 hour(s))  Culture, Urine     Status: Abnormal (Preliminary result)   Collection Time: 08/06/16 12:32 PM  Result Value Ref Range Status   Specimen Description URINE, RANDOM  Final   Special Requests NONE  Final   Culture >=100,000 COLONIES/mL GRAM NEGATIVE RODS (A)  Final   Report Status PENDING  Incomplete       Today   Subjective    Jeanne Barker today is back to baseline. axox3.  Hard of hearing stilll.   no headache,no chest abdominal pain,no new weakness tingling or numbness, feels much better wants to go home today.     Objective   Blood pressure (!) 144/51, pulse 79, temperature 97.6 F (36.4 C), temperature source Oral, resp. rate 16, SpO2 99 %.   Intake/Output Summary (Last 24 hours) at 08/07/16 1210 Last data filed at 08/07/16 0218  Gross per 24 hour  Intake          1276.25 ml  Output                0 ml  Net          1276.25 ml    Exam Awake Alert, Oriented x 3, No new F.N deficits, Normal affect [place].AT,PERRAL Supple Neck,No JVD, No cervical lymphadenopathy appriciated.  Symmetrical Chest wall movement, Good air movement bilaterally, CTAB RRR,No Gallops,Rubs or new Murmurs, No Parasternal Heave +ve B.Sounds, Abd Soft, Non tender, No organomegaly appriciated, No rebound -guarding or rigidity. No Cyanosis, Clubbing or edema, No new Rash or bruise   Data Review   CBC w Diff: Lab Results  Component Value Date   WBC 5.6 08/06/2016   HGB 9.9 (L) 08/06/2016   HCT 30.1 (L) 08/06/2016   PLT 254 08/06/2016   LYMPHOPCT 25 08/06/2016   MONOPCT 13 08/06/2016   EOSPCT 3 08/06/2016  BASOPCT 0 08/06/2016    CMP: Lab Results  Component Value Date   NA 133 (L) 08/07/2016   K 4.2 08/07/2016   CL 103 08/07/2016   CO2 23 08/07/2016   BUN 21 (H) 08/07/2016   CREATININE 1.45 (H) 08/07/2016   CREATININE 2.24 (H) 05/24/2011   PROT 5.6 (L) 08/06/2016   ALBUMIN  3.1 (L) 08/06/2016   BILITOT 0.3 08/06/2016   ALKPHOS 59 08/06/2016   AST 18 08/06/2016   ALT 10 (L) 08/06/2016  .   Total Time in preparing paper work, data evaluation and todays exam - 35 minutes  Pearson Grippe M.D on 08/07/2016 at 12:10 PM  [facility]   Office  [phone]

## 2016-08-07 NOTE — Progress Notes (Signed)
VASCULAR LAB PRELIMINARY  PRELIMINARY  PRELIMINARY  PRELIMINARY  Carotid duplex completed.    Preliminary report:  1-39% ICA plaquing. Vertebral artery flow is antegrade.   Sapir Lavey, RVT 08/07/2016, 4:59 PM

## 2016-08-07 NOTE — Evaluation (Addendum)
Physical Therapy Evaluation Patient Details Name: Jeanne Barker MRN: [id number] DOB: 03/08/1924 Today's Date: 08/07/2016   History of Present Illness  Pt is a 83 y.o. female who presented to the ED after family noticed generalized weakness, confusion, and slurred speech onset morning of 4/28. She was briefly unable to hold a spoon. Family reports she has returned to baseline. Pt has a PMH significant for anemia, chronic renal insufficiency, diabetes mellitus, hard of hearing, hyperlipidemia, hypertension, osteoarthritis, and syphilis. CT negative for acute abnormality. Per internal medicine, pt with possible infection and/or dehydration.  Clinical Impression  Pt admitted with above diagnosis. Pt currently with functional limitations due to the deficits listed below (see PT Problem List). Pt was able to ambulate with RW with min guard assist and cane with min assist. Pt loses balance if challenged.  Will have 24 hour care at home and will need it initially.  HH f/u recommended.  Pt will benefit from skilled PT to increase their independence and safety with mobility to allow discharge to the venue listed below.      Follow Up Recommendations Home health PT;Supervision/Assistance - 24 hour    Equipment Recommendations  None recommended by PT    Recommendations for Other Services       Precautions / Restrictions Precautions Precautions: Fall Restrictions Weight Bearing Restrictions: No      Mobility  Bed Mobility Overal bed mobility: Modified Independent                Transfers Overall transfer level: Needs assistance Equipment used: Rolling walker (2 wheeled);Quad cane Transfers: Sit to/from Stand Sit to Stand: Min guard         General transfer comment: Min guard assist for safety and to steady when rising.   Ambulation/Gait Ambulation/Gait assistance: Min guard;Min assist Ambulation Distance (Feet): 300 Feet (150 feet x 2) Assistive device: Rolling walker (2  wheeled);Straight cane Gait Pattern/deviations: Step-through pattern;Decreased stride length;Antalgic;Wide base of support;Trunk flexed;Drifts right/left;Staggering right   Gait velocity interpretation: Below normal speed for age/gender General Gait Details: Pt ambulated with cane initially.  Needed assist and cues at times as she staggers right needing steadying assist.  Pt widens BOS to compensate for instability as well as she uses cane to catch her balance. Pt then used RW and was much safer with the RW but still needed some cues to stay inside RW.  Daughters state pt will not be left alone and they will stay with her all the time.   Stairs            Wheelchair Mobility    Modified Rankin (Stroke Patients Only)       Balance Overall balance assessment: Needs assistance Sitting-balance support: No upper extremity supported;Feet supported Sitting balance-Leahy Scale: Good     Standing balance support: Bilateral upper extremity supported;During functional activity Standing balance-Leahy Scale: Poor Standing balance comment: relies on UES for support.  Even with RW, needs bil UE support and min guard assist if challenged.              High level balance activites: Direction changes;Turns;Sudden stops High Level Balance Comments: min guard assist with RW with challenges.             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     [employer]         Extremity/Trunk Assessment   Upper Extremity Assessment Upper Extremity Assessment: Defer to OT evaluation    Lower Extremity Assessment Lower Extremity Assessment: Generalized weakness    Cervical / Trunk Assessment Cervical / Trunk Assessment: Kyphotic  Communication   Communication: HOH (cochlear implant)  Cognition Arousal/Alertness: Awake/alert Behavior During Therapy: WFL for tasks assessed/performed Overall Cognitive Status: Within Functional Limits for tasks assessed                                        General Comments      Exercises     Assessment/Plan    PT Assessment Patient needs continued PT services  PT Problem List Decreased strength;Decreased activity tolerance;Decreased balance;Decreased mobility;Decreased knowledge of use of DME;Decreased safety awareness;Decreased knowledge of precautions       PT Treatment Interventions DME instruction;Gait training;Functional mobility training;Therapeutic activities;Therapeutic exercise;Balance training;Patient/family education    PT Goals (Current goals can be found in the Care Plan section)  Acute Rehab PT Goals Patient Stated Goal: to go home PT Goal Formulation: With patient Time For Goal Achievement: 08/14/16 Potential to Achieve Goals: Good    Frequency Min 3X/week   Barriers to discharge        Co-evaluation               End of Session Equipment Utilized During Treatment: Gait belt Activity Tolerance: Patient limited by fatigue Patient left: with call bell/phone within reach;in bed;with family/visitor present Nurse Communication: Mobility status PT Visit Diagnosis: Unsteadiness on feet (R26.81);Muscle weakness (generalized) (M62.81)    Time: 4540-9811 PT Time Calculation (min) (ACUTE ONLY): 21 min   Charges:   PT Evaluation $PT Eval Moderate Complexity: 1 Procedure     PT G Codes:   PT G-Codes **NOT FOR INPATIENT CLASS** Functional Assessment Tool Used:  AM-PAC 6 Clicks Basic Mobility Functional Limitation: Mobility: Walking and moving around Mobility: Walking and Moving Around Current Status (B1478): At least 20 percent but less than 40 percent impaired, limited or restricted Mobility: Walking and Moving Around Goal Status [phone]): At least 1 percent but less than 20 percent impaired, limited or restricted    [facility] Acute Rehabilitation [phone] (pager)   Berline Lopes 08/07/2016, 2:44 PM

## 2016-08-07 NOTE — Progress Notes (Signed)
  Echocardiogram 2D Echocardiogram has been performed.  Jeanne Barker 08/07/2016, 4:33 PM

## 2016-08-07 NOTE — Evaluation (Signed)
Occupational Therapy Evaluation Patient Details Name: Jeanne Barker MRN: [id number] DOB: 1924/03/20 Today's Date: 08/07/2016    History of Present Illness Pt is a 83 y.o. female who presented to the ED after family noticed generalized weakness, confusion, and slurred speech onset morning of 4/28. She was briefly unable to hold a spoon. Family reports she has returned to baseline. Pt has a PMH significant for anemia, chronic renal insufficiency, diabetes mellitus, hard of hearing, hyperlipidemia, hypertension, osteoarthritis, and syphilis. CT negative for acute abnormality. Per internal medicine, pt with possible infection and/or dehydration.   Clinical Impression   PTA, pt was independent with assistive devices for ADL and functional mobility. She reports working 4 hours per day.Pt currently requires min guard assist for standing ADL and toilet transfers. She presents with generalized weakness, decreased balance, and decreased activity tolerance for ADL limiting ability to participate in ADL at [facility]. Pt's daughters present during session and report that they will be taking turns staying with the pt overnight. Recommended that pt refrain from work while recovering from hospital stay and she verbalizes and demonstrates understanding. OT will continue to follow while admitted and recommend home health OT services post-acute D/C for follow-up.      Follow Up Recommendations  Home health OT;Supervision - Intermittent    Equipment Recommendations  None recommended by OT    Recommendations for Other Services       Precautions / Restrictions Precautions Precautions: Fall Restrictions Weight Bearing Restrictions: No      Mobility Bed Mobility Overal bed mobility: Modified Independent                Transfers Overall transfer level: Needs assistance Equipment used: Rolling walker (2 wheeled);Quad cane Transfers: Sit to/from Stand Sit to Stand: Min guard         General transfer  comment: Min guard assist for safety and to steady when rising.     Balance Overall balance assessment: Needs assistance Sitting-balance support: No upper extremity supported;Feet supported Sitting balance-Leahy Scale: Fair     Standing balance support: Bilateral upper extremity supported;No upper extremity supported;During functional activity;Single extremity supported Standing balance-Leahy Scale: Poor                             ADL either performed or assessed with clinical judgement   ADL Overall ADL's : Needs assistance/impaired Eating/Feeding: Set up;Sitting   Grooming: Set up;Sitting   Upper Body Bathing: Set up;Sitting   Lower Body Bathing: Min guard;Sit to/from stand   Upper Body Dressing : Set up;Sitting   Lower Body Dressing: Min guard;Sit to/from stand   Toilet Transfer: Min guard;Ambulation;RW (cane)   Toileting- Clothing Manipulation and Hygiene: Min guard;Sit to/from stand       Functional mobility during ADLs: Min guard;Rolling walker;Cane General ADL Comments: Attempted functional mobility with both RW and cane. Pt more steady wtih RW but less familiar with use. Daughter's report that they will be able to stay over with pt during the night.     Vision Baseline Vision/History: Wears glasses Wears Glasses: At all times Patient Visual Report: No change from baseline Vision Assessment?: No apparent visual deficits     Perception     Praxis      Pertinent Vitals/Pain Pain Assessment: No/denies pain     Hand Dominance     Extremity/Trunk Assessment Upper Extremity Assessment Upper Extremity Assessment: Generalized weakness   Lower Extremity Assessment Lower Extremity Assessment: Generalized weakness  Communication Communication Communication: HOH (cochlear implant)   Cognition Arousal/Alertness: Awake/alert Behavior During Therapy: WFL for tasks assessed/performed Overall Cognitive Status: Within Functional Limits for  tasks assessed                                     General Comments       Exercises     Shoulder Instructions      Home Living Family/patient expects to be discharged to:: Private residence Living Arrangements: Alone Available Help at Discharge: Family;Available PRN/intermittently (Daughters check in throughout the day; will stay nights) Type of Home: House Home Access: Level entry     Home Layout: One level     Bathroom Shower/Tub: Walk-in shower         Home Equipment: Environmental consultant - 2 wheels;Cane - single point;Shower seat          Prior Functioning/Environment Level of Independence: Independent with assistive device(s)        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        OT Problem List: Decreased strength;Decreased activity tolerance;Impaired balance (sitting and/or standing);Decreased safety awareness;Decreased knowledge of use of DME or AE;Decreased knowledge of precautions      OT Treatment/Interventions: Self-care/ADL training;Therapeutic exercise;Energy conservation;DME and/or AE instruction;Therapeutic activities;Patient/family education;Balance training    OT Goals(Current goals can be found in the care plan section) Acute Rehab OT Goals Patient Stated Goal: to go home OT Goal Formulation: With patient/family Time For Goal Achievement: 08/21/16 Potential to Achieve Goals: Good ADL Goals Pt Will Perform Grooming: with modified independence;standing Pt Will Perform Upper Body Dressing: with modified independence;standing Pt Will Perform Lower Body Dressing: with modified independence;sit to/from stand Pt Will Transfer to Toilet: with modified independence;ambulating;regular height toilet Pt Will Perform Toileting - Clothing Manipulation and hygiene: with modified independence;sit to/from stand Pt/caregiver will Perform Home Exercise Program: Increased strength;Both right and left upper extremity;With Supervision;With written HEP  provided  OT Frequency: Min 2X/week   Barriers to D/C:            Co-evaluation              End of Session Equipment Utilized During Treatment: Gait belt;Rolling walker Nurse Communication: Mobility status  Activity Tolerance: Patient tolerated treatment well Patient left: in chair;with call bell/phone within reach;with family/visitor present  OT Visit Diagnosis: Unsteadiness on feet (R26.81);Muscle weakness (generalized) (M62.81)                Time: 4098-1191 OT Time Calculation (min): 41 min Charges:  OT General Charges $OT Visit: 1 Procedure OT Evaluation $OT Eval Moderate Complexity: 1 Procedure OT Treatments $Self Care/Home Management : 23-37 mins G-Codes: OT G-codes **NOT FOR INPATIENT CLASS** Functional Assessment Tool Used: AM-PAC 6 Clicks Daily Activity Functional Limitation: Self care Self Care Current Status (Y7829): At least 20 percent but less than 40 percent impaired, limited or restricted Self Care Goal Status (F6213): At least 1 percent but less than 20 percent impaired, limited or restricted   Doristine Section, MS OTR/L  Pager: [phone]   Carol Theys A Huckleberry Martinson 08/07/2016, 9:22 AM

## 2016-08-07 NOTE — Progress Notes (Signed)
Pt discharged from the unit. IV taken out and telemetry off. Pt and family verbalizes discharge understanding. No distress noted.

## 2016-08-08 ENCOUNTER — Telehealth: Payer: Self-pay | Admitting: Behavioral Health

## 2016-08-08 LAB — HEMOGLOBIN A1C
Hgb A1c MFr Bld: 7.3 % — ABNORMAL HIGH (ref 4.8–5.6)
Hgb A1c MFr Bld: 7.3 % — ABNORMAL HIGH (ref 4.8–5.6)
MEAN PLASMA GLUCOSE: 163 mg/dL
Mean Plasma Glucose: 163 mg/dL

## 2016-08-08 LAB — URINE CULTURE: Culture: >=100000 — AB

## 2016-08-08 NOTE — Telephone Encounter (Signed)
Spoke with the patient's daughter [name]) & she stated "that the patient was in the hospital due to a urinary tract infection, but was given antibiotics and now back to her usual self". She declines a hospital follow-up visit at this time. The patient has a 6 month follow-up visit scheduled for 11/15/16 at 8:30 AM with PCP. Per [facility], if anything changes with her mother, she will call the office for an appointment.

## 2016-08-11 ENCOUNTER — Telehealth: Payer: Self-pay | Admitting: Internal Medicine

## 2016-08-11 ENCOUNTER — Inpatient Hospital Stay (HOSPITAL_COMMUNITY)
Admission: EM | Admit: 2016-08-11 | Discharge: 2016-08-16 | DRG: 641 | Disposition: A | Discharge status: Home Health | Payer: Medicare Other | Attending: Internal Medicine | Admitting: Internal Medicine

## 2016-08-11 ENCOUNTER — Observation Stay (HOSPITAL_COMMUNITY): Payer: Medicare Other

## 2016-08-11 ENCOUNTER — Encounter (HOSPITAL_COMMUNITY): Payer: Self-pay

## 2016-08-11 DIAGNOSIS — Z888 Allergy status to other drugs, medicaments and biological substances status: Secondary | ICD-10-CM

## 2016-08-11 DIAGNOSIS — Z905 Acquired absence of kidney: Secondary | ICD-10-CM

## 2016-08-11 DIAGNOSIS — M79605 Pain in left leg: Secondary | ICD-10-CM | POA: Diagnosis present

## 2016-08-11 DIAGNOSIS — N390 Urinary tract infection, site not specified: Secondary | ICD-10-CM | POA: Diagnosis not present

## 2016-08-11 DIAGNOSIS — I129 Hypertensive chronic kidney disease with stage 1 through stage 4 chronic kidney disease, or unspecified chronic kidney disease: Secondary | ICD-10-CM | POA: Diagnosis present

## 2016-08-11 DIAGNOSIS — N179 Acute kidney failure, unspecified: Secondary | ICD-10-CM

## 2016-08-11 DIAGNOSIS — E1151 Type 2 diabetes mellitus with diabetic peripheral angiopathy without gangrene: Secondary | ICD-10-CM | POA: Diagnosis not present

## 2016-08-11 DIAGNOSIS — H9190 Unspecified hearing loss, unspecified ear: Secondary | ICD-10-CM | POA: Diagnosis present

## 2016-08-11 DIAGNOSIS — Z79899 Other long term (current) drug therapy: Secondary | ICD-10-CM

## 2016-08-11 DIAGNOSIS — Z9621 Cochlear implant status: Secondary | ICD-10-CM | POA: Diagnosis present

## 2016-08-11 DIAGNOSIS — T502X5A Adverse effect of carbonic-anhydrase inhibitors, benzothiadiazides and other diuretics, initial encounter: Secondary | ICD-10-CM | POA: Diagnosis present

## 2016-08-11 DIAGNOSIS — E1122 Type 2 diabetes mellitus with diabetic chronic kidney disease: Secondary | ICD-10-CM | POA: Diagnosis present

## 2016-08-11 DIAGNOSIS — I10 Essential (primary) hypertension: Secondary | ICD-10-CM | POA: Diagnosis not present

## 2016-08-11 DIAGNOSIS — E871 Hypo-osmolality and hyponatremia: Secondary | ICD-10-CM | POA: Diagnosis not present

## 2016-08-11 DIAGNOSIS — M79604 Pain in right leg: Secondary | ICD-10-CM | POA: Diagnosis present

## 2016-08-11 DIAGNOSIS — E785 Hyperlipidemia, unspecified: Secondary | ICD-10-CM | POA: Diagnosis present

## 2016-08-11 DIAGNOSIS — D649 Anemia, unspecified: Secondary | ICD-10-CM | POA: Diagnosis present

## 2016-08-11 DIAGNOSIS — Z90710 Acquired absence of both cervix and uterus: Secondary | ICD-10-CM

## 2016-08-11 DIAGNOSIS — Z974 Presence of external hearing-aid: Secondary | ICD-10-CM

## 2016-08-11 DIAGNOSIS — M79606 Pain in leg, unspecified: Secondary | ICD-10-CM | POA: Diagnosis not present

## 2016-08-11 DIAGNOSIS — E119 Type 2 diabetes mellitus without complications: Secondary | ICD-10-CM

## 2016-08-11 DIAGNOSIS — R208 Other disturbances of skin sensation: Secondary | ICD-10-CM | POA: Diagnosis present

## 2016-08-11 DIAGNOSIS — R600 Localized edema: Secondary | ICD-10-CM | POA: Diagnosis not present

## 2016-08-11 DIAGNOSIS — R52 Pain, unspecified: Secondary | ICD-10-CM

## 2016-08-11 DIAGNOSIS — Z87891 Personal history of nicotine dependence: Secondary | ICD-10-CM

## 2016-08-11 DIAGNOSIS — M5136 Other intervertebral disc degeneration, lumbar region: Secondary | ICD-10-CM | POA: Diagnosis present

## 2016-08-11 DIAGNOSIS — E781 Pure hyperglyceridemia: Secondary | ICD-10-CM | POA: Diagnosis not present

## 2016-08-11 DIAGNOSIS — N184 Chronic kidney disease, stage 4 (severe): Secondary | ICD-10-CM | POA: Diagnosis present

## 2016-08-11 DIAGNOSIS — Z7982 Long term (current) use of aspirin: Secondary | ICD-10-CM

## 2016-08-11 DIAGNOSIS — Z90722 Acquired absence of ovaries, bilateral: Secondary | ICD-10-CM

## 2016-08-11 DIAGNOSIS — E872 Acidosis: Secondary | ICD-10-CM | POA: Diagnosis present

## 2016-08-11 DIAGNOSIS — Z7984 Long term (current) use of oral hypoglycemic drugs: Secondary | ICD-10-CM

## 2016-08-11 DIAGNOSIS — E875 Hyperkalemia: Secondary | ICD-10-CM | POA: Diagnosis present

## 2016-08-11 DIAGNOSIS — R609 Edema, unspecified: Secondary | ICD-10-CM

## 2016-08-11 LAB — BASIC METABOLIC PANEL
ANION GAP: 9 (ref 5–15)
BUN: 15 mg/dL (ref 6–20)
CHLORIDE: 87 mmol/L — AB (ref 101–111)
CO2: 22 mmol/L (ref 22–32)
Calcium: 9.3 mg/dL (ref 8.9–10.3)
Creatinine, Ser: 1.4 mg/dL — ABNORMAL HIGH (ref 0.44–1.00)
GFR calc Af Amer: 37 mL/min — ABNORMAL LOW (ref >60)
GFR calc non Af Amer: 32 mL/min — ABNORMAL LOW (ref >60)
GLUCOSE: 130 mg/dL — AB (ref 65–99)
POTASSIUM: 4.6 mmol/L (ref 3.5–5.1)
Sodium: 118 mmol/L — CL (ref 135–145)

## 2016-08-11 LAB — CBC WITH DIFFERENTIAL/PLATELET
Basophils Absolute: 0 10*3/uL (ref 0.0–0.1)
Basophils Relative: 0 %
EOS PCT: 2 %
Eosinophils Absolute: 0.1 10*3/uL (ref 0.0–0.7)
HEMATOCRIT: 31.5 % — AB (ref 36.0–46.0)
Hemoglobin: 10.6 g/dL — ABNORMAL LOW (ref 12.0–15.0)
LYMPHS ABS: 1.4 10*3/uL (ref 0.7–4.0)
LYMPHS PCT: 26 %
MCH: 27 pg (ref 26.0–34.0)
MCHC: 33.7 g/dL (ref 30.0–36.0)
MCV: 80.2 fL (ref 78.0–100.0)
MONO ABS: 0.6 10*3/uL (ref 0.1–1.0)
Monocytes Relative: 12 %
NEUTROS ABS: 3.2 10*3/uL (ref 1.7–7.7)
Neutrophils Relative %: 60 %
PLATELETS: 268 10*3/uL (ref 150–400)
RBC: 3.93 MIL/uL (ref 3.87–5.11)
RDW: 12.5 % (ref 11.5–15.5)
WBC: 5.3 10*3/uL (ref 4.0–10.5)

## 2016-08-11 LAB — CULTURE, BLOOD (ROUTINE X 2)
CULTURE: NO GROWTH
Culture: NO GROWTH
SPECIAL REQUESTS: ADEQUATE
Special Requests: ADEQUATE

## 2016-08-11 LAB — CK: Total CK: 154 U/L (ref 38–234)

## 2016-08-11 MED ORDER — SODIUM CHLORIDE 0.9% FLUSH
3.0000 mL | Freq: Two times a day (BID) | INTRAVENOUS | Status: DC
  Administered 2016-08-12 – 2016-08-15 (×7): 3 mL via INTRAVENOUS

## 2016-08-11 MED ORDER — OXYCODONE-ACETAMINOPHEN 5-325 MG PO TABS
1.0000 | ORAL_TABLET | ORAL | Status: DC | PRN
  Administered 2016-08-11: 1 via ORAL
  Filled 2016-08-11: qty 1

## 2016-08-11 MED ORDER — CLONIDINE HCL 0.1 MG PO TABS
0.1000 mg | ORAL_TABLET | Freq: Two times a day (BID) | ORAL | Status: DC
  Administered 2016-08-12 – 2016-08-16 (×9): 0.1 mg via ORAL
  Filled 2016-08-11 (×9): qty 1

## 2016-08-11 MED ORDER — SODIUM CHLORIDE 0.9 % IV SOLN
Freq: Once | INTRAVENOUS | Status: AC
  Administered 2016-08-11: 22:00:00 via INTRAVENOUS

## 2016-08-11 MED ORDER — INSULIN ASPART 100 UNIT/ML ~~LOC~~ SOLN: 0.0000 [IU] | Freq: Every day | SUBCUTANEOUS | Status: DC

## 2016-08-11 MED ORDER — ONDANSETRON HCL 4 MG PO TABS: 4.0000 mg | ORAL_TABLET | Freq: Four times a day (QID) | ORAL | Status: DC | PRN

## 2016-08-11 MED ORDER — CEPHALEXIN 500 MG PO CAPS
500.0000 mg | ORAL_CAPSULE | Freq: Two times a day (BID) | ORAL | Status: AC
  Administered 2016-08-12 – 2016-08-13 (×4): 500 mg via ORAL
  Filled 2016-08-11 (×4): qty 1

## 2016-08-11 MED ORDER — SIMVASTATIN 40 MG PO TABS
40.0000 mg | ORAL_TABLET | Freq: Every day | ORAL | Status: DC
  Administered 2016-08-12 – 2016-08-15 (×4): 40 mg via ORAL
  Filled 2016-08-11 (×4): qty 1

## 2016-08-11 MED ORDER — VITAMIN B-12 100 MCG PO TABS
100.0000 ug | ORAL_TABLET | Freq: Every day | ORAL | Status: DC
  Administered 2016-08-12 – 2016-08-16 (×5): 100 ug via ORAL
  Filled 2016-08-11 (×5): qty 1

## 2016-08-11 MED ORDER — ACETAMINOPHEN 325 MG PO TABS
650.0000 mg | ORAL_TABLET | Freq: Once | ORAL | Status: AC
  Administered 2016-08-11: 650 mg via ORAL
  Filled 2016-08-11: qty 2

## 2016-08-11 MED ORDER — INSULIN ASPART 100 UNIT/ML ~~LOC~~ SOLN
0.0000 [IU] | Freq: Three times a day (TID) | SUBCUTANEOUS | Status: DC
  Administered 2016-08-12: 1 [IU] via SUBCUTANEOUS
  Administered 2016-08-12: 3 [IU] via SUBCUTANEOUS
  Administered 2016-08-13 – 2016-08-14 (×4): 2 [IU] via SUBCUTANEOUS
  Administered 2016-08-15: 1 [IU] via SUBCUTANEOUS
  Administered 2016-08-15 – 2016-08-16 (×2): 3 [IU] via SUBCUTANEOUS

## 2016-08-11 MED ORDER — SODIUM CHLORIDE 0.9 % IV SOLN: Freq: Once | INTRAVENOUS | Status: AC

## 2016-08-11 MED ORDER — ONDANSETRON HCL 4 MG/2ML IJ SOLN
4.0000 mg | Freq: Four times a day (QID) | INTRAMUSCULAR | Status: DC | PRN
  Administered 2016-08-14 – 2016-08-15 (×2): 4 mg via INTRAVENOUS
  Filled 2016-08-11 (×2): qty 2

## 2016-08-11 MED ORDER — HYDRALAZINE HCL 20 MG/ML IJ SOLN
5.0000 mg | INTRAMUSCULAR | Status: DC | PRN
  Administered 2016-08-11 – 2016-08-12 (×2): 5 mg via INTRAVENOUS
  Filled 2016-08-11 (×2): qty 1

## 2016-08-11 MED ORDER — HEPARIN SODIUM (PORCINE) 5000 UNIT/ML IJ SOLN
5000.0000 [IU] | Freq: Three times a day (TID) | INTRAMUSCULAR | Status: DC
  Administered 2016-08-12 – 2016-08-16 (×13): 5000 [IU] via SUBCUTANEOUS
  Filled 2016-08-11 (×13): qty 1

## 2016-08-11 MED ORDER — ASPIRIN 81 MG PO CHEW
81.0000 mg | CHEWABLE_TABLET | Freq: Every day | ORAL | Status: DC
  Administered 2016-08-12 – 2016-08-16 (×5): 81 mg via ORAL
  Filled 2016-08-11 (×5): qty 1

## 2016-08-11 MED ORDER — ZOLPIDEM TARTRATE 5 MG PO TABS
5.0000 mg | ORAL_TABLET | Freq: Every evening | ORAL | Status: DC | PRN
  Administered 2016-08-13 – 2016-08-14 (×3): 5 mg via ORAL
  Filled 2016-08-11 (×3): qty 1

## 2016-08-11 MED ORDER — ACETAMINOPHEN 500 MG PO TABS
500.0000 mg | ORAL_TABLET | Freq: Four times a day (QID) | ORAL | Status: DC | PRN
  Administered 2016-08-13: 500 mg via ORAL
  Filled 2016-08-11: qty 1

## 2016-08-11 NOTE — Telephone Encounter (Signed)
Left patient a message to call office back to schedule an appointment

## 2016-08-11 NOTE — ED Notes (Signed)
Lab called with critical NA level of 118, Dr Madilyn Hookees notified.

## 2016-08-11 NOTE — ED Notes (Signed)
Pt placed on bedpan

## 2016-08-11 NOTE — H&P (Signed)
History and Physical    Jeanne Barker WUJ:[id number] DOB: 04/30/1923 DOA: 08/11/2016  Referring MD/NP/PA:   PCP: [name], MD   Patient coming from:  The patient is coming from home.  At baseline, pt is partially dependent for most of ADL.   Chief Complaint: Bilateral leg pain  HPI: Jeanne Barker is a 83 y.o. female with medical history significant of hypertension, hyperlipidemia, diabetes mellitus, syphilis, hard of hearing (s/p of R cochlear implantation), CKD-III, anemia, who presents with bilateral leg pain.  Patient was recently hospitalized from 04/28-4/29 due to altered mental status secondary to UTI, patient was treated with antibiotics, and discharged on Keflex. Patient needs to take 2 more days of UTI. She states that her symptoms of UTI has resolved. Her mental status is normal today.  Patient states that she started having bilateral leg pain since Sunday. The leg pain involves both lower legs, is constant, 10 out of 10 in safety, nonradiating. Sensations normal. No leg weakness. No back pain, no incontinence of bladder or bowel movement. Patient denies chest pain, cough, shortness rest, fever, chills, GI symptoms, symptoms of UTI.   ED Course: pt was found to haveWBC 5.3, sodium 118, stable renal function, temperature normal, no tachycardia, oxygen saturation 100% on room air. Patient's pulses of DP/PT are difficult to palpable, but are easily detectable by portable Doppler in both legs.   Review of Systems:   General: no fevers, chills, no changes in body weight, has fatigue HEENT: no blurry vision, hearing changes or sore throat Respiratory: no dyspnea, coughing, wheezing CV: no chest pain, no palpitations GI: no nausea, vomiting, abdominal pain, diarrhea, constipation GU: no dysuria, burning on urination, increased urinary frequency, hematuria  Ext: no leg edema Neuro: no unilateral weakness, numbness, or tingling, no vision change or hearing loss Skin: no rash, no skin  tear. MSK: has severe bilateral leg pain.  Heme: No easy bruising.  Travel history: No recent long distant travel.  Allergy:  Allergies  Allergen Reactions  . Amlodipine Besylate Nausea Only  . Lisinopril Other (See Comments)    REACTION: hyperkalemia    Past Medical History:  Diagnosis Date  . Anemia   . Chronic renal insufficiency   . Diabetes mellitus   . Hard of hearing   . Hyperlipidemia   . Hypertension   . Osteoarthritis   . Syphilis    ?latent?, treated c/ PCN 1993    Past Surgical History:  Procedure Laterality Date  . ABDOMINAL HYSTERECTOMY     BSO  . CATARACT EXTRACTION    . cochlear impalnt  1/02  . NEPHRECTOMY     left  . OOPHORECTOMY      Social History:  reports that she has quit smoking. She has never used smokeless tobacco. She reports that she does not drink alcohol or use drugs.  Family History:  Family History  Problem Relation Age of Onset  . CAD Neg Hx      Prior to Admission medications   Medication Sig Start Date End Date Taking? Authorizing Provider  acetaminophen (TYLENOL) 500 MG tablet Take 500 mg by mouth every 6 (six) hours as needed for mild pain.     Historical Provider, MD  aspirin 81 MG tablet Take 81 mg by mouth daily.      Historical Provider, MD  cephALEXin (KEFLEX) 500 MG capsule Take 1 capsule (500 mg total) by mouth every 12 (twelve) hours. 08/07/16   Pearson GrippeJames Kim, MD  cloNIDine (CATAPRES) 0.1 MG tablet  Take 1 tablet (0.1 mg total) by mouth 2 (two) times daily. 07/19/16   [name], MD  furosemide (LASIX) 20 MG tablet Take 1 tablet (20 mg total) by mouth daily. 05/09/16   [name], MD  glimepiride (AMARYL) 2 MG tablet Take 1 tablet (2 mg total) by mouth daily with breakfast. 04/12/16   [name], MD  glucose blood (RELION GLUCOSE TEST STRIPS) test strip Check no more than twice daily. 06/30/14   [name], MD  RELION ULTRA THIN PLUS LANCETS MISC Check no more than twice daily 06/30/14   [name], MD  simvastatin (ZOCOR) 40 MG  tablet Take 1 tablet (40 mg total) by mouth at bedtime. 03/09/16   [name], MD  sitaGLIPtin (JANUVIA) 50 MG tablet Take 1 tablet (50 mg total) by mouth daily. 06/20/16   [name], MD  trolamine salicylate (ASPERCREME) 10 % cream Apply topically as needed. Patient not taking: Reported on 08/06/2016 12/06/12   Kelle Darting, NP  vitamin B-12 (CYANOCOBALAMIN) 100 MCG tablet Take 100 mcg by mouth daily.     Historical Provider, MD    Physical Exam: Vitals:   08/11/16 2215 08/11/16 2245 08/11/16 2300 08/11/16 2315  BP: (!) 173/64 (!) 175/66 (!) 168/64 (!) 174/62  Pulse: 67 66 68 67  Resp:      Temp:      TempSrc:      SpO2: 100% 100% 100% 100%  Weight:      Height:       General: Not in acute distress HEENT:       Eyes: PERRL, EOMI, no scleral icterus.       ENT: No discharge from the ears and nose, no pharynx injection, no tonsillar enlargement.        Neck: No JVD, no bruit, no mass felt. Heme: No neck lymph node enlargement. Cardiac: S1/S2, RRR, No murmurs, No gallops or rubs. Respiratory: No rales, wheezing, rhonchi or rubs. GI: Soft, nondistended, nontender, no rebound pain, no organomegaly, BS present. GU: No hematuria Ext: No pitting leg edema bilaterally. Patient's pulses of DP/PT are difficult to palpable, but are easily detectable by portable Doppler in both legs. The feet are cool. Musculoskeletal: has severe tenderness in both lower legs, even does not allow to touch her skin of legs. Skin: No rashes.  Neuro: Alert, oriented X3, cranial nerves II-XII grossly intact, moves all extremities normally.  Psych: Patient is not psychotic, no suicidal or hemocidal ideation.  Labs on Admission: I have personally reviewed following labs and imaging studies  CBC:  Recent Labs Lab 08/06/16 1013 08/11/16 2019  WBC 5.6 5.3  NEUTROABS 3.3 3.2  HGB 9.9* 10.6*  HCT 30.1* 31.5*  MCV 83.4 80.2  PLT 254 268   Basic Metabolic Panel:  Recent Labs Lab 08/06/16 1013  08/06/16 1822 08/06/16 2213 08/07/16 0138 08/11/16 2019  NA 130* 130* 129* 133* 118*  K 5.1 5.2* 4.3 4.2 4.6  CL 97* 97* 100* 103 87*  CO2 24 23 22 23 22   GLUCOSE 261* 85 215* 139* 130*  BUN 22* 22* 21* 21* 15  CREATININE 1.79* 1.56* 1.48* 1.45* 1.40*  CALCIUM 9.1 9.2 8.5* 8.8* 9.3   GFR: Estimated Creatinine Clearance: 21.8 mL/min (A) (by C-G formula based on SCr of 1.4 mg/dL (H)). Liver Function Tests:  Recent Labs Lab 08/06/16 1013  AST 18  ALT 10*  ALKPHOS 59  BILITOT 0.3  PROT 5.6*  ALBUMIN 3.1*  No results for input(s): LIPASE, AMYLASE in the last 168 hours. No results for input(s): AMMONIA in the last 168 hours. Coagulation Profile:  Recent Labs Lab 08/06/16 1013  INR 1.05   Cardiac Enzymes:  Recent Labs Lab 08/06/16 1642 08/11/16 2019  CKTOTAL  --  154  TROPONINI 0.03*  --    BNP (last 3 results) No results for input(s): PROBNP in the last 8760 hours. HbA1C: No results for input(s): HGBA1C in the last 72 hours. CBG:  Recent Labs Lab 08/07/16 0015 08/07/16 0404 08/07/16 0740 08/07/16 1133 08/07/16 1632  GLUCAP 157* 93 59* 121* 135*   Lipid Profile: No results for input(s): CHOL, HDL, LDLCALC, TRIG, CHOLHDL, LDLDIRECT in the last 72 hours. Thyroid Function Tests: No results for input(s): TSH, T4TOTAL, FREET4, T3FREE, THYROIDAB in the last 72 hours. Anemia Panel: No results for input(s): VITAMINB12, FOLATE, FERRITIN, TIBC, IRON, RETICCTPCT in the last 72 hours. Urine analysis:    Component Value Date/Time   COLORURINE YELLOW 08/06/2016 1200   APPEARANCEUR HAZY (A) 08/06/2016 1200   LABSPEC 1.005 08/06/2016 1200   PHURINE 6.0 08/06/2016 1200   GLUCOSEU 50 (A) 08/06/2016 1200   HGBUR NEGATIVE 08/06/2016 1200   BILIRUBINUR NEGATIVE 08/06/2016 1200   KETONESUR NEGATIVE 08/06/2016 1200   PROTEINUR NEGATIVE 08/06/2016 1200   UROBILINOGEN 0.2 06/18/2014 2200   NITRITE NEGATIVE 08/06/2016 1200   LEUKOCYTESUR SMALL (A) 08/06/2016 1200    Sepsis Labs: @LABRCNTIP (procalcitonin:4,lacticidven:4) ) Recent Results (from the past 240 hour(s))  Culture, Urine     Status: Abnormal   Collection Time: 08/06/16 12:32 PM  Result Value Ref Range Status   Specimen Description URINE, RANDOM  Final   Special Requests NONE  Final   Culture >=100,000 COLONIES/mL ESCHERICHIA COLI (A)  Final   Report Status 08/08/2016 FINAL  Final   Organism ID, Bacteria ESCHERICHIA COLI (A)  Final      Susceptibility   Escherichia coli - MIC*    AMPICILLIN >=32 RESISTANT Resistant     CEFAZOLIN <=4 SENSITIVE Sensitive     CEFTRIAXONE <=1 SENSITIVE Sensitive     CIPROFLOXACIN >=4 RESISTANT Resistant     GENTAMICIN <=1 SENSITIVE Sensitive     IMIPENEM <=0.25 SENSITIVE Sensitive     NITROFURANTOIN <=16 SENSITIVE Sensitive     TRIMETH/SULFA >=320 RESISTANT Resistant     AMPICILLIN/SULBACTAM >=32 RESISTANT Resistant     PIP/TAZO <=4 SENSITIVE Sensitive     Extended ESBL NEGATIVE Sensitive     * >=100,000 COLONIES/mL ESCHERICHIA COLI  Culture, blood (Routine X 2) w Reflex to ID Panel     Status: None   Collection Time: 08/06/16  1:07 PM  Result Value Ref Range Status   Specimen Description BLOOD LEFT ANTECUBITAL  Final   Special Requests IN PEDIATRIC BOTTLE Blood Culture adequate volume  Final   Culture NO GROWTH 5 DAYS  Final   Report Status 08/11/2016 FINAL  Final  Culture, blood (Routine X 2) w Reflex to ID Panel     Status: None   Collection Time: 08/06/16  1:07 PM  Result Value Ref Range Status   Specimen Description BLOOD LEFT WRIST  Final   Special Requests IN PEDIATRIC BOTTLE Blood Culture adequate volume  Final   Culture NO GROWTH 5 DAYS  Final   Report Status 08/11/2016 FINAL  Final     Radiological Exams on Admission: No results found.   EKG:  Not done in ED, will get one.   Assessment/Plan Principal Problem:   Leg pain Active  Problems:   DMII (diabetes mellitus, type 2) (HCC)   CKD (chronic kidney disease) stage 4, GFR  15-29 ml/min (HCC)   Essential hypertension   Hyperlipidemia   Hyponatremia   Bilateral Leg pain: Etiology is not clear. Likely due to ischemic limb. Both feet are cool. Patient's pulses of DP/PT are difficult to palpable, but are easily detectable by portable Doppler in both legs. Will also need to r/o lumbar spin issues. Pt is s/p of R cochlear implantation, cannot do MRI. Pt has hx of syphilis, will need to rule out this possibility  -will place on tele bed for obs -get ABI -get CT-L spin without contrast -prn percocet and tylenol for pain.  -check RPR  Hyponatremia: Sodium 118. Mental status normal. This is recurrent issue, unclear etiology. Lasix is on her medication list, patient is not sure if she is taking these medications. - Will check urine sodium, urine osmolality, serum osmolality. - check TSH - IVF: normal saline at 75 mL/h - check BMP q6h -Hold Lasix  DM-II: Last A1c 7.3 on 08/07/16,  fairly controled. Patient is taking Amaryl and Januvia at home -SSI  CKD-II: stable. Baseline creatinine 1.4 - 1.5. Her creatinine is 1.40, BUN 15. -Follow-up renal function by pain at  HTN: -Clonidine -IV hydralazine when necessary  Recent UTI: Symptoms resolved -let patient to complete course of Keflex, needs 2 more days  Hyperlipidemia:  -zocor   DVT ppx: SQ Lovenox Code Status: Full code Family Communication:  Yes, patient's 3 daughters at bed side Disposition Plan:  Anticipate discharge back to previous home environment Consults called:  none Admission status: Obs / tele      Date of Service 08/11/2016    Lorretta Harp [facility] Pager [phone]  If 7PM-7AM, please contact night-coverage [email or web address] Password [email or web address] 08/11/2016, 11:29 PM

## 2016-08-11 NOTE — ED Notes (Signed)
Pt placed on bedside commode by this RN and Tanya EMT.

## 2016-08-11 NOTE — ED Triage Notes (Signed)
Per Pt's daughter, Pt reports right leg pain since she was discharged on Sunday. Pt complain when this nurses touches it. Pulses noted to be intact.

## 2016-08-11 NOTE — ED Provider Notes (Signed)
[facility]-EMERGENCY DEPT Provider Note   CSN: [id number] Arrival date & time: 08/11/16  1727     History   Chief Complaint Chief Complaint  Patient presents with  . Leg Pain    HPI Jeanne Barker is a 83 y.o. female.  The history is provided by the patient and a relative. No language interpreter was used.  Leg Pain     Jeanne Barker is a 83 y.o. female who presents to the Emergency Department complaining of leg pain. She presents for evaluation of right leg pain has been ongoing for the past week. Pain developed when she was in the hospital for change in mental status. She is discharged from the hospital on Keflex for urinary tract infection. She is taking her medications as directed. Family reported the pain as been present worse with activity and touching her feet. No fever, chest pain, shortness breath, malaise. She is having difficult to ambulate due to pain in her legs. Past Medical History:  Diagnosis Date  . Anemia   . Chronic renal insufficiency   . Diabetes mellitus   . Hard of hearing   . Hyperlipidemia   . Hypertension   . Osteoarthritis   . Syphilis    ?latent?, treated c/ PCN 1993    Patient Active Problem List   Diagnosis Date Noted  . Leg pain 08/11/2016  . Hyponatremia 08/07/2016  . Acute encephalopathy 08/06/2016  . Abnormal urinalysis 08/06/2016  . Arthritis of right knee 05/25/2016  . Acute UTI 06/19/2014  . Essential hypertension 06/19/2014  . Hyperlipidemia 06/19/2014  . Well adult on routine health check 06/17/2013  . OSTEOARTHRITIS 02/18/2008  . CKD (chronic kidney disease) stage 4, GFR 15-29 ml/min (HCC) 10/12/2006  . DMII (diabetes mellitus, type 2) (HCC) 08/17/2006  . Anemia 08/17/2006    Past Surgical History:  Procedure Laterality Date  . ABDOMINAL HYSTERECTOMY     BSO  . CATARACT EXTRACTION    . cochlear impalnt  1/02  . NEPHRECTOMY     left  . OOPHORECTOMY      OB History    No data available       Home Medications     Prior to Admission medications   Medication Sig Start Date End Date Taking? Authorizing Provider  acetaminophen (TYLENOL) 500 MG tablet Take 500 mg by mouth every 6 (six) hours as needed for mild pain.    Yes Historical Provider, MD  aspirin 81 MG tablet Take 81 mg by mouth daily.     Yes Historical Provider, MD  cephALEXin (KEFLEX) 500 MG capsule Take 1 capsule (500 mg total) by mouth every 12 (twelve) hours. 08/07/16  Yes Pearson Grippe, MD  cloNIDine (CATAPRES) 0.1 MG tablet Take 1 tablet (0.1 mg total) by mouth 2 (two) times daily. 07/19/16  Yes [name], MD  furosemide (LASIX) 20 MG tablet Take 1 tablet (20 mg total) by mouth daily. 05/09/16  Yes [name], MD  glimepiride (AMARYL) 2 MG tablet Take 1 tablet (2 mg total) by mouth daily with breakfast. 04/12/16  Yes [name], MD  simvastatin (ZOCOR) 40 MG tablet Take 1 tablet (40 mg total) by mouth at bedtime. Patient taking differently: Take 40 mg by mouth daily.  03/09/16  Yes [name], MD  sitaGLIPtin (JANUVIA) 50 MG tablet Take 1 tablet (50 mg total) by mouth daily. 06/20/16  Yes [name], MD  vitamin B-12 (CYANOCOBALAMIN) 100 MCG tablet Take 100 mcg by mouth daily.  Yes Historical Provider, MD  glucose blood (RELION GLUCOSE TEST STRIPS) test strip Check no more than twice daily. 06/30/14   Wanda PlumpJose E Paz, MD  RELION ULTRA THIN PLUS LANCETS MISC Check no more than twice daily 06/30/14   Wanda PlumpJose E Paz, MD  trolamine salicylate (ASPERCREME) 10 % cream Apply topically as needed. Patient not taking: Reported on 08/06/2016 12/06/12   Kelle DartingLayne C Weaver, NP    Family History Family History  Problem Relation Age of Onset  . CAD Neg Hx     Social History Social History  Substance Use Topics  . Smoking status: Former Games developermoker  . Smokeless tobacco: Never Used  . Alcohol use No     Allergies   Amlodipine besylate and Lisinopril   Review of Systems Review of Systems  All other systems reviewed and are negative.    Physical Exam Updated  Vital Signs BP (!) 165/83   Pulse 65   Temp 97.9 F (36.6 C) (Oral)   Resp 18   Ht 5' (1.524 m)   Wt 135 lb (61.2 kg)   SpO2 100%   BMI 26.37 kg/m   Physical Exam  Constitutional: She is oriented to person, place, and time. She appears well-developed and well-nourished.  HENT:  Head: Normocephalic and atraumatic.  Cardiovascular: Normal rate and regular rhythm.   No murmur heard. Pulmonary/Chest: Effort normal and breath sounds normal. No respiratory distress.  Abdominal: Soft. There is no tenderness. There is no rebound and no guarding.  Musculoskeletal: She exhibits no tenderness.  1+ edema to bilateral lower extremities. There is moderate tenderness to palpation throughout bilateral legs. 2+ femoral pulses bilaterally  Neurological: She is alert and oriented to person, place, and time.  5 out of 5 strength in all 4 extremities  Skin: Skin is warm and dry.  Psychiatric: She has a normal mood and affect. Her behavior is normal.  Nursing note and vitals reviewed.    ED Treatments / Results  Labs (all labs ordered are listed, but only abnormal results are displayed) Labs Reviewed  BASIC METABOLIC PANEL - Abnormal; Notable for the following:       Result Value   Sodium 118 (*)    Chloride 87 (*)    Glucose, Bld 130 (*)    Creatinine, Ser 1.40 (*)    GFR calc non Af Amer 32 (*)    GFR calc Af Amer 37 (*)    All other components within normal limits  CBC WITH DIFFERENTIAL/PLATELET - Abnormal; Notable for the following:    Hemoglobin 10.6 (*)    HCT 31.5 (*)    All other components within normal limits  CK  OSMOLALITY, URINE  OSMOLALITY  TSH  SODIUM, URINE, RANDOM  CBC  BASIC METABOLIC PANEL  BASIC METABOLIC PANEL    EKG  EKG Interpretation None       Radiology No results found.  Procedures Procedures (including critical care time)  Medications Ordered in ED Medications  oxyCODONE-acetaminophen (PERCOCET/ROXICET) 5-325 MG per tablet 1 tablet (1  tablet Oral Given 08/11/16 2329)  0.9 %  sodium chloride infusion (not administered)  cephALEXin (KEFLEX) capsule 500 mg (not administered)  cloNIDine (CATAPRES) tablet 0.1 mg (not administered)  simvastatin (ZOCOR) tablet 40 mg (not administered)  acetaminophen (TYLENOL) tablet 500 mg (not administered)  aspirin chewable tablet 81 mg (not administered)  vitamin B-12 (CYANOCOBALAMIN) tablet 100 mcg (not administered)  hydrALAZINE (APRESOLINE) injection 5 mg (5 mg Intravenous Given 08/11/16 2328)  insulin aspart (novoLOG) injection 0-9  Units (not administered)  insulin aspart (novoLOG) injection 0-5 Units (not administered)  heparin injection 5,000 Units (not administered)  sodium chloride flush (NS) 0.9 % injection 3 mL (not administered)  ondansetron (ZOFRAN) tablet 4 mg (not administered)    Or  ondansetron (ZOFRAN) injection 4 mg (not administered)  zolpidem (AMBIEN) tablet 5 mg (not administered)  acetaminophen (TYLENOL) tablet 650 mg (650 mg Oral Given 08/11/16 2030)  0.9 %  sodium chloride infusion ( Intravenous New Bag/Given 08/11/16 2203)     Initial Impression / Assessment and Plan / ED Course  I have reviewed the triage vital signs and the nursing notes.  Pertinent labs & imaging results that were available during my care of the patient were reviewed by me and considered in my medical decision making (see chart for details).     Patient here for evaluation bilateral leg pain. She has diffuse tenderness to palpation throughout bilateral lower extremities with no evidence of acute injury or infectious process. Labs demonstrate hyponatremia that is worsened since hospital discharge.  Plan to provide gentle IV fluid hydration for her hyponatremia and admitted to the hospitalist service for further workup and treatment. Patient updated findings the recommendation for admission she agrees with plan.  Final Clinical Impressions(s) / ED Diagnoses   Final diagnoses:  Leg pain  Leg pain     New Prescriptions New Prescriptions   No medications on file     Tilden Fossa, MD 08/11/16 2337

## 2016-08-12 ENCOUNTER — Encounter (HOSPITAL_COMMUNITY): Payer: Self-pay

## 2016-08-12 ENCOUNTER — Observation Stay (HOSPITAL_COMMUNITY): Payer: Medicare Other

## 2016-08-12 DIAGNOSIS — Z7982 Long term (current) use of aspirin: Secondary | ICD-10-CM | POA: Diagnosis not present

## 2016-08-12 DIAGNOSIS — M79609 Pain in unspecified limb: Secondary | ICD-10-CM

## 2016-08-12 DIAGNOSIS — Z905 Acquired absence of kidney: Secondary | ICD-10-CM | POA: Diagnosis not present

## 2016-08-12 DIAGNOSIS — E875 Hyperkalemia: Secondary | ICD-10-CM | POA: Diagnosis not present

## 2016-08-12 DIAGNOSIS — N184 Chronic kidney disease, stage 4 (severe): Secondary | ICD-10-CM | POA: Diagnosis not present

## 2016-08-12 DIAGNOSIS — M79604 Pain in right leg: Secondary | ICD-10-CM | POA: Diagnosis not present

## 2016-08-12 DIAGNOSIS — M79606 Pain in leg, unspecified: Secondary | ICD-10-CM | POA: Diagnosis not present

## 2016-08-12 DIAGNOSIS — D649 Anemia, unspecified: Secondary | ICD-10-CM | POA: Diagnosis present

## 2016-08-12 DIAGNOSIS — E1121 Type 2 diabetes mellitus with diabetic nephropathy: Secondary | ICD-10-CM

## 2016-08-12 DIAGNOSIS — M47816 Spondylosis without myelopathy or radiculopathy, lumbar region: Secondary | ICD-10-CM | POA: Diagnosis not present

## 2016-08-12 DIAGNOSIS — I10 Essential (primary) hypertension: Secondary | ICD-10-CM | POA: Diagnosis not present

## 2016-08-12 DIAGNOSIS — N179 Acute kidney failure, unspecified: Secondary | ICD-10-CM | POA: Diagnosis not present

## 2016-08-12 DIAGNOSIS — E871 Hypo-osmolality and hyponatremia: Secondary | ICD-10-CM | POA: Diagnosis not present

## 2016-08-12 DIAGNOSIS — Z87891 Personal history of nicotine dependence: Secondary | ICD-10-CM | POA: Diagnosis not present

## 2016-08-12 DIAGNOSIS — M1711 Unilateral primary osteoarthritis, right knee: Secondary | ICD-10-CM | POA: Diagnosis not present

## 2016-08-12 DIAGNOSIS — E1122 Type 2 diabetes mellitus with diabetic chronic kidney disease: Secondary | ICD-10-CM | POA: Diagnosis present

## 2016-08-12 DIAGNOSIS — R208 Other disturbances of skin sensation: Secondary | ICD-10-CM | POA: Diagnosis present

## 2016-08-12 DIAGNOSIS — M79605 Pain in left leg: Secondary | ICD-10-CM | POA: Diagnosis not present

## 2016-08-12 DIAGNOSIS — H9190 Unspecified hearing loss, unspecified ear: Secondary | ICD-10-CM | POA: Diagnosis present

## 2016-08-12 DIAGNOSIS — E872 Acidosis: Secondary | ICD-10-CM | POA: Diagnosis present

## 2016-08-12 DIAGNOSIS — N390 Urinary tract infection, site not specified: Secondary | ICD-10-CM | POA: Diagnosis present

## 2016-08-12 DIAGNOSIS — E785 Hyperlipidemia, unspecified: Secondary | ICD-10-CM | POA: Diagnosis present

## 2016-08-12 DIAGNOSIS — Z9621 Cochlear implant status: Secondary | ICD-10-CM | POA: Diagnosis present

## 2016-08-12 DIAGNOSIS — M5136 Other intervertebral disc degeneration, lumbar region: Secondary | ICD-10-CM | POA: Diagnosis present

## 2016-08-12 DIAGNOSIS — Z7984 Long term (current) use of oral hypoglycemic drugs: Secondary | ICD-10-CM | POA: Diagnosis not present

## 2016-08-12 DIAGNOSIS — Z90722 Acquired absence of ovaries, bilateral: Secondary | ICD-10-CM | POA: Diagnosis not present

## 2016-08-12 DIAGNOSIS — Z90710 Acquired absence of both cervix and uterus: Secondary | ICD-10-CM | POA: Diagnosis not present

## 2016-08-12 DIAGNOSIS — Z888 Allergy status to other drugs, medicaments and biological substances status: Secondary | ICD-10-CM | POA: Diagnosis not present

## 2016-08-12 DIAGNOSIS — I129 Hypertensive chronic kidney disease with stage 1 through stage 4 chronic kidney disease, or unspecified chronic kidney disease: Secondary | ICD-10-CM | POA: Diagnosis present

## 2016-08-12 DIAGNOSIS — Z79899 Other long term (current) drug therapy: Secondary | ICD-10-CM | POA: Diagnosis not present

## 2016-08-12 DIAGNOSIS — E1151 Type 2 diabetes mellitus with diabetic peripheral angiopathy without gangrene: Secondary | ICD-10-CM | POA: Diagnosis present

## 2016-08-12 LAB — CBC
HEMATOCRIT: 34.2 % — AB (ref 36.0–46.0)
Hemoglobin: 11.9 g/dL — ABNORMAL LOW (ref 12.0–15.0)
MCH: 28.1 pg (ref 26.0–34.0)
MCHC: 34.8 g/dL (ref 30.0–36.0)
MCV: 80.7 fL (ref 78.0–100.0)
PLATELETS: 260 10*3/uL (ref 150–400)
RBC: 4.24 MIL/uL (ref 3.87–5.11)
RDW: 12.9 % (ref 11.5–15.5)
WBC: 5.6 10*3/uL (ref 4.0–10.5)

## 2016-08-12 LAB — BASIC METABOLIC PANEL
ANION GAP: 12 (ref 5–15)
ANION GAP: 11 (ref 5–15)
BUN: 13 mg/dL (ref 6–20)
BUN: 12 mg/dL (ref 6–20)
CALCIUM: 9.1 mg/dL (ref 8.9–10.3)
CALCIUM: 9.2 mg/dL (ref 8.9–10.3)
CO2: 21 mmol/L — AB (ref 22–32)
CO2: 21 mmol/L — ABNORMAL LOW (ref 22–32)
CREATININE: 1.23 mg/dL — AB (ref 0.44–1.00)
CREATININE: 1.15 mg/dL — AB (ref 0.44–1.00)
Chloride: 87 mmol/L — ABNORMAL LOW (ref 101–111)
Chloride: 91 mmol/L — ABNORMAL LOW (ref 101–111)
GFR, EST AFRICAN AMERICAN: 43 mL/min — AB (ref >60)
GFR, EST AFRICAN AMERICAN: 47 mL/min — AB (ref >60)
GFR, EST NON AFRICAN AMERICAN: 37 mL/min — AB (ref >60)
GFR, EST NON AFRICAN AMERICAN: 41 mL/min — AB (ref >60)
Glucose, Bld: 204 mg/dL — ABNORMAL HIGH (ref 65–99)
Glucose, Bld: 188 mg/dL — ABNORMAL HIGH (ref 65–99)
Potassium: 3.9 mmol/L (ref 3.5–5.1)
Potassium: 4.6 mmol/L (ref 3.5–5.1)
SODIUM: 120 mmol/L — AB (ref 135–145)
SODIUM: 123 mmol/L — AB (ref 135–145)

## 2016-08-12 LAB — RPR: RPR Ser Ql: NONREACTIVE

## 2016-08-12 LAB — GLUCOSE, CAPILLARY
GLUCOSE-CAPILLARY: 173 mg/dL — AB (ref 65–99)
GLUCOSE-CAPILLARY: 187 mg/dL — AB (ref 65–99)
GLUCOSE-CAPILLARY: 50 mg/dL — AB (ref 65–99)
Glucose-Capillary: 55 mg/dL — ABNORMAL LOW (ref 65–99)
Glucose-Capillary: 129 mg/dL — ABNORMAL HIGH (ref 65–99)
Glucose-Capillary: 236 mg/dL — ABNORMAL HIGH (ref 65–99)
Glucose-Capillary: 154 mg/dL — ABNORMAL HIGH (ref 65–99)

## 2016-08-12 LAB — TSH: TSH: 3.012 u[IU]/mL (ref 0.350–4.500)

## 2016-08-12 LAB — OSMOLALITY: Osmolality: 256 mOsm/kg — ABNORMAL LOW (ref 275–295)

## 2016-08-12 MED ORDER — SODIUM CHLORIDE 0.9 % IV SOLN
INTRAVENOUS | Status: AC
  Administered 2016-08-13: 21:00:00 via INTRAVENOUS

## 2016-08-12 MED ORDER — DEXTROSE 50 % IV SOLN
INTRAVENOUS | Status: AC
  Administered 2016-08-12: 50 mL
  Filled 2016-08-12: qty 50

## 2016-08-12 NOTE — Progress Notes (Signed)
Inpatient Diabetes Program Recommendations  AACE/ADA: New Consensus Statement on Inpatient Glycemic Control (2015)  Target Ranges:  Prepandial:   less than 140 mg/dL      Peak postprandial:   less than 180 mg/dL (1-2 hours)      Critically ill patients:  140 - 180 mg/dL   Lab Results  Component Value Date   GLUCAP 129 (H) 08/12/2016   HGBA1C 7.3 (H) 08/07/2016    Review of Glycemic Control Results for [name], [name] (MRN [id number]) as of 08/12/2016 11:31  Ref. Range 08/07/2016 16:32 08/12/2016 00:23 08/12/2016 01:11 08/12/2016 02:06 08/12/2016 07:53  Glucose-Capillary Latest Ref Range: 65 - 99 mg/dL 956135 (H) 50 (L) 55 (L) 213173 (H) 129 (H)   Diabetes history: DM2 Outpatient Diabetes medications: Amaryl 2 mg + Januvia 50 qd Current orders for Inpatient glycemic control: Novolog correction 0-9 units + 0-5 units hs  Inpatient Diabetes Program Recommendations:  Please consider: D/C hs Novolog correction Consider D/C Amaryl on D/C home due to low CBGs after admission. Will follow.  Thank you, Billy FischerJudy E. Mikeisha Lemonds, RN, MSN, CDE  Diabetes Coordinator Inpatient Glycemic Control Team Team Pager [phone] (8am-5pm) 08/12/2016 11:35 AM

## 2016-08-12 NOTE — Progress Notes (Signed)
VASCULAR LAB PRELIMINARY  ARTERIAL  ABI completed:    RIGHT    LEFT    PRESSURE WAVEFORM  PRESSURE WAVEFORM  BRACHIAL 118 Triphasic BRACHIAL  Not obtained  DP  Monophasic DP  Monophasic  AT   AT    PT  Biphasic PT  Unable to obtain due to patient pain  PER   PER    GREAT TOE 114 NA GREAT TOE 107 NA    RIGHT LEFT  TBI 0.97 0.91   Patient in significant pain and unable to tolerate the pressure from the transducer to obtain Doppler flow, and blood pressures. Therefore, unable to calculate ABIs bilaterally.  TBIs are within normal limits bilaterally.  08/12/2016 12:11 PM Gertie FeyMichelle Calixto Pavel, BS, RVT, RDCS, RDMS

## 2016-08-12 NOTE — Progress Notes (Addendum)
[facility] PROGRESS NOTE  Jeanne Barker ZOX:[id number] DOB: 1924-04-02 DOA: 08/11/2016  PCP: [name], MD  Brief History/Interval Summary: 83 year old African-American female with a past medical history of hypertension, hyperlipidemia, diabetes mellitus, hearing impairment, with history of right cochlear implant, chronic kidney disease stage III, presented with complaints of bilateral leg pain. Patient was found to have hyponatremia.  Reason for Visit: Bilateral leg pain. Hyponatremia  Consultants: None  Procedures: Arterial Dopplers/ABI pending  Antibiotics: Completing a course of Keflex  Subjective/Interval History: Patient is extremely hard of hearing despite hearing aid and cochlear implant. Very difficult to obtain any history from her. Tells me that her leg pain has been ongoing for a long time, although unable to specify this time period. Also complains of pain in her right knee.  ROS: Denies any nausea, vomiting  Objective:  Vital Signs  Vitals:   08/11/16 2330 08/12/16 0015 08/12/16 0347 08/12/16 0509  BP: (!) 165/83 (!) 139/42  (!) 161/62  Pulse: 65 74  72  Resp:  16  16  Temp:  97.5 F (36.4 C)  97.5 F (36.4 C)  TempSrc:      SpO2: 100% 97%  100%  Weight:   61 kg (134 lb 8 oz)   Height:   5' (1.524 m)     Intake/Output Summary (Last 24 hours) at 08/12/16 1125 Last data filed at 08/12/16 0910  Gross per 24 hour  Intake              360 ml  Output             1300 ml  Net             -940 ml   Filed Weights   08/11/16 1748 08/12/16 0347  Weight: 61.2 kg (135 lb) 61 kg (134 lb 8 oz)    General appearance: alert, cooperative, appears stated age and no distress Resp: clear to auscultation bilaterally Cardio: regular rate and rhythm, S1, S2 normal, no murmur, click, rub or gallop GI: soft, non-tender; bowel sounds normal; no masses,  no organomegaly Extremities: Both the lower extremities from knee down to her feet cold to palpation. Tender. No  obvious lesions are noted. Very difficult to palpate dorsalis pedis pulses on both feet. No erythema. Right knee is noted to be swollen. Restricted range of motion. Painful. Pulses: Poorly palpable in both lower extremities Neurologic: Awake and alert. Extremely hard of hearing. No obvious focal neurological deficits.  Lab Results:  Data Reviewed: I have personally reviewed following labs and imaging studies  CBC:  Recent Labs Lab 08/06/16 1013 08/11/16 2019 08/12/16 0448  WBC 5.6 5.3 5.6  NEUTROABS 3.3 3.2  --   HGB 9.9* 10.6* 11.9*  HCT 30.1* 31.5* 34.2*  MCV 83.4 80.2 80.7  PLT 254 268 260    Basic Metabolic Panel:  Recent Labs Lab 08/06/16 1822 08/06/16 2213 08/07/16 0138 08/11/16 2019 08/12/16 0448  NA 130* 129* 133* 118* 120*  K 5.2* 4.3 4.2 4.6 3.9  CL 97* 100* 103 87* 87*  CO2 23 22 23 22  21*  GLUCOSE 85 215* 139* 130* 204*  BUN 22* 21* 21* 15 13  CREATININE 1.56* 1.48* 1.45* 1.40* 1.23*  CALCIUM 9.2 8.5* 8.8* 9.3 9.1    GFR: Estimated Creatinine Clearance: 24.8 mL/min (A) (by C-G formula based on SCr of 1.23 mg/dL (H)).  Liver Function Tests:  Recent Labs Lab 08/06/16 1013  AST 18  ALT 10*  ALKPHOS 59  BILITOT  0.3  PROT 5.6*  ALBUMIN 3.1*    Coagulation Profile:  Recent Labs Lab 08/06/16 1013  INR 1.05    Cardiac Enzymes:  Recent Labs Lab 08/06/16 1642 08/11/16 2019  CKTOTAL  --  154  TROPONINI 0.03*  --     CBG:  Recent Labs Lab 08/07/16 1632 08/12/16 0023 08/12/16 0111 08/12/16 0206 08/12/16 0753  GLUCAP 135* 50* 55* 173* 129*    Thyroid Function Tests:  Recent Labs  08/12/16 0136  TSH 3.012     Radiology Studies: Ct Lumbar Spine Wo Contrast  Result Date: 08/12/2016 CLINICAL DATA:  Right leg pain for 4 days EXAM: CT LUMBAR SPINE WITHOUT CONTRAST TECHNIQUE: Multidetector CT imaging of the lumbar spine was performed without intravenous contrast administration. Multiplanar CT image reconstructions were also  generated. COMPARISON:  01/22/2016 FINDINGS: Segmentation: 5 lumbar type vertebra. Alignment: Mild scoliosis. Vertebrae: Vertebral body heights are normal. No fracture is visualized. Paraspinal and other soft tissues: Nonvisualization of the left kidney. Prominent right extrarenal pelvis. No paravertebral or paraspinal soft tissue abnormality. Fluid density in the anterior pelvis presumably represents a dilated bladder. Disc levels: Moderate severe multilevel degenerative disc changes with moderate severe narrowing at L1-L2 and L2-L3, associated vacuum disc and endplate sclerosis. Moderate degenerative changes at L3-L4, L4-L5 and L5-S1 with anterior osteophyte and vacuum discs. Mild canal stenosis suspected at L1-L2. Suspect moderate canal stenosis at L2-L3 due to disc disease and hypertrophic facet arthropathy. Broad-based disc at L3-L4 with suspected mild canal stenosis. Mild broad-based disc at L4-L5. Moderate broad based disc bulge at L5-S1. Mild right greater than left foraminal stenosis at L5-S1. IMPRESSION: 1. No acute osseous abnormality. 2. Moderate severe multilevel degenerative disc changes, most marked at L1-L2 and L2-L3. Moderate central and right paracentral disc bulging at L5-S1 with right greater than left foraminal narrowing. 3. Nonvisualization of the left kidney. Electronically Signed   By: Jasmine PangKim  Fujinaga M.D.   On: 08/12/2016 00:47     Medications:  Scheduled: . aspirin  81 mg Oral Daily  . cephALEXin  500 mg Oral Q12H  . cloNIDine  0.1 mg Oral BID  . heparin  5,000 Units Subcutaneous Q8H  . insulin aspart  0-5 Units Subcutaneous QHS  . insulin aspart  0-9 Units Subcutaneous TID WC  . simvastatin  40 mg Oral q1800  . sodium chloride flush  3 mL Intravenous Q12H  . vitamin B-12  100 mcg Oral Daily   Continuous: . sodium chloride 50 mL/hr at 08/12/16 [id number]   RUE:[id number]:acetaminophen, hydrALAZINE, ondansetron **OR** ondansetron (ZOFRAN) IV, oxyCODONE-acetaminophen,  zolpidem  Assessment/Plan:  Principal Problem:   Leg pain Active Problems:   DMII (diabetes mellitus, type 2) (HCC)   CKD (chronic kidney disease) stage 4, GFR 15-29 ml/min (HCC)   Essential hypertension   Hyperlipidemia   Hyponatremia     Bilateral lower extremity pain. Etiology is unclear, but this could be ischemic pain. Pulses are very poorly palpable in the lower extremities but were detected by Doppler in the emergency department. CT scan of the lumbar spine was done. Report reviewed. Moderate degenerative disc disease noted and disc bulging was noted, but unlikely to be contributing to her current presentation. Arterial Doppler/ABI is pending. May need to get vascular input. The patient may not be really a candidate for aggressive interventions.  Severe hyponatremia Patient had similar finding during her last hospitalization. She was given fluids with improvement. It was felt that diuretics with the reason for her low sodium. However, her diuretics were resumed  at discharge. She comes back again with low sodium level. Hold furosemide for now. Continue IV fluids. Recheck sodium levels periodically. Urine osmolality is pending.  Right Knee Pain Obtain x-ray.  Diabetes mellitus type 2 with episodes of hypoglycemia HbA1c was 7.3 in April. Continue sliding scale insulin. Holding her oral agents. Discontinue nighttime coverage due to low glucose in AM. Will consider stopping Amaryl at discharge.  Chronic kidney disease stage II Renal function is stable and at baseline. Continue to monitor urine output.  Essential hypertension Monitor blood pressures. Continue clonidine.  Recent UTI Seems to be improving. Will complete course of Keflex in 2 days.  Hyperlipidemia Continue statin.  DVT Prophylaxis: Subcutaneous heparin    Code Status: Full code  Family Communication: Discussed with patient. No family at bedside.  Disposition Plan: Management as outlined above. Await Doppler  studies. Await x-ray of the right knee. Will need to involve physical therapy eventually.   LOS: 0 days   [facility]  [facility] Pager [phone] 08/12/2016, 11:25 AM  If 7PM-7AM, please contact night-coverage at [email or web address], password [facility]

## 2016-08-13 LAB — GLUCOSE, CAPILLARY
GLUCOSE-CAPILLARY: 102 mg/dL — AB (ref 65–99)
Glucose-Capillary: 78 mg/dL (ref 65–99)
Glucose-Capillary: 169 mg/dL — ABNORMAL HIGH (ref 65–99)
Glucose-Capillary: 107 mg/dL — ABNORMAL HIGH (ref 65–99)

## 2016-08-13 LAB — BASIC METABOLIC PANEL
ANION GAP: 12 (ref 5–15)
ANION GAP: 7 (ref 5–15)
Anion gap: 9 (ref 5–15)
BUN: 11 mg/dL (ref 6–20)
BUN: 13 mg/dL (ref 6–20)
BUN: 15 mg/dL (ref 6–20)
CALCIUM: 8.8 mg/dL — AB (ref 8.9–10.3)
CALCIUM: 8.9 mg/dL (ref 8.9–10.3)
CALCIUM: 9.2 mg/dL (ref 8.9–10.3)
CO2: 18 mmol/L — ABNORMAL LOW (ref 22–32)
CO2: 20 mmol/L — AB (ref 22–32)
CO2: 23 mmol/L (ref 22–32)
CREATININE: 1.26 mg/dL — AB (ref 0.44–1.00)
Chloride: 92 mmol/L — ABNORMAL LOW (ref 101–111)
Chloride: 92 mmol/L — ABNORMAL LOW (ref 101–111)
Chloride: 94 mmol/L — ABNORMAL LOW (ref 101–111)
Creatinine, Ser: 1.09 mg/dL — ABNORMAL HIGH (ref 0.44–1.00)
Creatinine, Ser: 1.17 mg/dL — ABNORMAL HIGH (ref 0.44–1.00)
GFR calc Af Amer: 46 mL/min — ABNORMAL LOW (ref >60)
GFR, EST AFRICAN AMERICAN: 50 mL/min — AB (ref >60)
GFR, EST AFRICAN AMERICAN: 42 mL/min — AB (ref >60)
GFR, EST NON AFRICAN AMERICAN: 36 mL/min — AB (ref >60)
GFR, EST NON AFRICAN AMERICAN: 40 mL/min — AB (ref >60)
GFR, EST NON AFRICAN AMERICAN: 43 mL/min — AB (ref >60)
GLUCOSE: 117 mg/dL — AB (ref 65–99)
GLUCOSE: 133 mg/dL — AB (ref 65–99)
Glucose, Bld: 120 mg/dL — ABNORMAL HIGH (ref 65–99)
Potassium: 4.7 mmol/L (ref 3.5–5.1)
Potassium: 4.7 mmol/L (ref 3.5–5.1)
Potassium: 5 mmol/L (ref 3.5–5.1)
SODIUM: 122 mmol/L — AB (ref 135–145)
SODIUM: 122 mmol/L — AB (ref 135–145)
SODIUM: 123 mmol/L — AB (ref 135–145)

## 2016-08-13 MED ORDER — SODIUM CHLORIDE 0.9 % IV BOLUS (SEPSIS)
250.0000 mL | Freq: Once | INTRAVENOUS | Status: AC
  Administered 2016-08-13: 250 mL via INTRAVENOUS

## 2016-08-13 NOTE — Progress Notes (Signed)
[facility] PROGRESS NOTE  Jeanne Barker ZOX:[id number] DOB: 04-21-1923 DOA: 08/11/2016  PCP: [name], MD  Brief History/Interval Summary: 83 year old African-American female with a past medical history of hypertension, hyperlipidemia, diabetes mellitus, hearing impairment, with history of right cochlear implant, chronic kidney disease stage III, presented with complaints of bilateral leg pain. Patient was found to have hyponatremia.  Reason for Visit: Bilateral leg pain. Hyponatremia  Consultants: None  Procedures:  Arterial Dopplers/ABI Patient could not tolerate ABI measurements due to pain. TBI was 0.97 on right and 0.91 on left.  Antibiotics: Completing a course of Keflex  Subjective/Interval History: Patient is extremely hard of hearing despite hearing aid/cochlear implant. Her daughter is at the bedside today. Patient was given Ambien very late in the night. So, she is still sleeping.   ROS: Unable to do today  Objective:  Vital Signs  Vitals:   08/12/16 0509 08/12/16 1518 08/12/16 2213 08/13/16 0514  BP: (!) 161/62 (!) 165/67 (!) 186/61 (!) 121/46  Pulse: 72 72 78 80  Resp: 16 (!) 24 16 17   Temp: 97.5 F (36.4 C) 97.4 F (36.3 C) 98.1 F (36.7 C) 98.1 F (36.7 C)  TempSrc:   Oral Oral  SpO2: 100% 100% 100% 100%  Weight:      Height:        Intake/Output Summary (Last 24 hours) at 08/13/16 0916 Last data filed at 08/12/16 2130  Gross per 24 hour  Intake                0 ml  Output              800 ml  Net             -800 ml   Filed Weights   08/11/16 1748 08/12/16 0347  Weight: 61.2 kg (135 lb) 61 kg (134 lb 8 oz)    General appearance: Sleeping this morning. Had a restless night, so she was not woken up Resp: Clear to auscultation bilaterally Cardio: S1, S2 is normal, regular. No S3, S4. No rubs or so, bruit GI: Abdomen is soft. Nontender, nondistended. Bowel sounds are present. No masses or organomegaly Extremities: Lower extremities  without any significant change from yesterday. Once again, no obvious lesions identified.  Pulses: Poorly palpable in both lower extremities Neurologic:  No obvious focal neurological deficits.  Lab Results:  Data Reviewed: I have personally reviewed following labs and imaging studies  CBC:  Recent Labs Lab 08/06/16 1013 08/11/16 2019 08/12/16 0448  WBC 5.6 5.3 5.6  NEUTROABS 3.3 3.2  --   HGB 9.9* 10.6* 11.9*  HCT 30.1* 31.5* 34.2*  MCV 83.4 80.2 80.7  PLT 254 268 260    Basic Metabolic Panel:  Recent Labs Lab 08/11/16 2019 08/12/16 0448 08/12/16 1500 08/12/16 2341 08/13/16 0357  NA 118* 120* 123* 122* 122*  K 4.6 3.9 4.6 5.0 4.7  CL 87* 87* 91* 92* 92*  CO2 22 21* 21* 23 18*  GLUCOSE 130* 204* 188* 133* 117*  BUN 15 13 12 11 13   CREATININE 1.40* 1.23* 1.15* 1.17* 1.09*  CALCIUM 9.3 9.1 9.2 9.2 8.8*    GFR: Estimated Creatinine Clearance: 28 mL/min (A) (by C-G formula based on SCr of 1.09 mg/dL (H)).  Liver Function Tests:  Recent Labs Lab 08/06/16 1013  AST 18  ALT 10*  ALKPHOS 59  BILITOT 0.3  PROT 5.6*  ALBUMIN 3.1*    Coagulation Profile:  Recent Labs Lab 08/06/16 1013  INR 1.05    Cardiac Enzymes:  Recent Labs Lab 08/06/16 1642 08/11/16 2019  CKTOTAL  --  154  TROPONINI 0.03*  --     CBG:  Recent Labs Lab 08/12/16 0753 08/12/16 1517 08/12/16 1705 08/12/16 2206 08/13/16 0801  GLUCAP 129* 187* 236* 154* 102*    Thyroid Function Tests:  Recent Labs  08/12/16 0136  TSH 3.012     Radiology Studies: Dg Knee 1-2 Views Right  Result Date: 08/12/2016 CLINICAL DATA:  Right knee pain and swelling.  No known injury. EXAM: RIGHT KNEE - 1-2 VIEW COMPARISON:  None. FINDINGS: No acute bony or joint abnormality seen. No joint effusion. Chondrocalcinosis is seen. Tricompartmental osteophytosis is present with only mild to moderate joint space narrowing most notable in the lateral compartment. Atherosclerosis is identified.  IMPRESSION: No acute abnormality. Mild to moderate osteoarthritis about the knee. Atherosclerosis Electronically Signed   By: Drusilla Kannerhomas  Dalessio M.D.   On: 08/12/2016 13:39   Ct Lumbar Spine Wo Contrast  Result Date: 08/12/2016 CLINICAL DATA:  Right leg pain for 4 days EXAM: CT LUMBAR SPINE WITHOUT CONTRAST TECHNIQUE: Multidetector CT imaging of the lumbar spine was performed without intravenous contrast administration. Multiplanar CT image reconstructions were also generated. COMPARISON:  01/22/2016 FINDINGS: Segmentation: 5 lumbar type vertebra. Alignment: Mild scoliosis. Vertebrae: Vertebral body heights are normal. No fracture is visualized. Paraspinal and other soft tissues: Nonvisualization of the left kidney. Prominent right extrarenal pelvis. No paravertebral or paraspinal soft tissue abnormality. Fluid density in the anterior pelvis presumably represents a dilated bladder. Disc levels: Moderate severe multilevel degenerative disc changes with moderate severe narrowing at L1-L2 and L2-L3, associated vacuum disc and endplate sclerosis. Moderate degenerative changes at L3-L4, L4-L5 and L5-S1 with anterior osteophyte and vacuum discs. Mild canal stenosis suspected at L1-L2. Suspect moderate canal stenosis at L2-L3 due to disc disease and hypertrophic facet arthropathy. Broad-based disc at L3-L4 with suspected mild canal stenosis. Mild broad-based disc at L4-L5. Moderate broad based disc bulge at L5-S1. Mild right greater than left foraminal stenosis at L5-S1. IMPRESSION: 1. No acute osseous abnormality. 2. Moderate severe multilevel degenerative disc changes, most marked at L1-L2 and L2-L3. Moderate central and right paracentral disc bulging at L5-S1 with right greater than left foraminal narrowing. 3. Nonvisualization of the left kidney. Electronically Signed   By: Jasmine PangKim  Fujinaga M.D.   On: 08/12/2016 00:47     Medications:  Scheduled: . aspirin  81 mg Oral Daily  . cephALEXin  500 mg Oral Q12H  .  cloNIDine  0.1 mg Oral BID  . heparin  5,000 Units Subcutaneous Q8H  . insulin aspart  0-9 Units Subcutaneous TID WC  . simvastatin  40 mg Oral q1800  . sodium chloride flush  3 mL Intravenous Q12H  . vitamin B-12  100 mcg Oral Daily   Continuous: . sodium chloride 50 mL/hr at 08/12/16 0942  . sodium chloride     WUJ:[id number]:acetaminophen, hydrALAZINE, ondansetron **OR** ondansetron (ZOFRAN) IV, oxyCODONE-acetaminophen, zolpidem  Assessment/Plan:  Principal Problem:   Leg pain Active Problems:   DMII (diabetes mellitus, type 2) (HCC)   CKD (chronic kidney disease) stage 4, GFR 15-29 ml/min (HCC)   Essential hypertension   Hyperlipidemia   Hyponatremia     Bilateral lower extremity pain. Etiology of the pain is unclear. Pain appears to be mostly localized to the skin. She appears to have some element of hyperalgesia. She could not tolerate having ABIs checked, but her TBI were normal. This makes ischemia as an  etiology to be very less likely. CT scan of the lumbar spine was done. Report reviewed. Moderate degenerative disc disease noted and disc bulging was noted, but unlikely to be contributing to her current presentation. Do not anticipate any further workup at this time.  Severe hyponatremia Patient has had hyponatremia previously. Her sodium level has not been this low. Unfortunately, urine osmolality not checked so far. Sodium level did improve some with hydration. Continue normal saline infusion. Continue to hold diuretics. Check cortisol level in the morning. TSH was normal.   Right Knee Pain X-ray did not show any fracture. Arthritis was noted.  Diabetes mellitus type 2 with episodes of hypoglycemia HbA1c was 7.3 in April. Has not had any further episodes of hypoglycemia. Continue to hold her oral agents. Continue sliding scale coverage. Consider stopping Amaryl at discharge.   Chronic kidney disease stage II Renal function is stable. Continue to monitor urine  output.  Essential hypertension Monitor blood pressures. Continue clonidine.  Recent UTI Seems to be improving. Will complete course of Keflex.  Hyperlipidemia Continue statin.  DVT Prophylaxis: Subcutaneous heparin    Code Status: Full code  Family Communication: Discussed with patient's daughter at bedside. Disposition Plan: Management as outlined above. Mobilize with physical therapy. Monitor sodium levels.    LOS: 1 day   [facility]  [facility] Pager [phone] 08/13/2016, 9:16 AM  If 7PM-7AM, please contact night-coverage at [email or web address], password [facility]

## 2016-08-13 NOTE — Evaluation (Signed)
Physical Therapy Evaluation Patient Details Name: Jeanne Barker MRN: [id number] DOB: 06/28/1923 Today's Date: 08/13/2016   History of Present Illness  Pt adm with bil leg pain of unknown etiology. PMH - DM, HOH with cochlear implant, OA, HTN, CKD  Clinical Impression  Pt admitted with above diagnosis and presents to PT with functional limitations due to deficits listed below (See PT problem list). Pt needs skilled PT to maximize independence and safety to allow discharge to home with family. Pt able to amb in hallway with min guard assist and will have assist at home. Expect pt will make steady progress.     Follow Up Recommendations Home health PT;Supervision/Assistance - 24 hour    Equipment Recommendations  None recommended by PT    Recommendations for Other Services       Precautions / Restrictions Precautions Precautions: Fall Restrictions Weight Bearing Restrictions: No      Mobility  Bed Mobility Overal bed mobility: Needs Assistance Bed Mobility: Supine to Sit     Supine to sit: Min assist     General bed mobility comments: Assist to elevate trunk. Likely would have eventually been able to without assist with extended time.  Transfers Overall transfer level: Needs assistance Equipment used: Rolling walker (2 wheeled) Transfers: Sit to/from Stand Sit to Stand: Min assist         General transfer comment: Assist to bring hips up and for balance  Ambulation/Gait Ambulation/Gait assistance: Min guard Ambulation Distance (Feet): 125 Feet Assistive device: Rolling walker (2 wheeled) Gait Pattern/deviations: Step-through pattern;Decreased step length - right;Decreased step length - left;Trunk flexed Gait velocity: decr Gait velocity interpretation: <1.8 ft/sec, indicative of risk for recurrent falls General Gait Details: Assist for balance and safety.  Stairs            Wheelchair Mobility    Modified Rankin (Stroke Patients Only)       Balance  Overall balance assessment: Needs assistance Sitting-balance support: No upper extremity supported;Feet supported Sitting balance-Leahy Scale: Good     Standing balance support: Bilateral upper extremity supported Standing balance-Leahy Scale: Poor Standing balance comment: UE support                             Pertinent Vitals/Pain Pain Assessment: Faces Faces Pain Scale: Hurts little more Pain Location: BLE's with initial movement but then none Pain Descriptors / Indicators: Grimacing Pain Intervention(s): Limited activity within patient's tolerance;Monitored during session    Home Living Family/patient expects to be discharged to:: Private residence Living Arrangements: Children (daughter stays with ) Available Help at Discharge: Family;Available 24 hours/day Type of Home: House Home Access: Level entry     Home Layout: One level Home Equipment: Walker - 2 wheels;Cane - single point;Shower seat;Grab bars - tub/shower      Prior Function Level of Independence: Independent with assistive device(s)               Hand Dominance        Extremity/Trunk Assessment   Upper Extremity Assessment Upper Extremity Assessment: Defer to OT evaluation    Lower Extremity Assessment Lower Extremity Assessment: Generalized weakness    Cervical / Trunk Assessment Cervical / Trunk Assessment: Kyphotic  Communication   Communication: HOH (cochlear implant)  Cognition Arousal/Alertness: Awake/alert Behavior During Therapy: WFL for tasks assessed/performed Overall Cognitive Status: History of cognitive impairments - at baseline  General Comments      Exercises     Assessment/Plan    PT Assessment Patient needs continued PT services  PT Problem List Decreased strength;Decreased activity tolerance;Decreased balance;Decreased mobility;Pain       PT Treatment Interventions DME instruction;Gait  training;Functional mobility training;Therapeutic activities;Therapeutic exercise;Balance training;Patient/family education    PT Goals (Current goals can be found in the Care Plan section)  Acute Rehab PT Goals Patient Stated Goal: to go home PT Goal Formulation: With patient Time For Goal Achievement: 08/20/16 Potential to Achieve Goals: Good    Frequency Min 3X/week   Barriers to discharge        Co-evaluation               AM-PAC PT "6 Clicks" Daily Activity  Outcome Measure Difficulty turning over in bed (including adjusting bedclothes, sheets and blankets)?: None Difficulty moving from lying on back to sitting on the side of the bed? : A Little Difficulty sitting down on and standing up from a chair with arms (e.g., wheelchair, bedside commode, etc,.)?: Total Help needed moving to and from a bed to chair (including a wheelchair)?: A Little Help needed walking in hospital room?: A Little Help needed climbing 3-5 steps with a railing? : A Lot 6 Click Score: 16    End of Session Equipment Utilized During Treatment: Gait belt Activity Tolerance: Patient tolerated treatment well Patient left: in chair;with call bell/phone within reach;with chair alarm set Nurse Communication: Mobility status PT Visit Diagnosis: Unsteadiness on feet (R26.81);Difficulty in walking, not elsewhere classified (R26.2)    Time: 1610-9604 PT Time Calculation (min) (ACUTE ONLY): 30 min   Charges:   PT Evaluation $PT Eval Moderate Complexity: 1 Procedure PT Treatments $Gait Training: 8-22 mins   PT G Codes:        [facility] PT [phone]   Angelina Ok [facility] 08/13/2016, 3:59 PM

## 2016-08-14 LAB — CORTISOL-AM, BLOOD: Cortisol - AM: 6.7 ug/dL (ref 6.7–22.6)

## 2016-08-14 LAB — CBC
HEMATOCRIT: 26.7 % — AB (ref 36.0–46.0)
Hemoglobin: 8.7 g/dL — ABNORMAL LOW (ref 12.0–15.0)
MCH: 26.7 pg (ref 26.0–34.0)
MCHC: 32.6 g/dL (ref 30.0–36.0)
MCV: 81.9 fL (ref 78.0–100.0)
Platelets: 237 10*3/uL (ref 150–400)
RBC: 3.26 MIL/uL — AB (ref 3.87–5.11)
RDW: 13.1 % (ref 11.5–15.5)
WBC: 5.1 10*3/uL (ref 4.0–10.5)

## 2016-08-14 LAB — BASIC METABOLIC PANEL
Anion gap: 9 (ref 5–15)
BUN: 20 mg/dL (ref 6–20)
CHLORIDE: 95 mmol/L — AB (ref 101–111)
CO2: 20 mmol/L — AB (ref 22–32)
CREATININE: 1.37 mg/dL — AB (ref 0.44–1.00)
Calcium: 8.4 mg/dL — ABNORMAL LOW (ref 8.9–10.3)
GFR calc Af Amer: 38 mL/min — ABNORMAL LOW (ref >60)
GFR calc non Af Amer: 33 mL/min — ABNORMAL LOW (ref >60)
GLUCOSE: 221 mg/dL — AB (ref 65–99)
POTASSIUM: 5.1 mmol/L (ref 3.5–5.1)
SODIUM: 124 mmol/L — AB (ref 135–145)

## 2016-08-14 LAB — GLUCOSE, CAPILLARY
GLUCOSE-CAPILLARY: 196 mg/dL — AB (ref 65–99)
GLUCOSE-CAPILLARY: 159 mg/dL — AB (ref 65–99)
GLUCOSE-CAPILLARY: 170 mg/dL — AB (ref 65–99)
Glucose-Capillary: 171 mg/dL — ABNORMAL HIGH (ref 65–99)

## 2016-08-14 LAB — SODIUM, URINE, RANDOM: Sodium, Ur: 69 mmol/L

## 2016-08-14 LAB — OSMOLALITY, URINE: Osmolality, Ur: 268 mOsm/kg — ABNORMAL LOW (ref 300–900)

## 2016-08-14 MED ORDER — SODIUM CHLORIDE 0.9 % IV SOLN
INTRAVENOUS | Status: AC
  Administered 2016-08-14 – 2016-08-15 (×3): via INTRAVENOUS

## 2016-08-14 MED ORDER — COSYNTROPIN 0.25 MG IJ SOLR
0.2500 mg | Freq: Once | INTRAMUSCULAR | Status: AC
  Administered 2016-08-15: 0.25 mg via INTRAVENOUS
  Filled 2016-08-14: qty 0.25

## 2016-08-14 NOTE — Progress Notes (Signed)
[facility] PROGRESS NOTE  Jeanne RammingRuth B Barker WUJ:[id number] DOB: 12/28/1923 DOA: 08/11/2016  PCP: Wanda PlumpPaz, Jose E, MD  Brief History/Interval Summary: 83 year old African-American female with a past medical history of hypertension, hyperlipidemia, diabetes mellitus, hearing impairment, with history of right cochlear implant, chronic kidney disease stage III, presented with complaints of bilateral leg pain. Patient was found to have hyponatremia.  Reason for Visit: Hyponatremia  Consultants: None  Procedures:  Arterial Dopplers/ABI Patient could not tolerate ABI measurements due to pain. TBI was 0.97 on right and 0.91 on left.  Antibiotics: Completed a course of Keflex  Subjective/Interval History: Patient is extremely hard of hearing despite hearing aid/cochlear implant. States that she is feeling better. Continues to have some pain in her legs   ROS: Denies any nausea or vomiting  Objective:  Vital Signs  Vitals:   08/12/16 2213 08/13/16 0514 08/13/16 2156 08/14/16 0455  BP: (!) 186/61 (!) 121/46 (!) 163/60 (!) 143/44  Pulse: 78 80 94 85  Resp: 16 17 18 18   Temp: 98.1 F (36.7 C) 98.1 F (36.7 C) 99.1 F (37.3 C) 98.7 F (37.1 C)  TempSrc: Oral Oral Oral Oral  SpO2: 100% 100% 100% 100%  Weight:      Height:       No intake or output data in the 24 hours ending 08/14/16 0902 Filed Weights   08/11/16 1748 08/12/16 0347  Weight: 61.2 kg (135 lb) 61 kg (134 lb 8 oz)    General appearance: Awake and alert today. No distress Resp: Normal effort. Clear to auscultation bilaterally Cardio: S1, S2 is normal, regular. No S3, S4. No rubs, murmurs or bruits GI: Abdomen is soft. Nontender, nondistended. Bowel sounds are present. No masses or organomegaly. Extremities: No lesions identified on bilateral lower extremity. Very sensitive to even light touch.   Pulses: Poorly palpable in both lower extremities Neurologic:  No obvious focal neurological deficits.  Lab  Results:  Data Reviewed: I have personally reviewed following labs and imaging studies  CBC:  Recent Labs Lab 08/11/16 2019 08/12/16 0448 08/14/16 0321  WBC 5.3 5.6 5.1  NEUTROABS 3.2  --   --   HGB 10.6* 11.9* 8.7*  HCT 31.5* 34.2* 26.7*  MCV 80.2 80.7 81.9  PLT 268 260 237    Basic Metabolic Panel:  Recent Labs Lab 08/12/16 1500 08/12/16 2341 08/13/16 0357 08/13/16 1847 08/14/16 0321  NA 123* 122* 122* 123* 124*  K 4.6 5.0 4.7 4.7 5.1  CL 91* 92* 92* 94* 95*  CO2 21* 23 18* 20* 20*  GLUCOSE 188* 133* 117* 120* 221*  BUN 12 11 13 15 20   CREATININE 1.15* 1.17* 1.09* 1.26* 1.37*  CALCIUM 9.2 9.2 8.8* 8.9 8.4*    GFR: Estimated Creatinine Clearance: 21.8 mL/min (A) (by C-G formula based on SCr of 1.37 mg/dL (H)).   Cardiac Enzymes:  Recent Labs Lab 08/11/16 2019  CKTOTAL 154    CBG:  Recent Labs Lab 08/13/16 0801 08/13/16 1201 08/13/16 1658 08/13/16 2152 08/14/16 0819  GLUCAP 102* 78 169* 107* 196*    Thyroid Function Tests:  Recent Labs  08/12/16 0136  TSH 3.012     Radiology Studies: Dg Knee 1-2 Views Right  Result Date: 08/12/2016 CLINICAL DATA:  Right knee pain and swelling.  No known injury. EXAM: RIGHT KNEE - 1-2 VIEW COMPARISON:  None. FINDINGS: No acute bony or joint abnormality seen. No joint effusion. Chondrocalcinosis is seen. Tricompartmental osteophytosis is present with only mild to moderate joint space narrowing  most notable in the lateral compartment. Atherosclerosis is identified. IMPRESSION: No acute abnormality. Mild to moderate osteoarthritis about the knee. Atherosclerosis Electronically Signed   By: Drusilla Kanner M.D.   On: 08/12/2016 13:39     Medications:  Scheduled: . aspirin  81 mg Oral Daily  . cloNIDine  0.1 mg Oral BID  . [START ON 08/15/2016] cosyntropin  0.25 mg Intravenous Once  . heparin  5,000 Units Subcutaneous Q8H  . insulin aspart  0-9 Units Subcutaneous TID WC  . simvastatin  40 mg Oral q1800  .  sodium chloride flush  3 mL Intravenous Q12H  . vitamin B-12  100 mcg Oral Daily   Continuous: . sodium chloride     ZOX:[id number], hydrALAZINE, ondansetron **OR** ondansetron (ZOFRAN) IV, oxyCODONE-acetaminophen, zolpidem  Assessment/Plan:  Principal Problem:   Leg pain Active Problems:   DMII (diabetes mellitus, type 2) (HCC)   CKD (chronic kidney disease) stage 4, GFR 15-29 ml/min (HCC)   Essential hypertension   Hyperlipidemia   Hyponatremia     Bilateral lower extremity pain. Etiology of the pain is unclear. Pain appears to be mostly localized to the skin. She appears to have some element of hyperalgesia. She could not tolerate having ABIs checked, but her TBI were normal. This makes ischemia as an etiology to be very less likely. CT scan of the lumbar spine was done. Report reviewed. Moderate degenerative disc disease noted and disc bulging was noted, but unlikely to be contributing to her current presentation. Do not anticipate any further workup at this time.  Severe hyponatremia Sodium level slightly improved. Continue IV fluids for now but at lower rate. Patient has had hyponatremia previously. Her sodium level has not been this low. Urine studies were finely sent this morning. Results should be interpreted with caution due to the fact that she has been on IV fluids. Urine osmolality is 268. Serum osmolality was 256, but 2 days ago. Urine sodium 69. No clear diagnosis based on the studies. Cortisol level borderline low. Potassium noted to be slightly elevated. Bicarbonate low. We will do an ACTH stimulation test. TSH was normal. Continue to hold diuretics.   Right Knee Pain X-ray did not show any fracture. Arthritis was noted.  Diabetes mellitus type 2 with episodes of hypoglycemia HbA1c was 7.3 in April. No further episodes of hypoglycemia. Continue to hold oral agents. Continue sliding scale coverage. Consider stopping Amaryl at discharge.   Normocytic Anemia Drop  in hemoglobin is noted. No overt bleeding identified. This could be dilutional drop. Continue to monitor.  Chronic kidney disease stage II Renal function is stable. Continue to monitor urine output.  Essential hypertension Monitor blood pressures. Continue clonidine.  Recent UTI Seems to be improving. She has completed a course of Keflex.  Hyperlipidemia Continue statin.  DVT Prophylaxis: Subcutaneous heparin    Code Status: Full code  Family Communication: Discussed with patient Disposition Plan: Management as outlined above.    LOS: 2 days   [facility]  [facility] Pager [phone] 08/14/2016, 9:02 AM  If 7PM-7AM, please contact night-coverage at [email or web address], password [facility]

## 2016-08-14 NOTE — Care Management Note (Signed)
Case Management Note  Patient Details  Name: Jeanne Barker MRN: [id number] Date of Birth: 10/29/1923  Subjective/Objective:    Admitted with bil leg pain of unknown etiology. PMH - DM, HOH with cochlear implant, OA, HTN, CKD Recently admitted 4/28-4/29/2018  for Acute encephalopathy secondary to uti .              Clayborne DanaMollie Woods (Daughter)     [phone]       Action/Plan:  Discharged planning in process. CM to f/u with d/c needs.  Expected Discharge Date:  08/12/16               Expected Discharge Plan:  Home w Home Health Services  In-House Referral:     Discharge planning Services  CM Consult  Post Acute Care Choice:    Choice offered to:     DME Arranged:    DME Agency:     HH Arranged:    HH Agency:     Status of Service:  In process, will continue to follow  If discussed at Long Length of Stay Meetings, dates discussed:    Additional Comments:  [name], [name] [name], RN 08/14/2016, 9:32 PM

## 2016-08-14 NOTE — Evaluation (Signed)
Occupational Therapy Evaluation Patient Details Name: Jeanne Barker MRN: [id number] DOB: Aug 06, 1923 Today's Date: 08/14/2016    History of Present Illness Pt adm with bil leg pain of unknown etiology. PMH - DM, HOH with cochlear implant, OA, HTN, CKD   Clinical Impression   PTA, pt was independent with RW for basic ADL and functional mobility. She reports that she was able to work on Friday with no difficulties (she works for the city doing cleaning tasks). Pt limited by B LE pain and generalized weakness this session requiring min assist for standing grooming tasks and LB ADL as well as toilet transfers. She additionally demonstrates decreased activity tolerance for ADL. She would benefit from home health OT services post-acute D/C in order to improve independence with ADL and functional mobility. OT will continue to follow acutely.    Follow Up Recommendations  Home health OT;Supervision/Assistance - 24 hour    Equipment Recommendations  None recommended by OT    Recommendations for Other Services       Precautions / Restrictions Precautions Precautions: Fall Restrictions Weight Bearing Restrictions: No      Mobility Bed Mobility Overal bed mobility: Needs Assistance Bed Mobility: Supine to Sit;Sit to Supine     Supine to sit: Min guard Sit to supine: Min assist   General bed mobility comments: Min assist on return to bed to manage B LE's.  Transfers Overall transfer level: Needs assistance Equipment used: Rolling walker (2 wheeled) Transfers: Sit to/from Stand Sit to Stand: Min assist         General transfer comment: Steadying assist and assist for initial power up.    Balance Overall balance assessment: Needs assistance Sitting-balance support: No upper extremity supported;Feet supported Sitting balance-Leahy Scale: Good     Standing balance support: Bilateral upper extremity supported;Single extremity supported;During functional activity Standing  balance-Leahy Scale: Poor Standing balance comment: Reliant on UE support at sink during ADL.                           ADL either performed or assessed with clinical judgement   ADL Overall ADL's : Needs assistance/impaired Eating/Feeding: Set up;Sitting   Grooming: Minimal assistance;Standing   Upper Body Bathing: Set up;Sitting   Lower Body Bathing: Minimal assistance;Sit to/from stand   Upper Body Dressing : Set up;Sitting   Lower Body Dressing: Minimal assistance;Sit to/from stand   Toilet Transfer: Minimal assistance;Ambulation;RW   Toileting- Clothing Manipulation and Hygiene: Minimal assistance;Sit to/from stand       Functional mobility during ADLs: Minimal assistance;Rolling walker General ADL Comments: Min assist with RW for stability and safety with mobility. Pt able to stand at sink for approximately 5 minutes for grooming tasks.     Vision Baseline Vision/History: Wears glasses Wears Glasses: At all times Patient Visual Report: No change from baseline Vision Assessment?: No apparent visual deficits     Perception     Praxis      Pertinent Vitals/Pain Pain Assessment: Faces Faces Pain Scale: Hurts little more Pain Location: BLE's with movement and to touch Pain Descriptors / Indicators: Grimacing Pain Intervention(s): Limited activity within patient's tolerance;Monitored during session;Repositioned     Hand Dominance     Extremity/Trunk Assessment Upper Extremity Assessment Upper Extremity Assessment: Generalized weakness   Lower Extremity Assessment Lower Extremity Assessment: Generalized weakness   Cervical / Trunk Assessment Cervical / Trunk Assessment: Kyphotic   Communication Communication Communication: HOH (cochlear implant)   Cognition Arousal/Alertness: Awake/alert Behavior During  Therapy: WFL for tasks assessed/performed Overall Cognitive Status: Within Functional Limits for tasks assessed                                      General Comments       Exercises     Shoulder Instructions      Home Living Family/patient expects to be discharged to:: Private residence Living Arrangements: Children (daughters rotating who is staying with her) Available Help at Discharge: Family;Available 24 hours/day Type of Home: House Home Access: Level entry     Home Layout: One level     Bathroom Shower/Tub: Producer, television/film/videoWalk-in shower   Bathroom Toilet: Handicapped height Bathroom Accessibility: Yes   Home Equipment: Environmental consultantWalker - 2 wheels;Cane - single point;Shower seat;Grab bars - tub/shower          Prior Functioning/Environment Level of Independence: Independent with assistive device(s)        Comments: Using RW; works four hours per day        OT Problem List: Decreased strength;Decreased activity tolerance;Impaired balance (sitting and/or standing);Decreased safety awareness;Decreased knowledge of use of DME or AE;Decreased knowledge of precautions;Pain      OT Treatment/Interventions: Self-care/ADL training;Therapeutic exercise;Energy conservation;DME and/or AE instruction;Therapeutic activities;Patient/family education;Balance training    OT Goals(Current goals can be found in the care plan section) Acute Rehab OT Goals Patient Stated Goal: to go home OT Goal Formulation: With patient/family Time For Goal Achievement: 08/28/16 Potential to Achieve Goals: Good ADL Goals Pt Will Perform Grooming: with modified independence;standing Pt Will Perform Upper Body Dressing: with modified independence;sitting Pt Will Perform Lower Body Dressing: with modified independence;sit to/from stand (including gathering items) Pt Will Transfer to Toilet: with modified independence;ambulating;bedside commode (BSC over toilet) Pt Will Perform Toileting - Clothing Manipulation and hygiene: with modified independence;sit to/from stand Pt Will Perform Tub/Shower Transfer: with modified  independence;ambulating;Shower transfer;rolling walker Pt/caregiver will Perform Home Exercise Program: Increased strength;Both right and left upper extremity;With written HEP provided;Independently  OT Frequency: Min 2X/week   Barriers to D/C:            Co-evaluation              AM-PAC PT "6 Clicks" Daily Activity     Outcome Measure Help from another person eating meals?: None Help from another person taking care of personal grooming?: A Little Help from another person toileting, which includes using toliet, bedpan, or urinal?: A Little Help from another person bathing (including washing, rinsing, drying)?: A Little Help from another person to put on and taking off regular upper body clothing?: None Help from another person to put on and taking off regular lower body clothing?: A Little 6 Click Score: 20   End of Session Equipment Utilized During Treatment: Gait belt;Rolling walker Nurse Communication: Mobility status  Activity Tolerance: Patient tolerated treatment well Patient left: in bed;with call bell/phone within reach;with family/visitor present  OT Visit Diagnosis: Unsteadiness on feet (R26.81);Muscle weakness (generalized) (M62.81);Pain Pain - Right/Left:  (Bilateral) Pain - part of body: Leg                Time: 1450-1515 OT Time Calculation (min): 25 min Charges:  OT General Charges $OT Visit: 1 Procedure OT Evaluation $OT Eval Moderate Complexity: 1 Procedure OT Treatments $Self Care/Home Management : 8-22 mins G-Codes:     Doristine Sectionharity A Latangela Mccomas, MS OTR/L  Pager: [phone]   Afnan Cadiente A Armani Gawlik 08/14/2016, 4:03 PM

## 2016-08-15 ENCOUNTER — Inpatient Hospital Stay (HOSPITAL_COMMUNITY): Payer: Medicare Other

## 2016-08-15 DIAGNOSIS — E875 Hyperkalemia: Secondary | ICD-10-CM

## 2016-08-15 LAB — CBC
HCT: 26.2 % — ABNORMAL LOW (ref 36.0–46.0)
HEMOGLOBIN: 8.6 g/dL — AB (ref 12.0–15.0)
MCH: 27.3 pg (ref 26.0–34.0)
MCHC: 32.8 g/dL (ref 30.0–36.0)
MCV: 83.2 fL (ref 78.0–100.0)
Platelets: 244 10*3/uL (ref 150–400)
RBC: 3.15 MIL/uL — AB (ref 3.87–5.11)
RDW: 13.7 % (ref 11.5–15.5)
WBC: 4.9 10*3/uL (ref 4.0–10.5)

## 2016-08-15 LAB — BASIC METABOLIC PANEL
ANION GAP: 6 (ref 5–15)
ANION GAP: 8 (ref 5–15)
BUN: 18 mg/dL (ref 6–20)
BUN: 16 mg/dL (ref 6–20)
CALCIUM: 8.6 mg/dL — AB (ref 8.9–10.3)
CALCIUM: 9.1 mg/dL (ref 8.9–10.3)
CO2: 20 mmol/L — AB (ref 22–32)
CO2: 22 mmol/L (ref 22–32)
CREATININE: 1.46 mg/dL — AB (ref 0.44–1.00)
CREATININE: 1.51 mg/dL — AB (ref 0.44–1.00)
Chloride: 101 mmol/L (ref 101–111)
Chloride: 99 mmol/L — ABNORMAL LOW (ref 101–111)
GFR calc non Af Amer: 30 mL/min — ABNORMAL LOW (ref >60)
GFR calc non Af Amer: 29 mL/min — ABNORMAL LOW (ref >60)
GFR, EST AFRICAN AMERICAN: 35 mL/min — AB (ref >60)
GFR, EST AFRICAN AMERICAN: 34 mL/min — AB (ref >60)
Glucose, Bld: 118 mg/dL — ABNORMAL HIGH (ref 65–99)
Glucose, Bld: 247 mg/dL — ABNORMAL HIGH (ref 65–99)
Potassium: 5.7 mmol/L — ABNORMAL HIGH (ref 3.5–5.1)
Potassium: 5.5 mmol/L — ABNORMAL HIGH (ref 3.5–5.1)
SODIUM: 129 mmol/L — AB (ref 135–145)
Sodium: 127 mmol/L — ABNORMAL LOW (ref 135–145)

## 2016-08-15 LAB — FERRITIN: Ferritin: 45 ng/mL (ref 11–307)

## 2016-08-15 LAB — IRON AND TIBC
Iron: 64 ug/dL (ref 28–170)
SATURATION RATIOS: 25 % (ref 10.4–31.8)
TIBC: 253 ug/dL (ref 250–450)
UIBC: 189 ug/dL

## 2016-08-15 LAB — VITAMIN B12: VITAMIN B 12: 2246 pg/mL — AB (ref 180–914)

## 2016-08-15 LAB — GLUCOSE, CAPILLARY
GLUCOSE-CAPILLARY: 122 mg/dL — AB (ref 65–99)
GLUCOSE-CAPILLARY: 228 mg/dL — AB (ref 65–99)
GLUCOSE-CAPILLARY: 120 mg/dL — AB (ref 65–99)
Glucose-Capillary: 138 mg/dL — ABNORMAL HIGH (ref 65–99)

## 2016-08-15 LAB — RETICULOCYTES
RBC.: 3.15 MIL/uL — AB (ref 3.87–5.11)
RETIC CT PCT: 1.4 % (ref 0.4–3.1)
Retic Count, Absolute: 44.1 10*3/uL (ref 19.0–186.0)

## 2016-08-15 LAB — ACTH STIMULATION, 3 TIME POINTS
CORTISOL 30 MIN: 20.3 ug/dL
CORTISOL 60 MIN: 26.1 ug/dL
CORTISOL BASE: 7.4 ug/dL

## 2016-08-15 LAB — FOLATE: Folate: 10.3 ng/mL (ref >5.9)

## 2016-08-15 MED ORDER — SODIUM POLYSTYRENE SULFONATE 15 GM/60ML PO SUSP
30.0000 g | Freq: Once | ORAL | Status: AC
  Administered 2016-08-15: 30 g via ORAL
  Filled 2016-08-15: qty 120

## 2016-08-15 MED ORDER — SODIUM CHLORIDE 0.9 % IV SOLN
INTRAVENOUS | Status: AC
  Administered 2016-08-15 – 2016-08-16 (×2): via INTRAVENOUS

## 2016-08-15 NOTE — Progress Notes (Signed)
Physical Therapy Treatment Patient Details Name: Jeanne Barker MRN: [id number] DOB: 09-10-1923 Today's Date: 08/15/2016    History of Present Illness Pt adm with bil leg pain of unknown etiology. PMH - DM, HOH with cochlear implant, OA, HTN, CKD    PT Comments    Pt performed increased mobility and required min to min guard for safety during treatment.  Pt remains on track to return home with HHPT.  She is motivated to mobilize.  HOH.     Follow Up Recommendations  Home health PT;Supervision/Assistance - 24 hour     Equipment Recommendations  None recommended by PT    Recommendations for Other Services       Precautions / Restrictions Precautions Precautions: Fall Restrictions Weight Bearing Restrictions: No    Mobility  Bed Mobility Overal bed mobility: Needs Assistance Bed Mobility: Supine to Sit;Sit to Supine     Supine to sit: Min assist Sit to supine: Min guard   General bed mobility comments: Cues for technique and to assist to scoot forward.  Cues for scooting up into bed.    Transfers Overall transfer level: Needs assistance Equipment used: Rolling walker (2 wheeled) Transfers: Sit to/from Stand Sit to Stand: Min assist         General transfer comment: Steadying assist and assist for initial power up.  Ambulation/Gait Ambulation/Gait assistance: Min guard Ambulation Distance (Feet): 140 Feet Assistive device: Rolling walker (2 wheeled) Gait Pattern/deviations: Step-through pattern;Decreased step length - right;Decreased step length - left;Trunk flexed Gait velocity: decr   General Gait Details: Assist for balance and safety.   Stairs            Wheelchair Mobility    Modified Rankin (Stroke Patients Only)       Balance Overall balance assessment: Needs assistance Sitting-balance support: No upper extremity supported;Feet supported Sitting balance-Leahy Scale: Good       Standing balance-Leahy Scale: Poor                               Cognition Arousal/Alertness: Awake/alert Behavior During Therapy: WFL for tasks assessed/performed Overall Cognitive Status: Within Functional Limits for tasks assessed                                        Exercises      General Comments        Pertinent Vitals/Pain Pain Assessment: No/denies pain    Home Living                      Prior Function            PT Goals (current goals can now be found in the care plan section) Acute Rehab PT Goals Patient Stated Goal: to go home Potential to Achieve Goals: Good Progress towards PT goals: Progressing toward goals    Frequency    Min 3X/week      PT Plan Current plan remains appropriate    Co-evaluation              AM-PAC PT "6 Clicks" Daily Activity  Outcome Measure  Difficulty turning over in bed (including adjusting bedclothes, sheets and blankets)?: A Lot Difficulty moving from lying on back to sitting on the side of the bed? : A Little Difficulty sitting down on and standing up from a chair with  arms (e.g., wheelchair, bedside commode, etc,.)?: A Little Help needed moving to and from a bed to chair (including a wheelchair)?: A Little Help needed walking in hospital room?: A Little Help needed climbing 3-5 steps with a railing? : A Little 6 Click Score: 17    End of Session Equipment Utilized During Treatment: Gait belt Activity Tolerance: Patient tolerated treatment well Patient left: in chair;with call bell/phone within reach;with chair alarm set Nurse Communication: Mobility status PT Visit Diagnosis: Unsteadiness on feet (R26.81);Difficulty in walking, not elsewhere classified (R26.2)     Time: [id number] PT Time Calculation (min) (ACUTE ONLY): 20 min  Charges:  $Gait Training: 8-22 mins                    G Codes:       Joycelyn RuaAimee Bekah Igoe, PTA pager [phone]    Florestine AversAimee J Rosenda Geffrard 08/15/2016, 4:07 PM

## 2016-08-15 NOTE — Plan of Care (Signed)
Problem: Safety: Goal: Ability to remain free from injury will improve Outcome: Progressing No incidence of falls during this admission. Call bell within reach. Clean and clear environment maintained. Nonskid footwear in place. 3/4 siderails being utilized. Bed in low and locked position. Patient able to ambulate with FWW and standby assist well. Patient verbalized understanding of safety instruction.   Problem: Activity: Goal: Risk for activity intolerance will decrease Outcome: Progressing Patient able to ambulate with FWW and standby assist. Patient tolerates the activity well.

## 2016-08-15 NOTE — Progress Notes (Signed)
[facility] PROGRESS NOTE  Jeanne Barker ZOX:[id number] DOB: 07/09/1923 DOA: 08/11/2016  PCP: Jeanne PlumpPaz, Jeanne Barker  Brief History/Interval Summary: 83 year old African-American female with a past medical history of hypertension, hyperlipidemia, diabetes mellitus, hearing impairment, with history of right cochlear implant, chronic kidney disease stage III, presented with complaints of bilateral leg pain. Patient was found to have hyponatremia.  Reason for Visit: Hyponatremia  Consultants: None  Procedures:  Arterial Dopplers/ABI Patient could not tolerate ABI measurements due to pain. TBI was 0.97 on right and 0.91 on left.  Antibiotics: Completed a course of Keflex  Subjective/Interval History: Patient is extremely hard of hearing. She is feeling better though continues to have pain in her legs.   ROS: Denies any nausea or vomiting  Objective:  Vital Signs  Vitals:   08/14/16 1435 08/14/16 2206 08/15/16 0550 08/15/16 0843  BP: (!) 166/64 (!) 153/54 (!) 158/52 (!) 146/52  Pulse: 81 88 73   Resp: 18 18 18    Temp: 98 F (36.7 C) 98.6 F (37 C) 98.7 F (37.1 C)   TempSrc: Oral Oral    SpO2: 100% 98% 100%   Weight:      Height:        Intake/Output Summary (Last 24 hours) at 08/15/16 0948 Last data filed at 08/15/16 0700  Gross per 24 hour  Intake              900 ml  Output                0 ml  Net              900 ml   Filed Weights   08/11/16 1748 08/12/16 0347  Weight: 61.2 kg (135 lb) 61 kg (134 lb 8 oz)    General appearance: Awake and alert. No distress Resp: Clear to auscultation bilaterally. Normal effort Cardio: S1, S2 is normal, regular. No S3, S4. No rubs, murmurs or bruit GI: Abdomen is soft. Nontender, nondistended. Bowel sounds present. No masses or organomegaly Extremities: No lesions identified on both lower extremities. Very sensitive to even light touch. No swelling noted. No erythema. Pulses: Poorly palpable in both lower  extremities Neurologic:  Nonfocal neurological deficits  Lab Results:  Data Reviewed: I have personally reviewed following labs and imaging studies  CBC:  Recent Labs Lab 08/11/16 2019 08/12/16 0448 08/14/16 0321 08/15/16 0625  WBC 5.3 5.6 5.1 4.9  NEUTROABS 3.2  --   --   --   HGB 10.6* 11.9* 8.7* 8.6*  HCT 31.5* 34.2* 26.7* 26.2*  MCV 80.2 80.7 81.9 83.2  PLT 268 260 237 244    Basic Metabolic Panel:  Recent Labs Lab 08/12/16 2341 08/13/16 0357 08/13/16 1847 08/14/16 0321 08/15/16 0625  NA 122* 122* 123* 124* 127*  K 5.0 4.7 4.7 5.1 5.7*  CL 92* 92* 94* 95* 101  CO2 23 18* 20* 20* 20*  GLUCOSE 133* 117* 120* 221* 118*  BUN 11 13 15 20 18   CREATININE 1.17* 1.09* 1.26* 1.37* 1.46*  CALCIUM 9.2 8.8* 8.9 8.4* 8.6*    GFR: Estimated Creatinine Clearance: 20.5 mL/min (A) (by C-G formula based on SCr of 1.46 mg/dL (H)).   Cardiac Enzymes:  Recent Labs Lab 08/11/16 2019  CKTOTAL 154    CBG:  Recent Labs Lab 08/14/16 0819 08/14/16 1220 08/14/16 1750 08/14/16 2204 08/15/16 0824  GLUCAP 196* 159* 171* 170* 122*     Radiology Studies: No results found.   Medications:  Scheduled: .  aspirin  81 mg Oral Daily  . cloNIDine  0.1 mg Oral BID  . heparin  5,000 Units Subcutaneous Q8H  . insulin aspart  0-9 Units Subcutaneous TID WC  . simvastatin  40 mg Oral q1800  . sodium chloride flush  3 mL Intravenous Q12H  . vitamin B-12  100 mcg Oral Daily   Continuous:  ZOX:[id number], hydrALAZINE, ondansetron **OR** ondansetron (ZOFRAN) IV, oxyCODONE-acetaminophen, zolpidem  Assessment/Plan:  Principal Problem:   Leg pain Active Problems:   DMII (diabetes mellitus, type 2) (HCC)   CKD (chronic kidney disease) stage 4, GFR 15-29 ml/min (HCC)   Essential hypertension   Hyperlipidemia   Hyponatremia     Bilateral lower extremity pain. Etiology of the pain is unclear. Pain appears to be mostly localized to the skin. No clinical suspicion for  DVT. She appears to have some element of hyperalgesia. She could not tolerate having ABIs checked, but her TBI were normal. This makes ischemia as an etiology to be very less likely. CT scan of the lumbar spine was done. Report reviewed. Moderate degenerative disc disease noted and disc bulging was noted, but unlikely to be contributing to her current presentation. Do not anticipate any further workup at this time.  Hyponatremia Possibly secondary to diuretics. Sodium level continues to improve. Continue IV fluids for now, but a lower rate. Patient has had hyponatremia previously. However sodium level has not been this low. Urine studies should be interpreted with caution since the urine sample was sent to days after hospitalization and while she was on IV fluids. Urine osmolality is 268. Serum osmolality was 256. Urine sodium 69. No clear diagnosis based on the studies. Cortisol level was borderline low. ACTH stimulation test with normal response. TSH was normal. Continue to hold diuretics.   Hyperkalemia with metabolic acidosis and hyponatremia Elevated potassium level noted this morning. She is not on any potassium supplements. Creatinine also noted to be high today. She'll be given Kayexalate. Labs to be repeated later today and tomorrow. Patient has diabetes. She could have hyporeninemic hypoaldosteronism. We will check plasma renin activity and aldosterone. Check renal ultrasound  Right Knee Pain X-ray did not show any fracture. Arthritis was noted.  Diabetes mellitus type 2 with episodes of hypoglycemia HbA1c was 7.3 in April. No further episodes of hypoglycemia. Continue to hold oral agents. Continue sliding scale coverage. Consider stopping Amaryl at discharge.   Normocytic Anemia Drop in hemoglobin is noted, but stable. No overt bleeding identified. This could be dilutional drop. Continue to monitor.  Chronic kidney disease stage II Creatinine has increased. Monitor urine output. Check  renal ultrasound.  Essential hypertension Monitor blood pressures. Continue clonidine.  Recent UTI She has completed a course of Keflex.  Hyperlipidemia Continue statin.  DVT Prophylaxis: Subcutaneous heparin    Code Status: Full code  Family Communication: Discussed with patient Disposition Plan: Management as outlined above.    LOS: 3 days   [facility]  [facility] Pager [phone] 08/15/2016, 9:48 AM  If 7PM-7AM, please contact night-coverage at [email or web address], password [facility]

## 2016-08-16 LAB — BASIC METABOLIC PANEL
Anion gap: 7 (ref 5–15)
BUN: 17 mg/dL (ref 6–20)
CALCIUM: 8.5 mg/dL — AB (ref 8.9–10.3)
CHLORIDE: 99 mmol/L — AB (ref 101–111)
CO2: 24 mmol/L (ref 22–32)
CREATININE: 1.3 mg/dL — AB (ref 0.44–1.00)
GFR, EST AFRICAN AMERICAN: 40 mL/min — AB (ref >60)
GFR, EST NON AFRICAN AMERICAN: 35 mL/min — AB (ref >60)
Glucose, Bld: 123 mg/dL — ABNORMAL HIGH (ref 65–99)
Potassium: 4.5 mmol/L (ref 3.5–5.1)
SODIUM: 130 mmol/L — AB (ref 135–145)

## 2016-08-16 LAB — GLUCOSE, CAPILLARY
GLUCOSE-CAPILLARY: 100 mg/dL — AB (ref 65–99)
Glucose-Capillary: 229 mg/dL — ABNORMAL HIGH (ref 65–99)

## 2016-08-16 NOTE — Care Management Important Message (Signed)
Important Message  Patient Details  Name: Jeanne RammingRuth B Barker MRN: [id number] Date of Birth: 03/04/1924   Medicare Important Message Given:  Yes    Jeanne Barker 08/16/2016, 1:06 PM

## 2016-08-16 NOTE — Discharge Summary (Addendum)
Physician Discharge Summary  [name]:[id number] DOB: [birth year] DOA: 08/11/2016  PCP: [name], MD  Admit date: 08/11/2016 Discharge date: 08/16/2016  Admitted From: Home Disposition:  Home  Recommendations for Outpatient Follow-up:  1. Follow up with PCP in 1 week 2. Please obtain BMP in 1 week  3. Please follow up on the following pending results: Plasma renin and aldosterone were ordered and pending at time of discharge  Home Health: PT OT   Equipment/Devices: None   Discharge Condition: Stable CODE STATUS: Full  Diet recommendation: Heart healthy   Brief/Interim Summary: From H&P by Dr. Clyde Lundborg: Jeanne Barker is a 83 y.o. female with medical history significant of hypertension, hyperlipidemia, diabetes mellitus, syphilis, hard of hearing (s/p of R cochlear implantation), CKD-III, anemia, who presents with bilateral leg pain.   Patient was recently hospitalized from 04/28-4/29 due to altered mental status secondary to UTI, patient was treated with antibiotics, and discharged on Keflex. Patient needs to take 2 more days of UTI. She states that her symptoms of UTI has resolved. Her mental status is normal today.  Patient states that she started having bilateral leg pain since Sunday. The leg pain involves both lower legs, is constant, 10 out of 10 in safety, nonradiating. Sensations normal. No leg weakness. No back pain, no incontinence of bladder or bowel movement. Patient denies chest pain, cough, shortness rest, fever, chills, GI symptoms, symptoms of UTI.   ED Course: pt was found to haveWBC 5.3, sodium 118, stable renal function, temperature normal, no tachycardia, oxygen saturation 100% on room air. Patient's pulses of DP/PT are difficult to palpable, but are easily detectable by portable Doppler in both legs.   Interim: She was admitted for bilateral leg pain, initially concern for ischemic limb. She underwent workup including TBI which were 0.97 on right and 0.91 on  left, ABIs were unable to be calculated due to patient intolerance, CT lumbar spine with moderate degenerative disc disease, but unlikely to be contributing to her current presentation. She was also found to have hyponatremia, sodium of 118. Sodium continued to improve with IV hydration. Diuretics were held. She was evaluated by physical therapy and occupational therapy who recommended home health PT and OT. Etiology of her bilateral lower extremity pain is unclear, but pain does appear to be localized to the skin with element of hyperalgesia. Workup of hyponatremia was unrevealing, possibly secondary to diuretics. She also had hyperkalemia which resolved with Kayexalate. Plasma renin and aldosterone were ordered and pending at time of discharge  Discharge Diagnoses:  Principal Problem:   Leg pain Active Problems:   DMII (diabetes mellitus, type 2) (HCC)   CKD (chronic kidney disease) stage 4, GFR 15-29 ml/min (HCC)   Essential hypertension   Hyperlipidemia   Hyponatremia   Bilateral lower extremity pain Etiology of the pain is unclear. Pain appears to be mostly localized to the skin. No clinical suspicion for DVT. She appears to have some element of hyperalgesia. She could not tolerate having ABIs checked, but her TBI were normal. This makes ischemia as an etiology to be very less likely. CT scan of the lumbar spine was done which showed moderate degenerative disc disease noted and disc bulging was noted, but unlikely to be contributing to her current presentation. She was advised to take tylenol as needed for pain.   Hyponatremia Possibly secondary to diuretics. Sodium level continues to improve with IVF. Patient has had hyponatremia previously. Urine studies should be interpreted with caution since the  urine sample was sent to days after hospitalization and while she was on IV fluids. Urine osmolality is 268. Serum osmolality was 256. Urine sodium 69. No clear diagnosis based on the studies.  Cortisol level was borderline low. ACTH stimulation test with normal response. TSH was normal. Continue to hold diuretics. Lasix discontinued at time of discharge, to follow up with PCP and repeat BMP as outpatient.  Hyperkalemia with metabolic acidosis and hyponatremia Resolved. Patient has diabetes. She could have hyporeninemic hypoaldosteronism. Plasma renin activity and aldosterone ordered and pending at time of discharge. Follow up with PCP regarding results.  Right Knee Pain X-ray did not show any fracture. Arthritis was noted.  Diabetes mellitus type 2 with episodes of hypoglycemia HbA1c was 7.3 in April. Stop Amaryl at discharge in this 83 year old patient to avoid hypoglycemic events  Normocytic Anemia Stable   Chronic kidney disease stage II Stable. Renal US Normal-appearing right kidney, Status post left nephrectomy.  Essential hypertension Monitor blood pressures. Continue clonidine.  Recent UTI She has completed a course of Keflex.  Hyperlipidemia Continue statin.   Discharge Instructions  Discharge Instructions    Call MD for:  extreme fatigue    Complete by:  As directed    Call MD for:  persistant dizziness or light-headedness    Complete by:  As directed    Call MD for:  persistant nausea and vomiting    Complete by:  As directed    Call MD for:  severe uncontrolled pain    Complete by:  As directed    Call MD for:  temperature >100.4    Complete by:  As directed    Diet - low sodium heart healthy    Complete by:  As directed    Discharge instructions    Complete by:  As directed    You were cared for by a hospitalist during your hospital stay. If you have any questions about your discharge medications or the care you received while you were in the hospital after you are discharged, you can call the unit and asked to speak with the hospitalist on call if the hospitalist that took care of you is not available. Once you are discharged, your primary  care physician will handle any further medical issues. Please note that NO REFILLS for any discharge medications will be authorized once you are discharged, as it is imperative that you return to your primary care physician (or establish a relationship with a primary care physician if you do not have one) for your aftercare needs so that they can reassess your need for medications and monitor your lab values.   Increase activity slowly    Complete by:  As directed      Allergies as of 08/16/2016      Reactions   Amlodipine Besylate Nausea Only   Lisinopril Other (See Comments)   REACTION: hyperkalemia      Medication List    STOP taking these medications   cephALEXin 500 MG capsule Commonly known as:  KEFLEX   furosemide 20 MG tablet Commonly known as:  LASIX   glimepiride 2 MG tablet Commonly known as:  AMARYL     TAKE these medications   acetaminophen 500 MG tablet Commonly known as:  TYLENOL Take 500 mg by mouth every 6 (six) hours as needed for mild pain.   aspirin 81 MG tablet Take 81 mg by mouth daily.   cloNIDine 0.1 MG tablet Commonly known as:  CATAPRES Take 1  tablet (0.1 mg total) by mouth 2 (two) times daily.   glucose blood test strip Commonly known as:  RELION GLUCOSE TEST STRIPS Check no more than twice daily.   RELION ULTRA THIN PLUS LANCETS Misc Check no more than twice daily   simvastatin 40 MG tablet Commonly known as:  ZOCOR Take 1 tablet (40 mg total) by mouth at bedtime. What changed:  when to take this   sitaGLIPtin 50 MG tablet Commonly known as:  JANUVIA Take 1 tablet (50 mg total) by mouth daily.   trolamine salicylate 10 % cream Commonly known as:  ASPERCREME Apply topically as needed.   vitamin B-12 100 MCG tablet Commonly known as:  CYANOCOBALAMIN Take 100 mcg by mouth daily.      Follow-up Information    Wanda PlumpPaz, Jose E, MD. Schedule an appointment as soon as possible for a visit on 08/24/2016.   Specialty:  Internal  Medicine Why:  Appointment is on May 16th at 11:30 Contact information: [address] [place] [address] [phone]        Jobe Gibbonriangle, [facility] Follow up.   Specialty:  Home Health Services Why:  home health services arranged, office to call to set up home visits Contact information: [address] [place] [address] [phone]          Allergies  Allergen Reactions  . Amlodipine Besylate Nausea Only  . Lisinopril Other (See Comments)    REACTION: hyperkalemia    Consultations:  None   Procedures/Studies: Dg Chest 2 View  Result Date: 08/06/2016 CLINICAL DATA:  Altered mental status. EXAM: CHEST  2 VIEW COMPARISON:  06/18/2014 FINDINGS: There is mild bilateral interstitial thickening likely chronic. There is no focal parenchymal opacity. There is no pleural effusion or pneumothorax. The heart and mediastinal contours are unremarkable. The osseous structures are unremarkable. IMPRESSION: No active cardiopulmonary disease. Electronically Signed   By: Elige KoHetal  Patel   On: 08/06/2016 11:42   Dg Knee 1-2 Views Right  Result Date: 08/12/2016 CLINICAL DATA:  Right knee pain and swelling.  No known injury. EXAM: RIGHT KNEE - 1-2 VIEW COMPARISON:  None. FINDINGS: No acute bony or joint abnormality seen. No joint effusion. Chondrocalcinosis is seen. Tricompartmental osteophytosis is present with only mild to moderate joint space narrowing most notable in the lateral compartment. Atherosclerosis is identified. IMPRESSION: No acute abnormality. Mild to moderate osteoarthritis about the knee. Atherosclerosis Electronically Signed   By: Drusilla Kannerhomas  Dalessio M.D.   On: 08/12/2016 13:39   Ct Head Wo Contrast  Result Date: 08/06/2016 CLINICAL DATA:  Altered mental status. Last seen normal at 1:30 p.m. yesterday. History of diabetes, hyperlipidemia, syphilis, hypertension, and chronic renal insufficiency. EXAM: CT HEAD WITHOUT CONTRAST TECHNIQUE:  Contiguous axial images were obtained from the base of the skull through the vertex without intravenous contrast. COMPARISON:  CT head 08/29/2003. FINDINGS: Brain: No evidence for acute infarction, hemorrhage, mass lesion, hydrocephalus, or extra-axial fluid. Advanced atrophy, not unexpected for age. Hypoattenuation of white matter consistent with small vessel disease. Significant obscuration of portions of the RIGHT hemisphere due to a RIGHT cochlear implant with beam attenuation. Vascular: No hyperdense vessel or unexpected calcification. Skull: Normal. Negative for fracture or focal lesion. Sinuses/Orbits: No acute finding. Other: Status post mastoidectomy on the RIGHT. Status post RIGHT cochlear implant. Compared with 2005, some progression of atrophy. IMPRESSION: Atrophy and small vessel disease. No acute intracranial findings. Limitation of interpretation due to beam hardening from cochlear  implant. Electronically Signed   By: Elsie Stain M.D.   On: 08/06/2016 10:58   Ct Lumbar Spine Wo Contrast  Result Date: 08/12/2016 CLINICAL DATA:  Right leg pain for 4 days EXAM: CT LUMBAR SPINE WITHOUT CONTRAST TECHNIQUE: Multidetector CT imaging of the lumbar spine was performed without intravenous contrast administration. Multiplanar CT image reconstructions were also generated. COMPARISON:  01/22/2016 FINDINGS: Segmentation: 5 lumbar type vertebra. Alignment: Mild scoliosis. Vertebrae: Vertebral body heights are normal. No fracture is visualized. Paraspinal and other soft tissues: Nonvisualization of the left kidney. Prominent right extrarenal pelvis. No paravertebral or paraspinal soft tissue abnormality. Fluid density in the anterior pelvis presumably represents a dilated bladder. Disc levels: Moderate severe multilevel degenerative disc changes with moderate severe narrowing at L1-L2 and L2-L3, associated vacuum disc and endplate sclerosis. Moderate degenerative changes at L3-L4, L4-L5 and L5-S1 with anterior  osteophyte and vacuum discs. Mild canal stenosis suspected at L1-L2. Suspect moderate canal stenosis at L2-L3 due to disc disease and hypertrophic facet arthropathy. Broad-based disc at L3-L4 with suspected mild canal stenosis. Mild broad-based disc at L4-L5. Moderate broad based disc bulge at L5-S1. Mild right greater than left foraminal stenosis at L5-S1. IMPRESSION: 1. No acute osseous abnormality. 2. Moderate severe multilevel degenerative disc changes, most marked at L1-L2 and L2-L3. Moderate central and right paracentral disc bulging at L5-S1 with right greater than left foraminal narrowing. 3. Nonvisualization of the left kidney. Electronically Signed   By: Jasmine Pang M.D.   On: 08/12/2016 00:47   US Renal  Result Date: 08/15/2016 CLINICAL DATA:  Acute renal failure. History of prior left nephrectomy. EXAM: RENAL / URINARY TRACT ULTRASOUND COMPLETE COMPARISON:  Renal ultrasound 07/01/2005. FINDINGS: Right Kidney: Length: 11.7 cm. Echogenicity within normal limits. No mass or hydronephrosis visualized. Left Kidney: Removed. Bladder: Appears normal for degree of bladder distention. IMPRESSION: Normal-appearing right kidney. Status post left nephrectomy. Electronically Signed   By: Drusilla Kanner M.D.   On: 08/15/2016 15:50   Vascular ABI and TBI Summary: Patient in significant pain and unable to tolerate the pressure from the transducer to obtain Doppler flow, and blood pressures. Therefore, unable to calculate ABIs bilaterally. TBIs are within normal limits bilaterally.   Discharge Exam: Vitals:   08/16/16 0625 08/16/16 1010  BP: (!) 147/62 (!) 136/51  Pulse: 77   Resp: 16   Temp: 98.4 F (36.9 C)    Vitals:   08/15/16 1423 08/15/16 2055 08/16/16 0625 08/16/16 1010  BP: (!) 182/56 (!) 149/44 (!) 147/62 (!) 136/51  Pulse: 97 92 77   Resp: 16 18 16    Temp: 98.3 F (36.8 C) 99.2 F (37.3 C) 98.4 F (36.9 C)   TempSrc: Oral Oral Oral   SpO2: 98% 92% 96%   Weight:      Height:         General: Pt is alert, awake, not in acute distress, very hard of hearing  Cardiovascular: RRR, S1/S2 +, no rubs, no gallops Respiratory: CTA bilaterally, no wheezing, no rhonchi Abdominal: Soft, NT, ND, bowel sounds + Extremities: no edema, no cyanosis, very painful to palpation     The results of significant diagnostics from this hospitalization (including imaging, microbiology, ancillary and laboratory) are listed below for reference.     Microbiology: No results found for this or any previous visit (from the past 240 hour(s)).   Labs: BNP (last 3 results) No results for input(s): BNP in the last 8760 hours. Basic Metabolic Panel:  Recent Labs Lab 08/13/16 1847 08/14/16  0321 08/15/16 0625 08/15/16 1141 08/16/16 0559  NA 123* 124* 127* 129* 130*  K 4.7 5.1 5.7* 5.5* 4.5  CL 94* 95* 101 99* 99*  CO2 20* 20* 20* 22 24  GLUCOSE 120* 221* 118* 247* 123*  BUN 15 20 18 16 17   CREATININE 1.26* 1.37* 1.46* 1.51* 1.30*  CALCIUM 8.9 8.4* 8.6* 9.1 8.5*   Liver Function Tests: No results for input(s): AST, ALT, ALKPHOS, BILITOT, PROT, ALBUMIN in the last 168 hours. No results for input(s): LIPASE, AMYLASE in the last 168 hours. No results for input(s): AMMONIA in the last 168 hours. CBC:  Recent Labs Lab 08/11/16 2019 08/12/16 0448 08/14/16 0321 08/15/16 0625  WBC 5.3 5.6 5.1 4.9  NEUTROABS 3.2  --   --   --   HGB 10.6* 11.9* 8.7* 8.6*  HCT 31.5* 34.2* 26.7* 26.2*  MCV 80.2 80.7 81.9 83.2  PLT 268 260 237 244   Cardiac Enzymes:  Recent Labs Lab 08/11/16 2019  CKTOTAL 154   BNP: Invalid input(s): POCBNP CBG:  Recent Labs Lab 08/15/16 1217 08/15/16 1729 08/15/16 2217 08/16/16 0817 08/16/16 1132  GLUCAP 228* 120* 138* 100* 229*   D-Dimer No results for input(s): DDIMER in the last 72 hours. Hgb A1c No results for input(s): HGBA1C in the last 72 hours. Lipid Profile No results for input(s): CHOL, HDL, LDLCALC, TRIG, CHOLHDL, LDLDIRECT in the  last 72 hours. Thyroid function studies No results for input(s): TSH, T4TOTAL, T3FREE, THYROIDAB in the last 72 hours.  Invalid input(s): FREET3 Anemia work up  Recent Labs  08/15/16 0625  VITAMINB12 2,246*  FOLATE 10.3  FERRITIN 45  TIBC 253  IRON 64  RETICCTPCT 1.4   Urinalysis    Component Value Date/Time   COLORURINE YELLOW 08/06/2016 1200   APPEARANCEUR HAZY (A) 08/06/2016 1200   LABSPEC 1.005 08/06/2016 1200   PHURINE 6.0 08/06/2016 1200   GLUCOSEU 50 (A) 08/06/2016 1200   HGBUR NEGATIVE 08/06/2016 1200   BILIRUBINUR NEGATIVE 08/06/2016 1200   KETONESUR NEGATIVE 08/06/2016 1200   PROTEINUR NEGATIVE 08/06/2016 1200   UROBILINOGEN 0.2 06/18/2014 2200   NITRITE NEGATIVE 08/06/2016 1200   LEUKOCYTESUR SMALL (A) 08/06/2016 1200   Sepsis Labs Invalid input(s): PROCALCITONIN,  WBC,  LACTICIDVEN Microbiology No results found for this or any previous visit (from the past 240 hour(s)).   Time coordinating discharge: 40 minutes  SIGNED:  Noralee Stain, DO [facility] Pager [phone]  If 7PM-7AM, please contact night-coverage [email or web address] Password [facility] 08/16/2016, 3:38 PM

## 2016-08-16 NOTE — Progress Notes (Signed)
Physical Therapy Treatment Patient Details Name: Jeanne Barker MRN: [id number] DOB: Jun 20, 1923 Today's Date: 08/16/2016    History of Present Illness Pt adm with bil leg pain of unknown etiology. PMH - DM, HOH with cochlear implant, OA, HTN, CKD    PT Comments    Pt continuing to progress towards goals and increased ambulation tolerance. Pt continuing to require min guard to min A during functional mobility tasks. Anticipate pt will continue progressing and will be appropriate for return home with HHPT services. Will continue to follow and progress mobility according to pt tolerance.    Follow Up Recommendations  Home health PT;Supervision/Assistance - 24 hour     Equipment Recommendations  None recommended by PT    Recommendations for Other Services       Precautions / Restrictions Precautions Precautions: Fall Restrictions Weight Bearing Restrictions: No    Mobility  Bed Mobility Overal bed mobility: Needs Assistance Bed Mobility: Supine to Sit     Supine to sit: Min guard;HOB elevated     General bed mobility comments: Min guard for safety. Cues to scoot to EOB for B foot placement onto floor.   Transfers Overall transfer level: Needs assistance Equipment used: Rolling walker (2 wheeled) Transfers: Sit to/from Stand Sit to Stand: Min assist         General transfer comment: Min A for steadying and lift assist. Verbal cues for hand placement as pt began to pull up from RW.   Ambulation/Gait Ambulation/Gait assistance: Min guard Ambulation Distance (Feet): 200 Feet Assistive device: Rolling walker (2 wheeled) Gait Pattern/deviations: Step-through pattern;Decreased step length - right;Decreased step length - left;Trunk flexed Gait velocity: Decreased Gait velocity interpretation: Below normal speed for age/gender General Gait Details: Slow, guarded gait secondary to BLE pain. Continuous verbal cues for upright posture and appropriate proximity to device. Min  guard for steadying.    Stairs            Wheelchair Mobility    Modified Rankin (Stroke Patients Only)       Balance Overall balance assessment: Needs assistance Sitting-balance support: No upper extremity supported;Feet supported Sitting balance-Leahy Scale: Good     Standing balance support: Bilateral upper extremity supported;Single extremity supported;During functional activity Standing balance-Leahy Scale: Poor Standing balance comment: Reliant on RW for support.                             Cognition Arousal/Alertness: Awake/alert Behavior During Therapy: WFL for tasks assessed/performed Overall Cognitive Status: Within Functional Limits for tasks assessed                                 General Comments: Pt HOH       Exercises General Exercises - Lower Extremity Ankle Circles/Pumps: AROM;Both;10 reps;Supine Long Arc Quad: AROM;Both;10 reps;Seated    General Comments        Pertinent Vitals/Pain Pain Assessment: Faces Faces Pain Scale: Hurts little more Pain Location: BLE's from thigh into calf with movement and to touch Pain Descriptors / Indicators: Grimacing Pain Intervention(s): Limited activity within patient's tolerance;Monitored during session;Repositioned    Home Living                      Prior Function            PT Goals (current goals can now be found in the care plan section) Acute  Rehab PT Goals Patient Stated Goal: to go home PT Goal Formulation: With patient Time For Goal Achievement: 08/20/16 Potential to Achieve Goals: Good Progress towards PT goals: Progressing toward goals    Frequency    Min 3X/week      PT Plan Current plan remains appropriate    Co-evaluation              AM-PAC PT "6 Clicks" Daily Activity  Outcome Measure  Difficulty turning over in bed (including adjusting bedclothes, sheets and blankets)?: A Lot Difficulty moving from lying on back to sitting on  the side of the bed? : A Lot Difficulty sitting down on and standing up from a chair with arms (e.g., wheelchair, bedside commode, etc,.)?: Total Help needed moving to and from a bed to chair (including a wheelchair)?: A Little Help needed walking in hospital room?: A Little Help needed climbing 3-5 steps with a railing? : A Little 6 Click Score: 14    End of Session Equipment Utilized During Treatment: Gait belt Activity Tolerance: Patient tolerated treatment well Patient left: in chair;with call bell/phone within reach;with chair alarm set;with family/visitor present Nurse Communication: Mobility status PT Visit Diagnosis: Unsteadiness on feet (R26.81);Difficulty in walking, not elsewhere classified (R26.2)     Time: 1040-1056 PT Time Calculation (min) (ACUTE ONLY): 16 min  Charges:  $Gait Training: 8-22 mins                    G Codes:       Margot ChimesBrittany Smith, PT, DPT  Acute Rehabilitation Services  Pager: [phone]    Melvyn NovasBrittany L Smith 08/16/2016, 11:57 AM

## 2016-08-16 NOTE — Care Management Note (Signed)
Case Management Note  Patient Details  Name: Yisroel RammingRuth B Gunnells MRN: [id number] Date of Birth: 05/22/1923  Subjective/Objective:                   Admitted with bil leg pain of unknown etiology. PMH - DM, HOH with cochlear implant, OA, HTN, CKD Recently admitted 4/28-4/29/2018  for Acute encephalopathy secondary to uti .              Clayborne DanaMollie Woods (Daughter)     [phone]      PCP: [name]  Action/Plan: Plan is to d/c to home with home health services.  Expected Discharge Date:  08/16/16               Expected Discharge Plan:  Home w Home Health Services  In-House Referral:     Discharge planning Services  CM Consult  Post Acute Care Choice:    Choice offered to:  Patient, Adult Children  DME Arranged:    DME Agency:     HH Arranged:  PT, OT, Nurse's Aide, RN HH Agency:  [facility], referral made with [facility].  Status of Service:  Completed, signed off  If discussed at Long Length of Stay Meetings, dates discussed:    Additional Comments:  [name], Jeremie Abdelaziz [name], RN 08/16/2016, 12:30 PM

## 2016-08-16 NOTE — Progress Notes (Signed)
Jeanne Barker to be D/C'd to home with home health per MD order.  Discussed with the patient and all questions fully answered.  VSS, Skin clean, dry and intact without evidence of skin break down, no evidence of skin tears noted. IV catheter discontinued intact. Site without signs and symptoms of complications. Dressing and pressure applied.  An After Visit Summary was printed and given to the patient.  D/c education completed with patient/family including follow up instructions, medication list, d/c activities limitations if indicated, with other d/c instructions as indicated by MD - patient able to verbalize understanding, all questions fully answered.   Patient instructed to return to ED, call 911, or call MD for any changes in condition.   Patient escorted via WC, and D/C home via private auto.  Joellyn HaffKayla L Price 08/16/2016 2:05 PM

## 2016-08-16 NOTE — Progress Notes (Signed)
Occupational Therapy Treatment Patient Details Name: Jeanne RammingRuth B Barker MRN: [id number] DOB: 03/13/1924 Today's Date: 08/16/2016    History of present illness Pt adm with bil leg pain of unknown etiology. PMH - DM, HOH with cochlear implant, OA, HTN, CKD   OT comments  Pt is eager to go home later today. Her daughter will provide 24 hour assistance. Pt continues to require min to min guard assist for ADL. Continue to recommend HHOT to facilitate pt's return to independence.  Follow Up Recommendations  Home health OT;Supervision/Assistance - 24 hour    Equipment Recommendations  None recommended by OT    Recommendations for Other Services      Precautions / Restrictions Precautions Precautions: Fall Restrictions Weight Bearing Restrictions: No       Mobility Bed Mobility General bed mobility comments: pt in chair  Transfers Overall transfer level: Needs assistance Equipment used: Rolling walker (2 wheeled) Transfers: Sit to/from Stand Sit to Stand: Min assist         General transfer comment: cues for hand placement, min assist to rise and gain balance    Balance Overall balance assessment: Needs assistance Sitting-balance support: No upper extremity supported;Feet supported Sitting balance-Leahy Scale: Good     Standing balance support: Bilateral upper extremity supported;Single extremity supported;During functional activity Standing balance-Leahy Scale: Poor Standing balance comment: Reliant on RW for support, can release walker in static standing                           ADL either performed or assessed with clinical judgement   ADL Overall ADL's : Needs assistance/impaired     Grooming: Min guard;Standing;Wash/dry hands               Lower Body Dressing: Minimal assistance;Sit to/from stand   Toilet Transfer: Min guard;Ambulation;RW   Toileting- Clothing Manipulation and Hygiene: Minimal assistance;Sit to/from stand       Functional  mobility during ADLs: Health and safety inspectorMin guard;Rolling walker       Vision       Perception     Praxis      Cognition Arousal/Alertness: Awake/alert Behavior During Therapy: WFL for tasks assessed/performed Overall Cognitive Status: Within Functional Limits for tasks assessed                                 General Comments: Pt HOH, difficult to accurately assess cognition.        Exercises Exercises: General Lower Extremity General Exercises - Lower Extremity Ankle Circles/Pumps: AROM;Both;10 reps;Supine Long Arc Quad: AROM;Both;10 reps;Seated   Shoulder Instructions       General Comments      Pertinent Vitals/ Pain       Pain Assessment: Faces Faces Pain Scale: Hurts little more Pain Location: B LEs Pain Descriptors / Indicators: Grimacing Pain Intervention(s): Monitored during session  Home Living                                          Prior Functioning/Environment              Frequency  Min 2X/week        Progress Toward Goals  OT Goals(current goals can now be found in the care plan section)  Progress towards OT goals: Progressing toward goals  Acute Rehab OT Goals  Patient Stated Goal: to go home OT Goal Formulation: With patient/family Time For Goal Achievement: 08/28/16 Potential to Achieve Goals: Good  Plan Discharge plan remains appropriate    Co-evaluation                 AM-PAC PT "6 Clicks" Daily Activity     Outcome Measure   Help from another person eating meals?: None Help from another person taking care of personal grooming?: A Little Help from another person toileting, which includes using toliet, bedpan, or urinal?: A Little Help from another person bathing (including washing, rinsing, drying)?: A Little Help from another person to put on and taking off regular upper body clothing?: None Help from another person to put on and taking off regular lower body clothing?: A Little 6 Click Score:  20    End of Session Equipment Utilized During Treatment: Gait belt;Rolling walker  OT Visit Diagnosis: Unsteadiness on feet (R26.81);Muscle weakness (generalized) (M62.81);Pain Pain - part of body: Leg   Activity Tolerance Patient tolerated treatment well   Patient Left in chair;with call bell/phone within reach;with chair alarm set   Nurse Communication          Time: 1610-9604 OT Time Calculation (min): 13 min  Charges: OT General Charges $OT Visit: 1 Procedure OT Treatments $Self Care/Home Management : 8-22 mins   Evern Bio 08/16/2016, 12:36 PM  [phone]

## 2016-08-17 ENCOUNTER — Telehealth: Payer: Self-pay

## 2016-08-17 ENCOUNTER — Telehealth: Payer: Self-pay | Admitting: Behavioral Health

## 2016-08-17 DIAGNOSIS — Z87440 Personal history of urinary (tract) infections: Secondary | ICD-10-CM | POA: Diagnosis not present

## 2016-08-17 DIAGNOSIS — M1711 Unilateral primary osteoarthritis, right knee: Secondary | ICD-10-CM | POA: Diagnosis not present

## 2016-08-17 DIAGNOSIS — N184 Chronic kidney disease, stage 4 (severe): Secondary | ICD-10-CM | POA: Diagnosis not present

## 2016-08-17 DIAGNOSIS — E1122 Type 2 diabetes mellitus with diabetic chronic kidney disease: Secondary | ICD-10-CM | POA: Diagnosis not present

## 2016-08-17 DIAGNOSIS — I129 Hypertensive chronic kidney disease with stage 1 through stage 4 chronic kidney disease, or unspecified chronic kidney disease: Secondary | ICD-10-CM | POA: Diagnosis not present

## 2016-08-17 DIAGNOSIS — D649 Anemia, unspecified: Secondary | ICD-10-CM | POA: Diagnosis not present

## 2016-08-17 DIAGNOSIS — Z7982 Long term (current) use of aspirin: Secondary | ICD-10-CM | POA: Diagnosis not present

## 2016-08-17 DIAGNOSIS — M79661 Pain in right lower leg: Secondary | ICD-10-CM | POA: Diagnosis not present

## 2016-08-17 DIAGNOSIS — Z9181 History of falling: Secondary | ICD-10-CM | POA: Diagnosis not present

## 2016-08-17 DIAGNOSIS — M79662 Pain in left lower leg: Secondary | ICD-10-CM | POA: Diagnosis not present

## 2016-08-17 DIAGNOSIS — Z87891 Personal history of nicotine dependence: Secondary | ICD-10-CM | POA: Diagnosis not present

## 2016-08-17 DIAGNOSIS — E785 Hyperlipidemia, unspecified: Secondary | ICD-10-CM | POA: Diagnosis not present

## 2016-08-17 NOTE — Telephone Encounter (Signed)
Spoke w/ Felecia Janina Thompson, [facility]- requesting verbal orders for nursing once weekly for 4 weeks, verbal orders given. Informed PT/OT will call for VOs as well.

## 2016-08-17 NOTE — Telephone Encounter (Signed)
Attempted to reach patient/caregiver for TCM/Hospital Follow-up call. Left message for patient/caregiver to return call when available.

## 2016-08-18 NOTE — Telephone Encounter (Signed)
Left message x 2 for TCM/Hospital Follow-up call. Per patient's chart, she has an appointment scheduled on 08/24/16 at 11:30 AM with Dr. Drue NovelPaz for a hospital follow-up visit.

## 2016-08-19 ENCOUNTER — Telehealth: Payer: Self-pay | Admitting: Internal Medicine

## 2016-08-19 DIAGNOSIS — M79662 Pain in left lower leg: Secondary | ICD-10-CM | POA: Diagnosis not present

## 2016-08-19 DIAGNOSIS — M79661 Pain in right lower leg: Secondary | ICD-10-CM | POA: Diagnosis not present

## 2016-08-19 DIAGNOSIS — N184 Chronic kidney disease, stage 4 (severe): Secondary | ICD-10-CM | POA: Diagnosis not present

## 2016-08-19 DIAGNOSIS — M1711 Unilateral primary osteoarthritis, right knee: Secondary | ICD-10-CM | POA: Diagnosis not present

## 2016-08-19 DIAGNOSIS — I129 Hypertensive chronic kidney disease with stage 1 through stage 4 chronic kidney disease, or unspecified chronic kidney disease: Secondary | ICD-10-CM | POA: Diagnosis not present

## 2016-08-19 DIAGNOSIS — E1122 Type 2 diabetes mellitus with diabetic chronic kidney disease: Secondary | ICD-10-CM | POA: Diagnosis not present

## 2016-08-19 LAB — ALDOSTERONE + RENIN ACTIVITY W/ RATIO
ALDO / PRA Ratio: <1.5 (ref 0.0–30.0)
PRA LC/MS/MS: 0.667 ng/mL/hr (ref 0.167–5.380)

## 2016-08-19 NOTE — Telephone Encounter (Signed)
LMOM w/ verbal orders.  

## 2016-08-19 NOTE — Telephone Encounter (Signed)
Caller name: Manus GunningKindra Relationship to patient[facility] Can be reached: [phone] Pharmacy:  Reason for call: Request verbal orders for PT 2 times a week for 6 weeks.

## 2016-08-23 DIAGNOSIS — M79661 Pain in right lower leg: Secondary | ICD-10-CM | POA: Diagnosis not present

## 2016-08-23 DIAGNOSIS — M1711 Unilateral primary osteoarthritis, right knee: Secondary | ICD-10-CM | POA: Diagnosis not present

## 2016-08-23 DIAGNOSIS — N184 Chronic kidney disease, stage 4 (severe): Secondary | ICD-10-CM | POA: Diagnosis not present

## 2016-08-23 DIAGNOSIS — M79662 Pain in left lower leg: Secondary | ICD-10-CM | POA: Diagnosis not present

## 2016-08-23 DIAGNOSIS — E1122 Type 2 diabetes mellitus with diabetic chronic kidney disease: Secondary | ICD-10-CM | POA: Diagnosis not present

## 2016-08-23 DIAGNOSIS — I129 Hypertensive chronic kidney disease with stage 1 through stage 4 chronic kidney disease, or unspecified chronic kidney disease: Secondary | ICD-10-CM | POA: Diagnosis not present

## 2016-08-24 ENCOUNTER — Encounter: Payer: Self-pay | Admitting: Internal Medicine

## 2016-08-24 ENCOUNTER — Ambulatory Visit (INDEPENDENT_AMBULATORY_CARE_PROVIDER_SITE_OTHER): Payer: Medicare Other | Admitting: Internal Medicine

## 2016-08-24 VITALS — BP 158/45 | HR 64 | Temp 98.2°F | Resp 16 | Ht 60.0 in | Wt 141.2 lb

## 2016-08-24 DIAGNOSIS — E538 Deficiency of other specified B group vitamins: Secondary | ICD-10-CM | POA: Diagnosis not present

## 2016-08-24 DIAGNOSIS — Z09 Encounter for follow-up examination after completed treatment for conditions other than malignant neoplasm: Secondary | ICD-10-CM

## 2016-08-24 DIAGNOSIS — E871 Hypo-osmolality and hyponatremia: Secondary | ICD-10-CM

## 2016-08-24 DIAGNOSIS — M791 Myalgia, unspecified site: Secondary | ICD-10-CM

## 2016-08-24 DIAGNOSIS — I10 Essential (primary) hypertension: Secondary | ICD-10-CM

## 2016-08-24 DIAGNOSIS — E1121 Type 2 diabetes mellitus with diabetic nephropathy: Secondary | ICD-10-CM | POA: Diagnosis not present

## 2016-08-24 DIAGNOSIS — N184 Chronic kidney disease, stage 4 (severe): Secondary | ICD-10-CM | POA: Diagnosis not present

## 2016-08-24 LAB — BASIC METABOLIC PANEL
BUN: 23 mg/dL (ref 6–23)
CALCIUM: 9.5 mg/dL (ref 8.4–10.5)
CHLORIDE: 102 meq/L (ref 96–112)
CO2: 22 meq/L (ref 19–32)
Creatinine, Ser: 1.43 mg/dL — ABNORMAL HIGH (ref 0.40–1.20)
GFR: 44.23 mL/min — ABNORMAL LOW (ref >60.00)
GLUCOSE: 91 mg/dL (ref 70–99)
Potassium: 4.7 mEq/L (ref 3.5–5.1)
SODIUM: 132 meq/L — AB (ref 135–145)

## 2016-08-24 LAB — SEDIMENTATION RATE: SED RATE: 16 mm/h (ref 0–30)

## 2016-08-24 LAB — VITAMIN D 25 HYDROXY (VIT D DEFICIENCY, FRACTURES): VITD: 15.97 ng/mL — ABNORMAL LOW (ref 30.00–100.00)

## 2016-08-24 LAB — CK: Total CK: 62 U/L (ref 7–177)

## 2016-08-24 LAB — FOLATE: Folate: 16.2 ng/mL (ref >5.9)

## 2016-08-24 LAB — C-REACTIVE PROTEIN: CRP: 0.4 mg/dL — AB (ref 0.5–20.0)

## 2016-08-24 MED ORDER — HYDROCODONE-ACETAMINOPHEN 5-325 MG PO TABS: 1.0000 | ORAL_TABLET | Freq: Every evening | ORAL | 0 refills | Status: DC | PRN

## 2016-08-24 NOTE — Patient Instructions (Addendum)
GO TO THE LAB : Get the blood work     GO TO THE FRONT DESK Schedule your next appointment for a  checkup in one month  Stop taking simvastatin  Call anytime if you have fever or chills or a rash or headache.    Check the  blood pressure daily Be sure your blood pressure is between 110/65 and  145/85. If it is consistently higher or lower, let me know

## 2016-08-24 NOTE — Progress Notes (Signed)
Subjective:    Patient ID: Jeanne Barker, female    DOB: 06/20/1923, 83 y.o.   MRN: [id number]  DOS:  08/24/2016 Type of visit - description : TCM 14 Interval history: Admitted to hospital twice since the last time I saw her: Discharge 08/07/2016, Acute encephalopathy, UTI, hyponatremia, CT head nonacute, urine culture positive for Escherichia coli. Chest x-ray negative. Intermittent bigeminy noted.  Admitted again 08/11/2016, discharge 08/16/2016, r --Had bilateral leg pain, 10/ 10. No neurological symptoms such as back pain, incontinence of bladder or bowel, etc.  CT lumbar with DJD but unlikely to be contributing to pain. ABIs: Unable to calculate by TBI were 0.97 at 0.91.Unlikely to have severe vascular disease Etiology of pain was unclear, has  hyperalgesia --She continue with hyponatremia. Was hydrated  and diuretics were held; etiology unclear possibly from diuretics. She also had transient hyperkalemia. Plasma renin aldosterone came back eventually negative.     Review of Systems  After his first admission she did well with no more mental status changes, fever, chills, dysuria, gross hematuria. No nausea, vomiting, diarrhea.  The second admission was mostly due to lower extremity pain and hyponatremia. The pain is actually going for a while but worse over the last few weeks. At this point, she remains afebrile but continue having pain. The pain goes from the hips down to both legs to the feet. It is constant and worse with walking. She also have some ill-defined numbness and tingling in both legs, even touching her legs hurt. There has been no fall or injury. UE (shoulder-arms)  not affected. She reports no back pain but she was TTP there. See physical exam. No headaches.  Past Medical History:  Diagnosis Date  . Anemia   . Chronic renal insufficiency   . Diabetes mellitus   . Hard of hearing   . Hyperlipidemia   . Hypertension   . Osteoarthritis   . Syphilis    ?latent?, treated c/ PCN 1993    Past Surgical History:  Procedure Laterality Date  . ABDOMINAL HYSTERECTOMY     BSO  . CATARACT EXTRACTION    . cochlear impalnt  1/02  . NEPHRECTOMY     left  . OOPHORECTOMY      Social History   Social History  . Marital status: Single    Spouse name: N/A  . Number of children: 5  . Years of education: N/A   Occupational History  . PT job   .  Retired   Social History Main Topics  . Smoking status: Former Games developermoker  . Smokeless tobacco: Never Used  . Alcohol use No  . Drug use: No  . Sexual activity: No   Other Topics Concern  . Not on file   Social History Narrative   Son lives with her, still drives to work.   Her daughter [name] [phone])  lives 20 minutes away and usually comes w/ the pt to the office visit      Allergies as of 08/24/2016      Reactions   Amlodipine Besylate Nausea Only   Lisinopril Other (See Comments)   REACTION: hyperkalemia      Medication List       Accurate as of 08/24/16 11:59 PM. Always use your most recent med list.          acetaminophen 500 MG tablet Commonly known as:  TYLENOL Take 500 mg by mouth every 6 (six) hours as needed for mild pain.   aspirin  81 MG tablet Take 81 mg by mouth daily.   cloNIDine 0.1 MG tablet Commonly known as:  CATAPRES Take 1 tablet (0.1 mg total) by mouth 2 (two) times daily.   glucose blood test strip Commonly known as:  RELION GLUCOSE TEST STRIPS Check no more than twice daily.   HYDROcodone-acetaminophen 5-325 MG tablet Commonly known as:  NORCO/VICODIN Take 1-2 tablets by mouth at bedtime as needed.   RELION ULTRA THIN PLUS LANCETS Misc Check no more than twice daily   simvastatin 40 MG tablet Commonly known as:  ZOCOR Take 1 tablet (40 mg total) by mouth at bedtime.   sitaGLIPtin 50 MG tablet Commonly known as:  JANUVIA Take 1 tablet (50 mg total) by mouth daily.   trolamine salicylate 10 % cream Commonly known as:   ASPERCREME Apply topically as needed.   vitamin B-12 100 MCG tablet Commonly known as:  CYANOCOBALAMIN Take 100 mcg by mouth daily.          Objective:   Physical Exam BP (!) 158/45   Pulse 64   Temp 98.2 F (36.8 C) (Oral)   Resp 16   Ht 5' (1.524 m)   Wt 141 lb 3.2 oz (64 kg)   SpO2 93%   BMI 27.58 kg/m  General:   Well developed, well nourished, sitting in a wheelchair. Some distress when I tried to examine her.  HEENT:  Normocephalic . Face symmetric, atraumatic Lungs:  CTA B Normal respiratory effort, no intercostal retractions, no accessory muscle use. Heart: RRR,  no murmur.  No pretibial edema bilaterally . Decreased but palpable pedal pulses. Skin: Not pale. Not jaundice. No rash  Neurologic:  alert & oriented X3.  Speech normal, gait appropriate for age and unassisted. Moving all extremities. MSK: Very TTP throughout her back, lower extremities to either soft or more deep pressure. Shoulders, elbows, wrists and arms: No TTP. Psych--  Cognition and judgment appear intact.  Cooperative with normal attention span and concentration.  Behavior appropriate. No anxious or depressed appearing.      Assessment & Plan:     Assessment   DM, no neuropathy per foot exam 01-2015 HTN Hyperlipidemia Chronic renal insufficiency, declined to see nephrology consistently Anemia DJD Latent syphilis? S/p Penicillin 1993  PLAN: UTI with mental status changes: Symptoms resolve Lower extremity pain: Patient has significant pain at back and both legs throughout. Palpable pedal pulses, unable to obtain ABIs but TBI normal consequently PVD unlikely. Recent blood work including a normal B12, normal CKs, negative RPR, CT lumbar spine with DJD (unable to the MRIs due to cochlear implant) -She is on statins ---->  will stop them -PMR, spinal stenosis, others? Will get a sedimentation rate, CRP, folic acid, vitamin D repeat CKs. Once I get the results, will strongly consider  neurology or rheumatology referral. Pain control with Tylenol and nighttime hydrocodone. DM: Currently on Januvia only, since the last visit glimepiride was stopped HTN: Due to hyponatremia, diuretics were stopped. BP is slightly elevated today, for now we'll continue with clonidine twice a day.

## 2016-08-25 DIAGNOSIS — N184 Chronic kidney disease, stage 4 (severe): Secondary | ICD-10-CM | POA: Diagnosis not present

## 2016-08-25 DIAGNOSIS — M79661 Pain in right lower leg: Secondary | ICD-10-CM | POA: Diagnosis not present

## 2016-08-25 DIAGNOSIS — M79662 Pain in left lower leg: Secondary | ICD-10-CM | POA: Diagnosis not present

## 2016-08-25 DIAGNOSIS — M1711 Unilateral primary osteoarthritis, right knee: Secondary | ICD-10-CM | POA: Diagnosis not present

## 2016-08-25 DIAGNOSIS — E1122 Type 2 diabetes mellitus with diabetic chronic kidney disease: Secondary | ICD-10-CM | POA: Diagnosis not present

## 2016-08-25 DIAGNOSIS — I129 Hypertensive chronic kidney disease with stage 1 through stage 4 chronic kidney disease, or unspecified chronic kidney disease: Secondary | ICD-10-CM | POA: Diagnosis not present

## 2016-08-25 NOTE — Assessment & Plan Note (Signed)
UTI with mental status changes: Symptoms resolve Lower extremity pain: Patient has significant pain at back and both legs throughout. Palpable pedal pulses, unable to obtain ABIs but TBI normal consequently PVD unlikely. Recent blood work including a normal B12, normal CKs, negative RPR, CT lumbar spine with DJD (unable to the MRIs due to cochlear implant) -She is on statins ---->  will stop them -PMR, spinal stenosis, others? Will get a sedimentation rate, CRP, folic acid, vitamin D repeat CKs. Once I get the results, will strongly consider neurology or rheumatology referral. Pain control with Tylenol and nighttime hydrocodone. DM: Currently on Januvia only, since the last visit glimepiride was stopped HTN: Due to hyponatremia, diuretics were stopped. BP is slightly elevated today, for now we'll continue with clonidine twice a day.

## 2016-08-26 DIAGNOSIS — E1122 Type 2 diabetes mellitus with diabetic chronic kidney disease: Secondary | ICD-10-CM | POA: Diagnosis not present

## 2016-08-26 DIAGNOSIS — M79661 Pain in right lower leg: Secondary | ICD-10-CM | POA: Diagnosis not present

## 2016-08-26 DIAGNOSIS — M1711 Unilateral primary osteoarthritis, right knee: Secondary | ICD-10-CM | POA: Diagnosis not present

## 2016-08-26 DIAGNOSIS — I129 Hypertensive chronic kidney disease with stage 1 through stage 4 chronic kidney disease, or unspecified chronic kidney disease: Secondary | ICD-10-CM | POA: Diagnosis not present

## 2016-08-26 DIAGNOSIS — M79662 Pain in left lower leg: Secondary | ICD-10-CM | POA: Diagnosis not present

## 2016-08-26 DIAGNOSIS — N184 Chronic kidney disease, stage 4 (severe): Secondary | ICD-10-CM | POA: Diagnosis not present

## 2016-08-29 ENCOUNTER — Telehealth: Payer: Self-pay | Admitting: Internal Medicine

## 2016-08-29 DIAGNOSIS — M1711 Unilateral primary osteoarthritis, right knee: Secondary | ICD-10-CM | POA: Diagnosis not present

## 2016-08-29 DIAGNOSIS — E1122 Type 2 diabetes mellitus with diabetic chronic kidney disease: Secondary | ICD-10-CM | POA: Diagnosis not present

## 2016-08-29 DIAGNOSIS — N184 Chronic kidney disease, stage 4 (severe): Secondary | ICD-10-CM | POA: Diagnosis not present

## 2016-08-29 DIAGNOSIS — M79661 Pain in right lower leg: Secondary | ICD-10-CM | POA: Diagnosis not present

## 2016-08-29 DIAGNOSIS — M79662 Pain in left lower leg: Secondary | ICD-10-CM | POA: Diagnosis not present

## 2016-08-29 DIAGNOSIS — I129 Hypertensive chronic kidney disease with stage 1 through stage 4 chronic kidney disease, or unspecified chronic kidney disease: Secondary | ICD-10-CM | POA: Diagnosis not present

## 2016-08-29 MED ORDER — GLUCOSE BLOOD VI STRP: ORAL_STRIP | 12 refills | Status: DC

## 2016-08-29 MED ORDER — VITAMIN D (ERGOCALCIFEROL) 1.25 MG (50000 UNIT) PO CAPS: 50000.0000 [IU] | ORAL_CAPSULE | ORAL | 0 refills | Status: DC

## 2016-08-29 NOTE — Telephone Encounter (Signed)
Caller name: Mollie Relationship to patient:  Daughter Can be reached: [phone] Pharm[facility]:  [phone] [place] - [address] AT [facility] [address] [phone] (Phone) [phone] (Fax)     Reason for call: Request Rx for Freestyle Lite Monitor test strips

## 2016-08-29 NOTE — Addendum Note (Signed)
Addended byConrad Elkin: Shenaya Lebo D on: 08/29/2016 09:11 AM   Modules accepted: Orders

## 2016-08-29 NOTE — Telephone Encounter (Signed)
Rx sent 

## 2016-08-30 ENCOUNTER — Other Ambulatory Visit: Payer: Self-pay | Admitting: Internal Medicine

## 2016-08-30 DIAGNOSIS — M1711 Unilateral primary osteoarthritis, right knee: Secondary | ICD-10-CM | POA: Diagnosis not present

## 2016-08-30 DIAGNOSIS — I129 Hypertensive chronic kidney disease with stage 1 through stage 4 chronic kidney disease, or unspecified chronic kidney disease: Secondary | ICD-10-CM | POA: Diagnosis not present

## 2016-08-30 DIAGNOSIS — E1122 Type 2 diabetes mellitus with diabetic chronic kidney disease: Secondary | ICD-10-CM | POA: Diagnosis not present

## 2016-08-30 DIAGNOSIS — M79661 Pain in right lower leg: Secondary | ICD-10-CM | POA: Diagnosis not present

## 2016-08-30 DIAGNOSIS — M79662 Pain in left lower leg: Secondary | ICD-10-CM | POA: Diagnosis not present

## 2016-08-30 DIAGNOSIS — N184 Chronic kidney disease, stage 4 (severe): Secondary | ICD-10-CM | POA: Diagnosis not present

## 2016-08-31 ENCOUNTER — Encounter: Payer: Self-pay | Admitting: Neurology

## 2016-09-01 DIAGNOSIS — E1122 Type 2 diabetes mellitus with diabetic chronic kidney disease: Secondary | ICD-10-CM | POA: Diagnosis not present

## 2016-09-01 DIAGNOSIS — M1711 Unilateral primary osteoarthritis, right knee: Secondary | ICD-10-CM | POA: Diagnosis not present

## 2016-09-01 DIAGNOSIS — M79662 Pain in left lower leg: Secondary | ICD-10-CM | POA: Diagnosis not present

## 2016-09-01 DIAGNOSIS — M79661 Pain in right lower leg: Secondary | ICD-10-CM | POA: Diagnosis not present

## 2016-09-01 DIAGNOSIS — I129 Hypertensive chronic kidney disease with stage 1 through stage 4 chronic kidney disease, or unspecified chronic kidney disease: Secondary | ICD-10-CM | POA: Diagnosis not present

## 2016-09-01 DIAGNOSIS — N184 Chronic kidney disease, stage 4 (severe): Secondary | ICD-10-CM | POA: Diagnosis not present

## 2016-09-06 DIAGNOSIS — M79661 Pain in right lower leg: Secondary | ICD-10-CM | POA: Diagnosis not present

## 2016-09-06 DIAGNOSIS — M79662 Pain in left lower leg: Secondary | ICD-10-CM | POA: Diagnosis not present

## 2016-09-06 DIAGNOSIS — M1711 Unilateral primary osteoarthritis, right knee: Secondary | ICD-10-CM | POA: Diagnosis not present

## 2016-09-06 DIAGNOSIS — N184 Chronic kidney disease, stage 4 (severe): Secondary | ICD-10-CM | POA: Diagnosis not present

## 2016-09-06 DIAGNOSIS — E1122 Type 2 diabetes mellitus with diabetic chronic kidney disease: Secondary | ICD-10-CM | POA: Diagnosis not present

## 2016-09-06 DIAGNOSIS — I129 Hypertensive chronic kidney disease with stage 1 through stage 4 chronic kidney disease, or unspecified chronic kidney disease: Secondary | ICD-10-CM | POA: Diagnosis not present

## 2016-09-07 DIAGNOSIS — Z45321 Encounter for adjustment and management of cochlear device: Secondary | ICD-10-CM | POA: Diagnosis not present

## 2016-09-07 DIAGNOSIS — H903 Sensorineural hearing loss, bilateral: Secondary | ICD-10-CM | POA: Diagnosis not present

## 2016-09-08 ENCOUNTER — Encounter: Payer: Self-pay | Admitting: Neurology

## 2016-09-08 ENCOUNTER — Ambulatory Visit (INDEPENDENT_AMBULATORY_CARE_PROVIDER_SITE_OTHER): Payer: Medicare Other | Admitting: Neurology

## 2016-09-08 VITALS — BP 140/90 | HR 76 | Ht 60.0 in | Wt 140.0 lb

## 2016-09-08 DIAGNOSIS — M792 Neuralgia and neuritis, unspecified: Secondary | ICD-10-CM | POA: Diagnosis not present

## 2016-09-08 DIAGNOSIS — M791 Myalgia, unspecified site: Secondary | ICD-10-CM

## 2016-09-08 DIAGNOSIS — M79605 Pain in left leg: Secondary | ICD-10-CM | POA: Diagnosis not present

## 2016-09-08 DIAGNOSIS — M79604 Pain in right leg: Secondary | ICD-10-CM | POA: Diagnosis not present

## 2016-09-08 MED ORDER — GABAPENTIN 100 MG PO CAPS: ORAL_CAPSULE | ORAL | 5 refills | Status: DC

## 2016-09-08 NOTE — Progress Notes (Signed)
[facility] Clinic Note - Initial Visit   Date: 09/08/16  Jeanne Barker MRN: [id number] DOB: 1923-07-28   Dear Dr. Larose Kells:  Thank you for your kind referral of Jeanne Barker for consultation of bilateral leg pain. Although her history is well known to you, please allow [place] to reiterate it for the purpose of our medical record. The patient was accompanied to the clinic by daughter who also provides collateral information.     History of Present Illness: Jeanne Barker is a 83 y.o. right-handed African American female with diabetes mellitus, hypertension, and hyperlipidemia  presenting for evaluation of bilateral leg pain.    Patient is a highly independent and active 83 year old prior to mid-April.  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.  She was living alone, managing her own finances, and driving.  However, starting around late April, patient developed subacute onset of bilateral leg pain and sensitivity. She reports extreme pain even with light touch of the skin involving the entire leg (thigh, lower leg, and foot).  The pain is circumfrential and tends to be worse over the thighs.  She also has numbness which is worse with standing which makes her legs shake.  She complains of difficulty with balance and walking because the pain can be severe, but denies radicular pain. Her daughter states that even rubbing her legs causes severe pain.  She denies similar symptoms of the arms.  She reports having shingles over the anterior thigh about 5 years ago which is when her pain started, but the severity became significantly worse in April.  She denies having shingles involving the lower leg and foot, but complaints of burning pain over this area.  She denies any low back pain or incontinence.  She was admitted to [facility] from 5/3 - 5/8 for evaluation.  She underwent workup including TBI which were 0.97 on right  and 0.91 on left, ABIs were unable to be calculated due to patient intolerance, CT lumbar spine with moderate degenerative disc disease, but unlikely to be contributing to her current presentation. She was also found to have hyponatremia, sodium of 118. Sodium continued to improve with IV hydration. Diuretics were held. She was evaluated by physical therapy and occupational therapy who recommended home health PT and OT. Etiology of her bilateral lower extremity pain is unclear, but pain does appear to be localized to the skin with hyperalgesia. Since returning home, she uses a rollator to ambulate and has 24-hour supervision with family.    Out-side paper records, electronic medical record, and images have been reviewed where available and summarized as:  CT lumbar spine wo contrast 08/12/2016:   1. No acute osseous abnormality. 2. Moderate severe multilevel degenerative disc changes, most marked at L1-L2 and L2-L3. Moderate central and right paracentral disc bulging at L5-S1 with right greater than left foraminal narrowing. 3. Nonvisualization of the left kidney.  Lab Results  Component Value Date   ESRSEDRATE 16 08/24/2016    Lab Results  Component Value Date   CKTOTAL 62 08/24/2016   CKMB 1.4 01/10/2011   TROPONINI 0.03 (HH) 08/06/2016    Lab Results  Component Value Date   TSH 3.012 08/12/2016   Lab Results  Component Value Date   HGBA1C 7.3 (H) 08/07/2016   Lab Results  Component Value Date   VITAMINB12 2,246 (H) 08/15/2016    Past Medical History:  Diagnosis Date  . Anemia   .  Chronic renal insufficiency   . Diabetes mellitus   . Hard of hearing   . Hyperlipidemia   . Hypertension   . Osteoarthritis   . Syphilis    ?latent?, treated c/ PCN 1993    Past Surgical History:  Procedure Laterality Date  . ABDOMINAL HYSTERECTOMY     BSO  . CATARACT EXTRACTION    . cochlear impalnt  1/02  . NEPHRECTOMY     left  . OOPHORECTOMY       Medications:  Outpatient  Encounter Prescriptions as of 09/08/2016  Medication Sig  . acetaminophen (TYLENOL) 500 MG tablet Take 500 mg by mouth every 6 (six) hours as needed for mild pain.   Marland Kitchen aspirin 81 MG tablet Take 81 mg by mouth daily.    . cloNIDine (CATAPRES) 0.1 MG tablet Take 1 tablet (0.1 mg total) by mouth 2 (two) times daily.  Marland Kitchen glucose blood (FREESTYLE LITE) test strip Check blood sugar no more than twice daily  . HYDROcodone-acetaminophen (NORCO/VICODIN) 5-325 MG tablet Take 1-2 tablets by mouth at bedtime as needed.  Marland Kitchen RELION ULTRA THIN PLUS LANCETS MISC Check no more than twice daily  . simvastatin (ZOCOR) 40 MG tablet Take 1 tablet (40 mg total) by mouth at bedtime.  . sitaGLIPtin (JANUVIA) 50 MG tablet Take 1 tablet (50 mg total) by mouth daily.  Marland Kitchen trolamine salicylate (ASPERCREME) 10 % cream Apply topically as needed.  . vitamin B-12 (CYANOCOBALAMIN) 100 MCG tablet Take 100 mcg by mouth daily.   . Vitamin D, Ergocalciferol, (DRISDOL) 50000 units CAPS capsule Take 1 capsule (50,000 Units total) by mouth every 7 (seven) days.   No facility-administered encounter medications on file as of 09/08/2016.      Allergies:  Allergies  Allergen Reactions  . Amlodipine Besylate Nausea Only  . Lisinopril Other (See Comments)    REACTION: hyperkalemia    Family History: Family History  Problem Relation Age of Onset  . Other Brother   . Diabetes Mellitus II Brother   . CAD Neg Hx     Social History: Social History  Substance Use Topics  . Smoking status: Former Research scientist (life sciences)  . Smokeless tobacco: Never Used  . Alcohol use No   Social History   Social History Narrative   Patient still lives at home but someone is always with her.  Has 7 children.  Retired from Tax inspector.  Education: 3rd grade.    Her daughter Jeanne Barker [phone])  lives 20 minutes away and usually comes w/ the pt to the office visit    Review of Systems:  CONSTITUTIONAL: No fevers, chills, night sweats, or weight loss.     EYES: No visual changes or eye pain ENT: No hearing changes.  No history of nose bleeds.   RESPIRATORY: No cough, wheezing and shortness of breath.   CARDIOVASCULAR: Negative for chest pain, and palpitations.   GI: Negative for abdominal discomfort, blood in stools or black stools.  No recent change in bowel habits.   GU:  No history of incontinence.   MUSCLOSKELETAL: No history of joint pain or swelling.  +myalgias.   SKIN: Negative for lesions, rash, and itching.   HEMATOLOGY/ONCOLOGY: Negative for prolonged bleeding, bruising easily, and swollen nodes.  No history of cancer.   ENDOCRINE: Negative for cold or heat intolerance, polydipsia or goiter.   PSYCH:  No depression or anxiety symptoms.   NEURO: As Above.   Vital Signs:  BP 140/90   Pulse 76   Ht 5' (  1.524 m)   Wt 140 lb (63.5 kg)   SpO2 99%   BMI 27.34 kg/m    General Medical Exam:   General:  Elderly-appearing, comfortable in wheelchair.   Eyes/ENT: see cranial nerve examination.   Neck: No masses appreciated.  Full range of motion without tenderness.  No carotid bruits. Respiratory:  Clear to auscultation, good air entry bilaterally.   Cardiac:  Regular rate and rhythm, no murmur.   Extremities:  No deformities, edema, or skin discoloration.  Skin:  No rashes or lesions.  Neurological Exam: MENTAL STATUS including orientation to time, place, person, recent and remote memory, attention span and concentration, language, and fund of knowledge is normal.  Speech is affected by patient being edentulous.  CRANIAL NERVES: II:  No visual field defects.  Unremarkable fundi.   III-IV-VI: Pupils equal round and reactive to light.  Normal conjugate, extra-ocular eye movements in all directions of gaze.  No nystagmus.  No ptosis.   V:  Normal facial sensation.     VII:  Normal facial symmetry and movements.   VIII:  Normal hearing and vestibular function.   IX-X:  Normal palatal movement.   XI:  Normal shoulder shrug and  head rotation.   XII:  Normal tongue strength and range of motion, no deviation or fasciculation.  MOTOR: She has extreme sensitivity to light touch over the legs (thigh, leg, foot) bilaterally which does not fit a nerve distribution.  There is associated tenderness to muscle palpation.  No atrophy, fasciculations or abnormal movements.  No pronator drift.  Tone is normal.    Right Upper Extremity:    Left Upper Extremity:    Deltoid  5/5   Deltoid  5/5   Biceps  5/5   Biceps  5/5   Triceps  5/5   Triceps  5/5   Wrist extensors  5/5   Wrist extensors  5/5   Wrist flexors  5/5   Wrist flexors  5/5   Finger extensors  5/5   Finger extensors  5/5   Finger flexors  5/5   Finger flexors  5/5   Dorsal interossei  5/5   Dorsal interossei  5/5   Abductor pollicis  5/5   Abductor pollicis  5/5   Tone (Ashworth scale)  0  Tone (Ashworth scale)  0   Right Lower Extremity:    Left Lower Extremity:    Hip flexors  5/5   Hip flexors  5/5   Hip extensors  5/5   Hip extensors  5/5   Knee flexors  5/5   Knee flexors  5/5   Knee extensors  5/5   Knee extensors  5/5   Dorsiflexors  5/5   Dorsiflexors  5/5   Plantarflexors  5/5   Plantarflexors  5/5   Toe extensors  5/5   Toe extensors  5/5   Toe flexors  5/5   Toe flexors  5/5   Tone (Ashworth scale)  0  Tone (Ashworth scale)  0   MSRs:  Right                                                                 Left brachioradialis 2+  brachioradialis 2+  biceps 2+  biceps 2+  triceps 2+  triceps 2+  patellar 2+  patellar 2+  ankle jerk 1+  ankle jerk 1+  Hoffman no  Hoffman no  plantar response down  plantar response down   SENSORY:  Hyperesthesia to pin prick, light touch, and vibration in the legs.  Sensation intact in the arms.  COORDINATION/GAIT: Normal finger-to- nose-finger.  Intact rapid alternating movements bilaterally.  Unable to rise from a chair without using arms.  She is able to stand with two-person assist.  Gait not tested due to  patient's instability.   IMPRESSION: Ms. Slinker is a 83 year-old female referred for evaluation of bilateral leg pain.  Her pain is very diffuse and associated with myalgias which makes examination difficult, however I was able to accurately assess that her motor strength in the legs is 5/5 proximally and distally.  She has hyperesthesia to all sensory modalities in the legs, which is greater than expected for patients with neuropathy or radiculopathy.  Small fiber neuropathy can manifest with hyperesthesia, but would not expect myalgias or such wide-spread involvement.  CT lumbar spine was reviewed which showed severe multilevel degenerative disc changes, especially at at L1-L2 and L2-L3, but this would not explain paresthesias involving the lower leg and foot. Her CK, vitamin B12, TSH, and ESR was normal making myopathy and polymyalgia rheumatica unlikely.  We discussed obtaining electrodiagnostic testing, but she is too uncomfortable with simple bedside testing, and she would not be able to tolerate NCS/EMG, so this was not ordered.  Difficult case as there is no clear diagnosis.  I will try to manage her pain with low-dose of gabapentin 178m at bedtime and titrate to 2039mover two weeks, to see if we can provide any symptom management. In the meantime, she will continue home PT and OT. Once pain is better controlled, may consider electrodiagnostic testing at that time.     Patient to call with update in 1 month  Return to clinic in 3 months.   The duration of this appointment visit was 60 minutes of face-to-face time with the patient.  Greater than 50% of this time was spent in counseling, explanation of diagnosis, planning of further management, and coordination of care.   Thank you for allowing me to participate in patient's care.  If I can answer any additional questions, I would be pleased to do so.    Sincerely,    Donika K. PaPosey ProntoDO

## 2016-09-08 NOTE — Patient Instructions (Addendum)
Start gabapentin 100mg  1 tablet at bedtime for 1 week, then increase to 2 tablets at bedtime. Continue physical therapy  Call with an update 1 month  Return to clinic 3 months

## 2016-09-09 DIAGNOSIS — I129 Hypertensive chronic kidney disease with stage 1 through stage 4 chronic kidney disease, or unspecified chronic kidney disease: Secondary | ICD-10-CM | POA: Diagnosis not present

## 2016-09-09 DIAGNOSIS — M79661 Pain in right lower leg: Secondary | ICD-10-CM | POA: Diagnosis not present

## 2016-09-09 DIAGNOSIS — E1122 Type 2 diabetes mellitus with diabetic chronic kidney disease: Secondary | ICD-10-CM | POA: Diagnosis not present

## 2016-09-09 DIAGNOSIS — N184 Chronic kidney disease, stage 4 (severe): Secondary | ICD-10-CM | POA: Diagnosis not present

## 2016-09-09 DIAGNOSIS — M1711 Unilateral primary osteoarthritis, right knee: Secondary | ICD-10-CM | POA: Diagnosis not present

## 2016-09-09 DIAGNOSIS — M79662 Pain in left lower leg: Secondary | ICD-10-CM | POA: Diagnosis not present

## 2016-09-12 ENCOUNTER — Other Ambulatory Visit: Payer: Self-pay | Admitting: *Deleted

## 2016-09-12 ENCOUNTER — Telehealth: Payer: Self-pay | Admitting: Internal Medicine

## 2016-09-12 ENCOUNTER — Telehealth: Payer: Self-pay | Admitting: Neurology

## 2016-09-12 MED ORDER — NORTRIPTYLINE HCL 10 MG PO CAPS: 10.0000 mg | ORAL_CAPSULE | Freq: Every day | ORAL | 5 refills | Status: DC

## 2016-09-12 NOTE — Telephone Encounter (Signed)
Caller: Mollie (Daughter)  Urgent? Yes  Reason for the call: Patient's daughter called regarding side effects from her Gabapentin medication. She has been having Trembles. Please call. Thanks

## 2016-09-12 NOTE — Telephone Encounter (Signed)
Please advise 

## 2016-09-12 NOTE — Telephone Encounter (Signed)
Caller name: Mollie Relationship to patient: Daughter Can be reached: [phone] Pharmacy:  Reason for call: Daughter called and request that provider write a note stating that patient is out sick so that he employer will hold her job. Please call daughter when note is ready and she will pick up. States patient was working a part-time job prior to getting ill.

## 2016-09-12 NOTE — Telephone Encounter (Signed)
Patient's daughter given instructions and Rx sent to pharmacy. 

## 2016-09-12 NOTE — Telephone Encounter (Signed)
OK to stop the medication.  Once she is better, we can try low dose nortriptyline 10mg  at bedtime and see if this helps.  Side effects include sleepiness, lightheadedness, dry eyes/mouth, but usually at higher dosages - we are starting her on a low dose.   Jeanne K. Allena KatzPatel, DO

## 2016-09-12 NOTE — Telephone Encounter (Signed)
Patient's daughter said that her mom has been weak and shaky since starting the gabapentin.  They did not give it to her last night because she was so shaky.  Please advise.

## 2016-09-12 NOTE — Telephone Encounter (Signed)
Caller name: Felecia Janina Thompson Relationship to patient:  [facility] Can be reached: [phone] Pharmacy:  Reason for call: Verbal order to continue home health nursing 1 time a week for 5 weeks for

## 2016-09-12 NOTE — Telephone Encounter (Signed)
Spoke w/ Mollie, informed letter has been placed at front desk for pick up.

## 2016-09-12 NOTE — Telephone Encounter (Signed)
Send a letter To Whom It May Concern, Mrs. Jeanne Barker is a patient of mine, at this point, she is unable to work due to a illness and hopefully she will be able to go back to her job in the future.

## 2016-09-12 NOTE — Telephone Encounter (Signed)
Spoke w/ Inetta Fermoina, verbal orders given.

## 2016-09-14 DIAGNOSIS — M1711 Unilateral primary osteoarthritis, right knee: Secondary | ICD-10-CM | POA: Diagnosis not present

## 2016-09-14 DIAGNOSIS — I129 Hypertensive chronic kidney disease with stage 1 through stage 4 chronic kidney disease, or unspecified chronic kidney disease: Secondary | ICD-10-CM | POA: Diagnosis not present

## 2016-09-14 DIAGNOSIS — M79662 Pain in left lower leg: Secondary | ICD-10-CM | POA: Diagnosis not present

## 2016-09-14 DIAGNOSIS — M79661 Pain in right lower leg: Secondary | ICD-10-CM | POA: Diagnosis not present

## 2016-09-14 DIAGNOSIS — E1122 Type 2 diabetes mellitus with diabetic chronic kidney disease: Secondary | ICD-10-CM | POA: Diagnosis not present

## 2016-09-14 DIAGNOSIS — N184 Chronic kidney disease, stage 4 (severe): Secondary | ICD-10-CM | POA: Diagnosis not present

## 2016-09-16 ENCOUNTER — Telehealth: Payer: Self-pay | Admitting: Internal Medicine

## 2016-09-16 DIAGNOSIS — N184 Chronic kidney disease, stage 4 (severe): Secondary | ICD-10-CM | POA: Diagnosis not present

## 2016-09-16 DIAGNOSIS — M79661 Pain in right lower leg: Secondary | ICD-10-CM | POA: Diagnosis not present

## 2016-09-16 DIAGNOSIS — M1711 Unilateral primary osteoarthritis, right knee: Secondary | ICD-10-CM | POA: Diagnosis not present

## 2016-09-16 DIAGNOSIS — E1122 Type 2 diabetes mellitus with diabetic chronic kidney disease: Secondary | ICD-10-CM | POA: Diagnosis not present

## 2016-09-16 DIAGNOSIS — M79662 Pain in left lower leg: Secondary | ICD-10-CM | POA: Diagnosis not present

## 2016-09-16 DIAGNOSIS — I129 Hypertensive chronic kidney disease with stage 1 through stage 4 chronic kidney disease, or unspecified chronic kidney disease: Secondary | ICD-10-CM | POA: Diagnosis not present

## 2016-09-16 NOTE — Telephone Encounter (Signed)
Caller name:Pramod Relationship to patient:[facility] Can be reached:[phone] Pharmacy:  Reason for call:Requesting verbal orders for PT 1x a week for 1 week

## 2016-09-16 NOTE — Telephone Encounter (Signed)
Spoke w/ Pramod, verbal orders given.

## 2016-09-19 ENCOUNTER — Telehealth: Payer: Self-pay | Admitting: Neurology

## 2016-09-19 DIAGNOSIS — I129 Hypertensive chronic kidney disease with stage 1 through stage 4 chronic kidney disease, or unspecified chronic kidney disease: Secondary | ICD-10-CM | POA: Diagnosis not present

## 2016-09-19 DIAGNOSIS — M1711 Unilateral primary osteoarthritis, right knee: Secondary | ICD-10-CM | POA: Diagnosis not present

## 2016-09-19 DIAGNOSIS — E1122 Type 2 diabetes mellitus with diabetic chronic kidney disease: Secondary | ICD-10-CM | POA: Diagnosis not present

## 2016-09-19 DIAGNOSIS — N184 Chronic kidney disease, stage 4 (severe): Secondary | ICD-10-CM | POA: Diagnosis not present

## 2016-09-19 DIAGNOSIS — M79661 Pain in right lower leg: Secondary | ICD-10-CM | POA: Diagnosis not present

## 2016-09-19 DIAGNOSIS — M79662 Pain in left lower leg: Secondary | ICD-10-CM | POA: Diagnosis not present

## 2016-09-19 NOTE — Telephone Encounter (Signed)
PT's daughter called and wanted to let Dr Allena KatzPatel know she was doing well on the medication Nortriptyline, no pain or anything

## 2016-09-20 ENCOUNTER — Encounter: Payer: Self-pay | Admitting: Podiatry

## 2016-09-20 ENCOUNTER — Ambulatory Visit (INDEPENDENT_AMBULATORY_CARE_PROVIDER_SITE_OTHER): Payer: Medicare Other | Admitting: Podiatry

## 2016-09-20 DIAGNOSIS — L600 Ingrowing nail: Secondary | ICD-10-CM

## 2016-09-20 DIAGNOSIS — M79674 Pain in right toe(s): Secondary | ICD-10-CM

## 2016-09-20 DIAGNOSIS — B351 Tinea unguium: Secondary | ICD-10-CM

## 2016-09-20 DIAGNOSIS — M79675 Pain in left toe(s): Secondary | ICD-10-CM

## 2016-09-20 NOTE — Progress Notes (Signed)
Subjective: 83 y.o. returns the office today for painful, elongated, thickened toenails which she cannot trim herself. Denies any redness or drainage around the nails. Denies any acute changes since last appointment and no new complaints today. Denies any systemic complaints such as fevers, chills, nausea, vomiting.   Objective:  NAD DP/PT pulses palpable, CRT less than 3 seconds Nails hypertrophic, dystrophic, elongated, brittle, discolored 10. There is incurvation of multiple nails along the medial and lateral nail borders, mostly the hallux toenails. There is tenderness overlying the nails 1-5 bilaterally. There is no surrounding erythema or drainage along the nail sites. No open lesions or pre-ulcerative lesions are identified. No other areas of tenderness bilateral lower extremities. No overlying edema, erythema, increased warmth. No pain with calf compression, swelling, warmth, erythema.  Assessment: Patient presents with symptomatic onychomycosis  Plan: -Treatment options including alternatives, risks, complications were discussed -Nails sharply debrided 10 without complication/bleeding. -Discussed daily foot inspection. If there are any changes, to call the office immediately.  -Follow-up in 3 months or sooner if any problems are to arise. In the meantime, encouraged to call the office with any questions, concerns, changes symptoms.  Ovid CurdMatthew Tesean Stump, DPM

## 2016-09-20 NOTE — Telephone Encounter (Signed)
FYI

## 2016-09-20 NOTE — Telephone Encounter (Signed)
Noted. That's great to hear.

## 2016-09-21 ENCOUNTER — Ambulatory Visit (INDEPENDENT_AMBULATORY_CARE_PROVIDER_SITE_OTHER): Payer: Medicare Other | Admitting: Internal Medicine

## 2016-09-21 ENCOUNTER — Encounter: Payer: Self-pay | Admitting: Internal Medicine

## 2016-09-21 VITALS — BP 132/70 | HR 87 | Temp 98.7°F | Resp 12 | Ht 60.0 in | Wt 133.1 lb

## 2016-09-21 DIAGNOSIS — M79604 Pain in right leg: Secondary | ICD-10-CM

## 2016-09-21 DIAGNOSIS — M79605 Pain in left leg: Secondary | ICD-10-CM | POA: Diagnosis not present

## 2016-09-21 NOTE — Progress Notes (Signed)
Subjective:    Patient ID: Jeanne Barker, female    DOB: [birth year], 83 y.o.   MRN: [id number]  DOS:  09/21/2016 Type of visit - description : f/u  Interval history: Here with her daughter. Recent labs, neurology note reviewed. She is currently on Pamelor, symptoms much improved "more than 50%". No apparent side effects.  Review of Systems She is a little more dependent with her activities of daily living, needs help personal hygiene, cleaning, meal preparing, etc.   Past Medical History:  Diagnosis Date  . Anemia   . Chronic renal insufficiency   . Diabetes mellitus   . Hard of hearing   . Hyperlipidemia   . Hypertension   . Osteoarthritis   . Syphilis    ?latent?, treated c/ PCN 1993    Past Surgical History:  Procedure Laterality Date  . ABDOMINAL HYSTERECTOMY     BSO  . CATARACT EXTRACTION    . cochlear impalnt  1/02  . NEPHRECTOMY     left  . OOPHORECTOMY      Social History   Social History  . Marital status: Single    Spouse name: N/A  . Number of children: 7  . Years of education: 3rd grade   Occupational History  . PT job   .  Retired   Social History Main Topics  . Smoking status: Former Games developer  . Smokeless tobacco: Never Used  . Alcohol use No  . Drug use: No  . Sexual activity: No   Other Topics Concern  . Not on file   Social History Narrative   Patient still lives at home but someone is always with her.  Has 7 children.  Retired from Merchant [employer] officer.  Education: 3rd grade.    Her daughter Kirt Boys [phone])  lives 20 minutes away and usually comes w/ the pt to the office visit      Allergies as of 09/21/2016      Reactions   Amlodipine Besylate Nausea Only   Lisinopril Other (See Comments)   REACTION: hyperkalemia      Medication List       Accurate as of 09/21/16 11:59 PM. Always use your most recent med list.          acetaminophen 500 MG tablet Commonly known as:  TYLENOL Take 500 mg by mouth every 6 (six) hours  as needed for mild pain.   aspirin 81 MG tablet Take 81 mg by mouth daily.   cholecalciferol 1000 units tablet Commonly known as:  VITAMIN D Take 1,000 Units by mouth daily.   cloNIDine 0.1 MG tablet Commonly known as:  CATAPRES Take 1 tablet (0.1 mg total) by mouth 2 (two) times daily.   glucose blood test strip Commonly known as:  FREESTYLE LITE Check blood sugar no more than twice daily   nortriptyline 10 MG capsule Commonly known as:  PAMELOR Take 1 capsule (10 mg total) by mouth at bedtime.   RELION ULTRA THIN PLUS LANCETS Misc Check no more than twice daily   sitaGLIPtin 50 MG tablet Commonly known as:  JANUVIA Take 1 tablet (50 mg total) by mouth daily.   trolamine salicylate 10 % cream Commonly known as:  ASPERCREME Apply topically as needed.   vitamin B-12 100 MCG tablet Commonly known as:  CYANOCOBALAMIN Take 100 mcg by mouth daily.   Vitamin D (Ergocalciferol) 50000 units Caps capsule Commonly known as:  DRISDOL Take 1 capsule (50,000 Units total) by mouth every 7 (seven)  days.          Objective:   Physical Exam BP 132/70 (BP Location: Left Arm, Patient Position: Sitting, Cuff Size: Small)   Pulse 87   Temp 98.7 F (37.1 C) (Oral)   Resp 12   Ht 5' (1.524 m)   Wt 133 lb 2 oz (60.4 kg)   SpO2 95%   BMI 26.00 kg/m   General:   Well developed, well nourished, sitting in a wheelchair.  Seems more comfortable today. HEENT:  Normocephalic . Face symmetric, atraumatic Lungs:  CTA B Normal respiratory effort, no intercostal retractions, no accessory muscle use. Heart: RRR,  no murmur.  No pretibial edema bilaterally  Skin: Not pale. Not jaundice. No rash  Neurologic:  alert & oriented X3.  Speech normal, gait appropriate for age and unassisted. Moving all extremities. MSK: Not TTP when I examine her for edema. Psych--  Cognition and judgment appear intact.  Cooperative with normal attention span and concentration.  Behavior  appropriate. No anxious or depressed appearing. .      Assessment & Plan:    Assessment   DM, no neuropathy per foot exam 01-2015 HTN Hyperlipidemia Chronic renal insufficiency, declined to see nephrology consistently Lower extremity pain:   w/u and neurology visit 08/2016, intolerant to gabapentin, better with nortriptyline Anemia DJD Latent syphilis? S/p Penicillin 1993  PLAN: Lower extremity pain: Since the last visit: CRP and sedimentation rate were normal, vitamin D low. CT spine w/ DJD Saw neurology, felt she would not tolerate NCS/EMG, etiology unclear was rc gabapentin-PT/OT. She was prescribed gabapentin, was intolerant due to tremors. Now on nortriptyline and symptoms are much improved. We discussed possibly increase the dose but she has improved enough that she does not like to pursue that. Diabetic amyotrophy ?? Plan: Stop hydrocodone, stop simvastatin as previously recommended, continue with vitamin D weekly and also vitamin D daily.   RTC 3 months.

## 2016-09-21 NOTE — Progress Notes (Signed)
Pre visit review using our clinic review tool, if applicable. No additional management support is needed unless otherwise documented below in the visit note. 

## 2016-09-21 NOTE — Patient Instructions (Signed)
Come back in 3 months for a checkup, please  make an appointment.  Stop taking simvastatin (cholesterol medication) Stop taking hydrocodone  In addition to what you're doing, take vitamin D over-the-counter: 1000 units daily

## 2016-09-22 NOTE — Assessment & Plan Note (Signed)
Lower extremity pain: Since the last visit: CRP and sedimentation rate were normal, vitamin D low. CT spine w/ DJD Saw neurology, felt she would not tolerate NCS/EMG, etiology unclear was rc gabapentin-PT/OT. She was prescribed gabapentin, was intolerant due to tremors. Now on nortriptyline and symptoms are much improved. We discussed possibly increase the dose but she has improved enough that she does not like to pursue that. Diabetic amyotrophy ?? Plan: Stop hydrocodone, stop simvastatin as previously recommended, continue with vitamin D weekly and also vitamin D daily.   RTC 3 months.

## 2016-09-23 DIAGNOSIS — E1122 Type 2 diabetes mellitus with diabetic chronic kidney disease: Secondary | ICD-10-CM | POA: Diagnosis not present

## 2016-09-23 DIAGNOSIS — M79662 Pain in left lower leg: Secondary | ICD-10-CM | POA: Diagnosis not present

## 2016-09-23 DIAGNOSIS — M1711 Unilateral primary osteoarthritis, right knee: Secondary | ICD-10-CM | POA: Diagnosis not present

## 2016-09-23 DIAGNOSIS — M79661 Pain in right lower leg: Secondary | ICD-10-CM | POA: Diagnosis not present

## 2016-09-23 DIAGNOSIS — I129 Hypertensive chronic kidney disease with stage 1 through stage 4 chronic kidney disease, or unspecified chronic kidney disease: Secondary | ICD-10-CM | POA: Diagnosis not present

## 2016-09-23 DIAGNOSIS — N184 Chronic kidney disease, stage 4 (severe): Secondary | ICD-10-CM | POA: Diagnosis not present

## 2016-09-26 DIAGNOSIS — I129 Hypertensive chronic kidney disease with stage 1 through stage 4 chronic kidney disease, or unspecified chronic kidney disease: Secondary | ICD-10-CM | POA: Diagnosis not present

## 2016-09-26 DIAGNOSIS — M79661 Pain in right lower leg: Secondary | ICD-10-CM | POA: Diagnosis not present

## 2016-09-26 DIAGNOSIS — M79662 Pain in left lower leg: Secondary | ICD-10-CM | POA: Diagnosis not present

## 2016-09-26 DIAGNOSIS — E1122 Type 2 diabetes mellitus with diabetic chronic kidney disease: Secondary | ICD-10-CM | POA: Diagnosis not present

## 2016-09-26 DIAGNOSIS — M1711 Unilateral primary osteoarthritis, right knee: Secondary | ICD-10-CM | POA: Diagnosis not present

## 2016-09-26 DIAGNOSIS — N184 Chronic kidney disease, stage 4 (severe): Secondary | ICD-10-CM | POA: Diagnosis not present

## 2016-09-29 ENCOUNTER — Telehealth: Payer: Self-pay

## 2016-09-29 DIAGNOSIS — M1711 Unilateral primary osteoarthritis, right knee: Secondary | ICD-10-CM | POA: Diagnosis not present

## 2016-09-29 DIAGNOSIS — M79662 Pain in left lower leg: Secondary | ICD-10-CM | POA: Diagnosis not present

## 2016-09-29 DIAGNOSIS — E1122 Type 2 diabetes mellitus with diabetic chronic kidney disease: Secondary | ICD-10-CM | POA: Diagnosis not present

## 2016-09-29 DIAGNOSIS — I129 Hypertensive chronic kidney disease with stage 1 through stage 4 chronic kidney disease, or unspecified chronic kidney disease: Secondary | ICD-10-CM | POA: Diagnosis not present

## 2016-09-29 DIAGNOSIS — M79661 Pain in right lower leg: Secondary | ICD-10-CM | POA: Diagnosis not present

## 2016-09-29 DIAGNOSIS — N184 Chronic kidney disease, stage 4 (severe): Secondary | ICD-10-CM | POA: Diagnosis not present

## 2016-09-29 NOTE — Telephone Encounter (Signed)
[facility]/ Enrique [facility]ackKendra[facility] just calling to inform that they are moving PT appt to next week, Pt experiencing knee pain, Pt does have ortho appt on Monday, 6/25 for issue.

## 2016-09-30 ENCOUNTER — Inpatient Hospital Stay (HOSPITAL_COMMUNITY)
Admission: EM | Admit: 2016-09-30 | Discharge: 2016-10-02 | DRG: 689 | Disposition: A | Discharge status: Home / Self Care | Payer: Medicare Other | Attending: Internal Medicine | Admitting: Internal Medicine

## 2016-09-30 ENCOUNTER — Emergency Department (HOSPITAL_COMMUNITY): Payer: Medicare Other

## 2016-09-30 ENCOUNTER — Other Ambulatory Visit: Payer: Self-pay

## 2016-09-30 ENCOUNTER — Observation Stay (HOSPITAL_COMMUNITY): Payer: Medicare Other

## 2016-09-30 DIAGNOSIS — G9341 Metabolic encephalopathy: Secondary | ICD-10-CM | POA: Diagnosis not present

## 2016-09-30 DIAGNOSIS — N289 Disorder of kidney and ureter, unspecified: Secondary | ICD-10-CM | POA: Diagnosis not present

## 2016-09-30 DIAGNOSIS — R4182 Altered mental status, unspecified: Secondary | ICD-10-CM | POA: Diagnosis not present

## 2016-09-30 DIAGNOSIS — I10 Essential (primary) hypertension: Secondary | ICD-10-CM | POA: Diagnosis present

## 2016-09-30 DIAGNOSIS — G9340 Encephalopathy, unspecified: Secondary | ICD-10-CM | POA: Diagnosis present

## 2016-09-30 DIAGNOSIS — R29818 Other symptoms and signs involving the nervous system: Secondary | ICD-10-CM | POA: Diagnosis not present

## 2016-09-30 DIAGNOSIS — R531 Weakness: Secondary | ICD-10-CM | POA: Diagnosis not present

## 2016-09-30 DIAGNOSIS — N183 Chronic kidney disease, stage 3 unspecified: Secondary | ICD-10-CM | POA: Diagnosis present

## 2016-09-30 DIAGNOSIS — Z7982 Long term (current) use of aspirin: Secondary | ICD-10-CM

## 2016-09-30 DIAGNOSIS — Z888 Allergy status to other drugs, medicaments and biological substances status: Secondary | ICD-10-CM

## 2016-09-30 DIAGNOSIS — N3000 Acute cystitis without hematuria: Secondary | ICD-10-CM

## 2016-09-30 DIAGNOSIS — IMO0002 Reserved for concepts with insufficient information to code with codable children: Secondary | ICD-10-CM | POA: Diagnosis present

## 2016-09-30 DIAGNOSIS — Z79899 Other long term (current) drug therapy: Secondary | ICD-10-CM

## 2016-09-30 DIAGNOSIS — N390 Urinary tract infection, site not specified: Secondary | ICD-10-CM | POA: Diagnosis not present

## 2016-09-30 DIAGNOSIS — I639 Cerebral infarction, unspecified: Secondary | ICD-10-CM

## 2016-09-30 DIAGNOSIS — R4789 Other speech disturbances: Secondary | ICD-10-CM | POA: Diagnosis not present

## 2016-09-30 DIAGNOSIS — Z905 Acquired absence of kidney: Secondary | ICD-10-CM

## 2016-09-30 DIAGNOSIS — M79604 Pain in right leg: Secondary | ICD-10-CM | POA: Diagnosis present

## 2016-09-30 DIAGNOSIS — E860 Dehydration: Secondary | ICD-10-CM | POA: Diagnosis present

## 2016-09-30 DIAGNOSIS — R479 Unspecified speech disturbances: Secondary | ICD-10-CM

## 2016-09-30 DIAGNOSIS — H9190 Unspecified hearing loss, unspecified ear: Secondary | ICD-10-CM

## 2016-09-30 DIAGNOSIS — E1165 Type 2 diabetes mellitus with hyperglycemia: Secondary | ICD-10-CM | POA: Diagnosis not present

## 2016-09-30 DIAGNOSIS — E875 Hyperkalemia: Secondary | ICD-10-CM | POA: Diagnosis present

## 2016-09-30 DIAGNOSIS — E871 Hypo-osmolality and hyponatremia: Secondary | ICD-10-CM | POA: Diagnosis not present

## 2016-09-30 DIAGNOSIS — E1159 Type 2 diabetes mellitus with other circulatory complications: Secondary | ICD-10-CM

## 2016-09-30 DIAGNOSIS — J9811 Atelectasis: Secondary | ICD-10-CM | POA: Diagnosis not present

## 2016-09-30 DIAGNOSIS — H9193 Unspecified hearing loss, bilateral: Secondary | ICD-10-CM

## 2016-09-30 DIAGNOSIS — M79605 Pain in left leg: Secondary | ICD-10-CM | POA: Diagnosis present

## 2016-09-30 DIAGNOSIS — Z87891 Personal history of nicotine dependence: Secondary | ICD-10-CM

## 2016-09-30 LAB — GLUCOSE, CAPILLARY: GLUCOSE-CAPILLARY: 199 mg/dL — AB (ref 65–99)

## 2016-09-30 LAB — COMPREHENSIVE METABOLIC PANEL
ALBUMIN: 3.4 g/dL — AB (ref 3.5–5.0)
ALT: 13 U/L — ABNORMAL LOW (ref 14–54)
ANION GAP: 7 (ref 5–15)
AST: 15 U/L (ref 15–41)
Alkaline Phosphatase: 81 U/L (ref 38–126)
BILIRUBIN TOTAL: 0.5 mg/dL (ref 0.3–1.2)
BUN: 25 mg/dL — ABNORMAL HIGH (ref 6–20)
CHLORIDE: 99 mmol/L — AB (ref 101–111)
CO2: 24 mmol/L (ref 22–32)
Calcium: 9.6 mg/dL (ref 8.9–10.3)
Creatinine, Ser: 1.5 mg/dL — ABNORMAL HIGH (ref 0.44–1.00)
GFR calc Af Amer: 34 mL/min — ABNORMAL LOW (ref >60)
GFR calc non Af Amer: 29 mL/min — ABNORMAL LOW (ref >60)
GLUCOSE: 221 mg/dL — AB (ref 65–99)
POTASSIUM: 5.1 mmol/L (ref 3.5–5.1)
SODIUM: 130 mmol/L — AB (ref 135–145)
TOTAL PROTEIN: 6.9 g/dL (ref 6.5–8.1)

## 2016-09-30 LAB — I-STAT CHEM 8, ED
BUN: 29 mg/dL — AB (ref 6–20)
CREATININE: 1.4 mg/dL — AB (ref 0.44–1.00)
Calcium, Ion: 1.29 mmol/L (ref 1.15–1.40)
Chloride: 99 mmol/L — ABNORMAL LOW (ref 101–111)
Glucose, Bld: 223 mg/dL — ABNORMAL HIGH (ref 65–99)
HEMATOCRIT: 36 % (ref 36.0–46.0)
Hemoglobin: 12.2 g/dL (ref 12.0–15.0)
POTASSIUM: 5.2 mmol/L — AB (ref 3.5–5.1)
SODIUM: 131 mmol/L — AB (ref 135–145)
TCO2: 23 mmol/L (ref 0–100)

## 2016-09-30 LAB — APTT: APTT: 28 s (ref 24–36)

## 2016-09-30 LAB — DIFFERENTIAL
BASOS ABS: 0 10*3/uL (ref 0.0–0.1)
BASOS PCT: 0 %
EOS ABS: 0.1 10*3/uL (ref 0.0–0.7)
EOS PCT: 1 %
LYMPHS ABS: 1.3 10*3/uL (ref 0.7–4.0)
Lymphocytes Relative: 18 %
Monocytes Absolute: 0.5 10*3/uL (ref 0.1–1.0)
Monocytes Relative: 7 %
NEUTROS PCT: 74 %
Neutro Abs: 5 10*3/uL (ref 1.7–7.7)

## 2016-09-30 LAB — CBC
HCT: 34.9 % — ABNORMAL LOW (ref 36.0–46.0)
HEMOGLOBIN: 11.3 g/dL — AB (ref 12.0–15.0)
MCH: 26.9 pg (ref 26.0–34.0)
MCHC: 32.4 g/dL (ref 30.0–36.0)
MCV: 83.1 fL (ref 78.0–100.0)
PLATELETS: 313 10*3/uL (ref 150–400)
RBC: 4.2 MIL/uL (ref 3.87–5.11)
RDW: 13.1 % (ref 11.5–15.5)
WBC: 6.9 10*3/uL (ref 4.0–10.5)

## 2016-09-30 LAB — I-STAT TROPONIN, ED: Troponin i, poc: 0.01 ng/mL (ref 0.00–0.08)

## 2016-09-30 LAB — MAGNESIUM: MAGNESIUM: 1.7 mg/dL (ref 1.7–2.4)

## 2016-09-30 LAB — OSMOLALITY: Osmolality: 294 mOsm/kg (ref 275–295)

## 2016-09-30 LAB — PROTIME-INR
INR: 0.91
PROTHROMBIN TIME: 12.3 s (ref 11.4–15.2)

## 2016-09-30 MED ORDER — VITAMIN D (ERGOCALCIFEROL) 1.25 MG (50000 UNIT) PO CAPS
50000.0000 [IU] | ORAL_CAPSULE | ORAL | Status: DC
  Administered 2016-10-02: 50000 [IU] via ORAL
  Filled 2016-09-30: qty 1

## 2016-09-30 MED ORDER — CLONIDINE HCL 0.1 MG PO TABS
0.1000 mg | ORAL_TABLET | Freq: Two times a day (BID) | ORAL | Status: DC
  Administered 2016-10-01 – 2016-10-02 (×2): 0.1 mg via ORAL
  Filled 2016-09-30 (×4): qty 1

## 2016-09-30 MED ORDER — ACETAMINOPHEN 160 MG/5ML PO SOLN: 650.0000 mg | ORAL | Status: DC | PRN

## 2016-09-30 MED ORDER — ENOXAPARIN SODIUM 40 MG/0.4ML ~~LOC~~ SOLN: 40.0000 mg | SUBCUTANEOUS | Status: DC

## 2016-09-30 MED ORDER — LINAGLIPTIN 5 MG PO TABS
5.0000 mg | ORAL_TABLET | Freq: Every day | ORAL | Status: DC
  Administered 2016-10-01 – 2016-10-02 (×2): 5 mg via ORAL
  Filled 2016-09-30 (×2): qty 1

## 2016-09-30 MED ORDER — IOPAMIDOL (ISOVUE-370) INJECTION 76%
INTRAVENOUS | Status: AC
  Administered 2016-09-30: 50 mL
  Filled 2016-09-30: qty 100

## 2016-09-30 MED ORDER — ONDANSETRON HCL 4 MG/2ML IJ SOLN: 4.0000 mg | Freq: Three times a day (TID) | INTRAMUSCULAR | Status: DC | PRN

## 2016-09-30 MED ORDER — INSULIN ASPART 100 UNIT/ML ~~LOC~~ SOLN
0.0000 [IU] | SUBCUTANEOUS | Status: DC
  Administered 2016-09-30 – 2016-10-01 (×2): 2 [IU] via SUBCUTANEOUS
  Administered 2016-10-01 (×2): 1 [IU] via SUBCUTANEOUS
  Administered 2016-10-01: 3 [IU] via SUBCUTANEOUS
  Administered 2016-10-01: 1 [IU] via SUBCUTANEOUS
  Administered 2016-10-02 (×2): 2 [IU] via SUBCUTANEOUS

## 2016-09-30 MED ORDER — ENOXAPARIN SODIUM 30 MG/0.3ML ~~LOC~~ SOLN
30.0000 mg | SUBCUTANEOUS | Status: DC
  Administered 2016-09-30 – 2016-10-01 (×2): 30 mg via SUBCUTANEOUS
  Filled 2016-09-30 (×2): qty 0.3

## 2016-09-30 MED ORDER — ASPIRIN EC 81 MG PO TBEC
81.0000 mg | DELAYED_RELEASE_TABLET | Freq: Every day | ORAL | Status: DC
  Administered 2016-10-01 – 2016-10-02 (×2): 81 mg via ORAL
  Filled 2016-09-30 (×2): qty 1

## 2016-09-30 MED ORDER — NORTRIPTYLINE HCL 10 MG PO CAPS
10.0000 mg | ORAL_CAPSULE | Freq: Every day | ORAL | Status: DC
  Filled 2016-09-30 (×2): qty 1

## 2016-09-30 MED ORDER — ACETAMINOPHEN 325 MG PO TABS: 650.0000 mg | ORAL_TABLET | ORAL | Status: DC | PRN

## 2016-09-30 MED ORDER — HYDRALAZINE HCL 20 MG/ML IJ SOLN
10.0000 mg | Freq: Three times a day (TID) | INTRAMUSCULAR | Status: DC | PRN
  Administered 2016-09-30: 10 mg via INTRAVENOUS
  Filled 2016-09-30: qty 1

## 2016-09-30 MED ORDER — STROKE: EARLY STAGES OF RECOVERY BOOK: Freq: Once | Status: DC

## 2016-09-30 MED ORDER — ACETAMINOPHEN 650 MG RE SUPP: 650.0000 mg | RECTAL | Status: DC | PRN

## 2016-09-30 MED ORDER — SODIUM CHLORIDE 0.9 % IV SOLN
INTRAVENOUS | Status: DC
  Administered 2016-10-01 – 2016-10-02 (×2): via INTRAVENOUS

## 2016-09-30 MED ORDER — VITAMIN D 1000 UNITS PO TABS
1000.0000 [IU] | ORAL_TABLET | Freq: Every day | ORAL | Status: DC
  Administered 2016-10-01 – 2016-10-02 (×2): 1000 [IU] via ORAL
  Filled 2016-09-30 (×2): qty 1

## 2016-09-30 NOTE — Progress Notes (Addendum)
Patient got choked & vomited when RN gave patient her medication with apple sauce.  Patient daughter in the room, who requested that patient be given the applesauce with her medication.  Daughter advised that patient can not take her medication without applesauce.  Daughter advised that did not want patient to be given zofran, states patient was given it last time in hospital and it made the patient throw up even more  Daughter advised that patient has trouble swallowing at home for the past couple of weeks.  RN will continue to monitor patient.

## 2016-09-30 NOTE — ED Triage Notes (Signed)
Pt arrives from home via POV reporting changes to mentation and speech starting around 1100 today.  Pt's daughter reports pt speech was "slow to answer" this morning, states around 1100 pt became more slurred, incomprehensible speech, reported "funny feeling" to head.  L sided weakness noted in triage, Dr. Judd Lienelo asked to evaluate.  Code stroke activated.

## 2016-09-30 NOTE — ED Notes (Signed)
Helped family out with taking patient to check in, pt family member stated she has not been right since yestdrday, stated yesterday she was having "staring off spells" and today it got worse. Pt repeatedly asked when she was last seen normal and family stated it was "sometime yesterday she was her normal self, well shes not been herself for a few weeks, but yesterday she was her normal self".

## 2016-09-30 NOTE — Consult Note (Signed)
Requesting Physician: Dr. Rubin PayorPickering    Chief Complaint: Code stroke  History obtained from:  Patient   daughter  HPI:                                                                                                                                         Jeanne Barker is an 83 y.o. female who per daughter has been acting weaker than usual over the last 3 weeks. This AM she noted she was able to eat her breakfast but very slow in responses to answer questions. At 11 AM she noted significant decrease in verbal output, left sided weakness and leaning to the left. Patietn [facility]. In triage Code stroke was called. She was not a tPA candidate due to unclear LSN. Thus a CT perfusion and CTA head and neck were done. These were also negative thus a EEG was also ordered.   Date last known well: Unable to determine Time last known well: Unable to determine tPA Given: No: no LSN   Past Medical History:  Diagnosis Date  . Anemia   . Chronic renal insufficiency   . Diabetes mellitus   . Hard of hearing   . Hyperlipidemia   . Hypertension   . Osteoarthritis   . Syphilis    ?latent?, treated c/ PCN 1993    Past Surgical History:  Procedure Laterality Date  . ABDOMINAL HYSTERECTOMY     BSO  . CATARACT EXTRACTION    . cochlear impalnt  1/02  . NEPHRECTOMY     left  . OOPHORECTOMY      Family History  Problem Relation Age of Onset  . Other Brother   . Diabetes Mellitus II Brother   . CAD Neg Hx    Social History:  reports that she has quit smoking. She has never used smokeless tobacco. She reports that she does not drink alcohol or use drugs.  Allergies:  Allergies  Allergen Reactions  . Amlodipine Besylate Nausea Only  . Lisinopril Other (See Comments)    REACTION: hyperkalemia    Medications:                                                                                                                           Current Facility-Administered  Medications  Medication Dose Route Frequency Provider Last Rate Last Dose  .  iopamidol (ISOVUE-370) 76 % injection            Current Outpatient Prescriptions  Medication Sig Dispense Refill  . acetaminophen (TYLENOL) 500 MG tablet Take 500 mg by mouth every 6 (six) hours as needed for mild pain.     Marland Kitchen aspirin 81 MG tablet Take 81 mg by mouth daily.      . cholecalciferol (VITAMIN D) 1000 units tablet Take 1,000 Units by mouth daily.    . cloNIDine (CATAPRES) 0.1 MG tablet Take 1 tablet (0.1 mg total) by mouth 2 (two) times daily. 180 tablet 2  . glucose blood (FREESTYLE LITE) test strip Check blood sugar no more than twice daily (Patient not taking: Reported on 09/21/2016) 100 each 12  . nortriptyline (PAMELOR) 10 MG capsule Take 1 capsule (10 mg total) by mouth at bedtime. 30 capsule 5  . RELION ULTRA THIN PLUS LANCETS MISC Check no more than twice daily (Patient not taking: Reported on 09/21/2016) 100 each 12  . sitaGLIPtin (JANUVIA) 50 MG tablet Take 1 tablet (50 mg total) by mouth daily. 90 tablet 1  . trolamine salicylate (ASPERCREME) 10 % cream Apply topically as needed. 85 g 0  . vitamin B-12 (CYANOCOBALAMIN) 100 MCG tablet Take 100 mcg by mouth daily.     . Vitamin D, Ergocalciferol, (DRISDOL) 50000 units CAPS capsule Take 1 capsule (50,000 Units total) by mouth every 7 (seven) days. 12 capsule 0     ROS:                                                                                                                                       History obtained from unobtainable from patient due to mental status    Neurologic Examination:                                                                                                      Weight 58.7 kg (129 lb 6.6 oz).  HEENT-  Normocephalic, no lesions, without obvious abnormality.  Normal external eye and conjunctiva.  Normal TM's bilaterally.  Normal auditory canals and external ears. Normal external nose, mucus membranes and septum.   Normal pharynx. Cardiovascular- S1, S2 normal, pulses palpable throughout   Lungs- chest clear, no wheezing, rales, normal symmetric air entry, Heart exam - S1, S2 normal, no murmur, no gallop, rate regular Abdomen- normal findings: bowel sounds normal Extremities- no edema Lymph-no adenopathy palpable Musculoskeletal-no joint tenderness, deformity or swelling Skin-warm and dry, no hyperpigmentation, vitiligo,  or suspicious lesions  Neurological Examination Mental Status: Alert, able to follow simple commands.  Cranial Nerves: II: left field cut--left field cut resolved III,IV, VI: ptosis not present, extra-ocular motions intact bilaterally, pupils equal, round, reactive to light and accommodation V,VII: smile symmetric, facial light touch sensation normal bilaterally VIII: hearing decreased with cochlear inplant IX,X: uvula rises symmetrically XI: bilateral shoulder shrug XII: midline tongue extension Motor: Right : Upper extremity   5/5    Left:     Upper extremity   4/5  Lower extremity   5/5     Lower extremity   4/5 Tone and bulk:normal tone throughout; no atrophy noted Sensory: Pinprick and light touch intact throughout, bilaterally Deep Tendon Reflexes: 1+ and symmetric throughout Plantars: Right: downgoing   Left: downgoing Cerebellar: Finger to nose able but with tremor.  Gait: not tested       Lab Results: Basic Metabolic Panel:  Recent Labs Lab 09/30/16 1344  NA 131*  K 5.2*  CL 99*  GLUCOSE 223*  BUN 29*  CREATININE 1.40*    Liver Function Tests: No results for input(s): AST, ALT, ALKPHOS, BILITOT, PROT, ALBUMIN in the last 168 hours. No results for input(s): LIPASE, AMYLASE in the last 168 hours. No results for input(s): AMMONIA in the last 168 hours.  CBC:  Recent Labs Lab 09/30/16 1331 09/30/16 1344  WBC 6.9  --   NEUTROABS 5.0  --   HGB 11.3* 12.2  HCT 34.9* 36.0  MCV 83.1  --   PLT 313  --     Cardiac Enzymes: No results for  input(s): CKTOTAL, CKMB, CKMBINDEX, TROPONINI in the last 168 hours.  Lipid Panel: No results for input(s): CHOL, TRIG, HDL, CHOLHDL, VLDL, LDLCALC in the last 168 hours.  CBG: No results for input(s): GLUCAP in the last 168 hours.  Microbiology: Results for orders placed or performed during the hospital encounter of 08/06/16  Culture, Urine     Status: Abnormal   Collection Time: 08/06/16 12:32 PM  Result Value Ref Range Status   Specimen Description URINE, RANDOM  Final   Special Requests NONE  Final   Culture >=100,000 COLONIES/mL ESCHERICHIA COLI (A)  Final   Report Status 08/08/2016 FINAL  Final   Organism ID, Bacteria ESCHERICHIA COLI (A)  Final      Susceptibility   Escherichia coli - MIC*    AMPICILLIN >=32 RESISTANT Resistant     CEFAZOLIN <=4 SENSITIVE Sensitive     CEFTRIAXONE <=1 SENSITIVE Sensitive     CIPROFLOXACIN >=4 RESISTANT Resistant     GENTAMICIN <=1 SENSITIVE Sensitive     IMIPENEM <=0.25 SENSITIVE Sensitive     NITROFURANTOIN <=16 SENSITIVE Sensitive     TRIMETH/SULFA >=320 RESISTANT Resistant     AMPICILLIN/SULBACTAM >=32 RESISTANT Resistant     PIP/TAZO <=4 SENSITIVE Sensitive     Extended ESBL NEGATIVE Sensitive     * >=100,000 COLONIES/mL ESCHERICHIA COLI  Culture, blood (Routine X 2) w Reflex to ID Panel     Status: None   Collection Time: 08/06/16  1:07 PM  Result Value Ref Range Status   Specimen Description BLOOD LEFT ANTECUBITAL  Final   Special Requests IN PEDIATRIC BOTTLE Blood Culture adequate volume  Final   Culture NO GROWTH 5 DAYS  Final   Report Status 08/11/2016 FINAL  Final  Culture, blood (Routine X 2) w Reflex to ID Panel     Status: None   Collection Time: 08/06/16  1:07 PM  Result Value Ref  Range Status   Specimen Description BLOOD LEFT WRIST  Final   Special Requests IN PEDIATRIC BOTTLE Blood Culture adequate volume  Final   Culture NO GROWTH 5 DAYS  Final   Report Status 08/11/2016 FINAL  Final    Coagulation  Studies:  Recent Labs  09/30/16 1331  LABPROT 12.3  INR 0.91    Imaging: Ct Head Code Stroke W/o Cm  Result Date: 09/30/2016 CLINICAL DATA:  Code stroke.  Slurred speech and left-sided weakness EXAM: CT HEAD WITHOUT CONTRAST TECHNIQUE: Contiguous axial images were obtained from the base of the skull through the vertex without intravenous contrast. COMPARISON:  CT 08/06/2016 FINDINGS: Brain: Streak artifact from a right-sided cochlear implant obscures much of the posterior fossa and both temporal and occipital lobes.No mass lesion, intraparenchymal hemorrhage or extra-axial collection. No evidence of acute cortical infarct. There is mild for age periventricular hypoattenuation compatible with chronic microvascular disease. Vascular: No hyperdense vessel. Bilateral atherosclerotic calcification of the carotid arteries at skullbase. Skull: Normal visualized skull base, calvarium and extracranial soft tissues. Sinuses/Orbits: No sinus fluid levels or advanced mucosal thickening. No mastoid effusion. Normal orbits. ASPECTS [facility] Stroke Program Early CT Score) - Ganglionic level infarction (caudate, lentiform nuclei, internal capsule, insula, M1-M3 cortex): 7 - Supraganglionic infarction (M4-M6 cortex): 3 Total score (0-10 with 10 being normal): 10 IMPRESSION: 1. No acute intracranial abnormality. Streak artifact from right-sided cochlear implant limits assessment of the posterior fossa and right temporal lobe. 2. ASPECTS is 10. These results were called by telephone at the time of interpretation on 09/30/2016 at 2:01 pm to Dr. Geoffery Lyons , who verbally acknowledged these results. Electronically Signed   By: Deatra Robinson M.D.   On: 09/30/2016 14:07       Assessment and plan discussed with with attending physician and they are in agreement.    Felicie Morn PA-C [facility] [phone]  09/30/2016, 2:15 PM   Assessment: 83 y.o. female with left-sided neglect and hemianopsia. I  suspect that she has an ischemic stroke, though this was not apparent on CT perfusion. She did improve following the CT perfusion. A stat EEG was obtained to rule out ongoing status epilepticus given the persistent deficits, which was negative. She continued to improve, and I suspect TIA versus mild stroke.  Stroke Risk Factors - diabetes mellitus, hyperlipidemia and hypertension  1. HgbA1c, fasting lipid panel 2. MRI  of the brain without contrast 3. Frequent neuro checks 4. Echocardiogram 5. Carotid dopplers are not needed given the CT angiogram 6. Prophylactic therapy-Antiplatelet med: Aspirin - dose 325mg  PO or 300mg  PR 7. Risk factor modification 8. Telemetry monitoring 9. PT consult, OT consult, Speech consult 10. please page stroke NP  Or  PA  Or MD  from 8am -4 pm as this patient will be followed by the stroke team at this point.   You can look them up on [email or web address]    Ritta Slot, MD [facility] [phone]  If 7pm- 7am, please page neurology on call as listed in AMION.

## 2016-09-30 NOTE — Progress Notes (Signed)
Pt ate two bites of applesauce and vomited small amount x 3.  She denies nausea but states that she became sick because the applesauce was cold.  Sent message to Dr. Melynda RippleHobbs requesting meds.

## 2016-09-30 NOTE — H&P (Signed)
History and Physical    Jeanne Barker ZOX:[id number] DOB: May 01, 1923 DOA: 09/30/2016   PCP: [name], MD   Patient coming from/Resides with: Private residence/family  Chief Complaint: Confusion and altered mental status  HPI: Jeanne Barker is a 83 y.o. female with medical history significant for hypertension, diabetes 2 on oral agents, hard of hearing with cochlear implant, osteoarthritis, stage III chronic kidney disease, but syphilis, and history of bilateral leg pain. Patient was admitted in April with altered mentation and a full ischemic stroke evaluation was undertaken minus the MRI (apparently unable to complete secondary to cochlear implant) and it was felt her altered mentation was due to acute UTI. He was readmitted to the hospital one week later with bilateral leg pain. ABIs were normal. As an outpatient she was referred to neurology due to leg pain. Her gabapentin was discontinued due to tremors. She was started on nortriptyline with significant improvement in her symptoms. Her vitamin D was low and she was continued on supplementation. She returns to the ER today with acute altered mentation onset this a.m. Daughter noted she was confused and very slow to respond. She has been participating with PT/OT and having a home health RN since discharge although over the past week the daughter reports due to increasing weakness the patient has been unable to tolerate PT. Daughter did not patient with poor oral intake. Patient does tell me currently that she is nauseated. She is not had any GI symptoms such as vomiting or diarrhea at home. In the ER CT of head negative. She's been seen by neurology who have ordered an EEG. She does appear to be dehydrated with a BUN of 29 and a creatinine 1.4 and she is also hyperglycemic with a glucose of 223. The only other complaint the patient has had his left side of her head "felt funny" this morning  ED Course:  Vital Signs: Wt 58.7 kg (129 lb 6.6 oz)    BMI 25.27 kg/m Resp 17, Pulse 97, O2 sat 100% CT head without contrast: No acute intracranial abnormality CTA head and neck: No acute findings, streak artifact from right-sided cochlear implant limits evaluation of right temporal lobe, brainstem and cerebellum on the CT perfusion study. In the visualized portions of the brain no perfusion abnormality seen. There was noted to be atherosclerosis with calcified plaque in the left carotid bifurcation and the carotid siphons Lab data: Sodium 130, potassium 5.1, chloride 99, CO2 24, glucose 221, BUN 25 creatinine 1.5, calcium 9.6, albumin 3.4, poc troponin 0.01, white count 6900 with normal differential, hemoglobin 11.3, platelets 313,000 Medications and treatments: None  Review of Systems:  In addition to the HPI above,  No Fever-chills, myalgias or other constitutional symptoms No Headache, changes with Vision or hearing, new focal weakness reported by patient, no tingling, numbness in any extremity, dizziness, no true dysarthria or word finding difficulty although family member reports slow to respond early this morning, no new gait disturbance or imbalance, tremors or seizure activity No problems swallowing food or Liquids, indigestion/reflux, choking or coughing while eating, abdominal pain with or after eating No Chest pain, Cough or Shortness of Breath, palpitations, orthopnea or DOE No Abdominal pain, emesis, melena,hematochezia, dark tarry stools, constipation No dysuria, malodorous urine, hematuria or flank pain No new skin rashes, lesions, masses or bruises, No new joint pains, aches, swelling or redness No recent unintentional weight gain or loss No polyuria, polydypsia or polyphagia   Past Medical History:  Diagnosis Date  .  Anemia   . Chronic renal insufficiency   . Diabetes mellitus   . Hard of hearing   . Hyperlipidemia   . Hypertension   . Osteoarthritis   . Syphilis    ?latent?, treated c/ PCN 1993    Past Surgical  History:  Procedure Laterality Date  . ABDOMINAL HYSTERECTOMY     BSO  . CATARACT EXTRACTION    . cochlear impalnt  1/02  . NEPHRECTOMY     left  . OOPHORECTOMY      Social History   Social History  . Marital status: Single    Spouse name: N/A  . Number of children: 7  . Years of education: 3rd grade   Occupational History  . PT job   .  Retired   Social History Main Topics  . Smoking status: Former Games developermoker  . Smokeless tobacco: Never Used  . Alcohol use No  . Drug use: No  . Sexual activity: No   Other Topics Concern  . Not on file   Social History Narrative   Patient still lives at home but someone is always with her.  Has 7 children.  Retired from [employer].  Education: 3rd grade.    Her daughter [name] [phone])  lives 20 minutes away and usually comes w/ the pt to the office visit    Mobility: Rolling walker-current Health PT/OT Services Work history: Not obtained   Allergies  Allergen Reactions  . Amlodipine Besylate Nausea Only  . Lisinopril Other (See Comments)    REACTION: hyperkalemia    Family History  Problem Relation Age of Onset  . Other Brother   . Diabetes Mellitus II Brother   . CAD Neg Hx      Prior to Admission medications   Medication Sig Start Date End Date Taking? Authorizing Provider  acetaminophen (TYLENOL) 500 MG tablet Take 500 mg by mouth every 6 (six) hours as needed for mild pain.     [provider]  aspirin 81 MG tablet Take 81 mg by mouth daily.      [provider]  cholecalciferol (VITAMIN D) 1000 units tablet Take 1,000 Units by mouth daily.    [provider]  cloNIDine (CATAPRES) 0.1 MG tablet Take 1 tablet (0.1 mg total) by mouth 2 (two) times daily. 07/19/16   Wanda PlumpPaz, Jose E, MD  glucose blood (FREESTYLE LITE) test strip Check blood sugar no more than twice daily Patient not taking: Reported on 09/21/2016 08/29/16   Wanda PlumpPaz, Jose E, MD  nortriptyline (PAMELOR) 10 MG capsule Take 1  capsule (10 mg total) by mouth at bedtime. 09/12/16   Patel, Roxana Hiresonika K, DO  RELION ULTRA THIN PLUS LANCETS MISC Check no more than twice daily Patient not taking: Reported on 09/21/2016 06/30/14   Wanda PlumpPaz, Jose E, MD  sitaGLIPtin (JANUVIA) 50 MG tablet Take 1 tablet (50 mg total) by mouth daily. 06/20/16   Wanda PlumpPaz, Jose E, MD  trolamine salicylate (ASPERCREME) 10 % cream Apply topically as needed. 12/06/12   Kelle DartingWeaver, Layne C, NP  vitamin B-12 (CYANOCOBALAMIN) 100 MCG tablet Take 100 mcg by mouth daily.     [provider]  Vitamin D, Ergocalciferol, (DRISDOL) 50000 units CAPS capsule Take 1 capsule (50,000 Units total) by mouth every 7 (seven) days. 08/29/16   Wanda PlumpPaz, Jose E, MD    Physical Exam: Vitals:   09/30/16 1300  Weight: 58.7 kg (129 lb 6.6 oz)      Constitutional: NAD, calm, comfortable Eyes: PERRL, lids  and conjunctivae normal ENMT: Mucous membranes are dry. Posterior pharynx clear of any exudate or lesions.Edentulous. HOH-right cochlear implant Neck: normal, supple, no masses, no thyromegaly Respiratory: clear to auscultation bilaterally, no wheezing, no crackles. Normal respiratory effort. No accessory muscle use.  Cardiovascular: Regular rate and rhythm, no murmurs / rubs / gallops. No extremity edema. 2+ pedal pulses. No carotid bruits.  Abdomen: no tenderness, no masses palpated. No hepatosplenomegaly. Bowel sounds positive.  Musculoskeletal: no clubbing / cyanosis. No joint deformity upper and lower extremities. Good ROM, no contractures. Normal muscle tone.  Skin: no rashes, lesions, ulcers. No induration Neurologic: CN 2-12 grossly intact except for documented hearing loss which also limits patient's ability to participate effectively with exam. Sensation intact, DTR normal. Strength 4/5 although appears to have decreased grip and movement left upper extremity but uncertain if this is related to underlying pain issues Psychiatric: Awake and oriented times name. Normal mood.     Labs on Admission: I have personally reviewed following labs and imaging studies  CBC:  Recent Labs Lab 09/30/16 1331 09/30/16 1344  WBC 6.9  --   NEUTROABS 5.0  --   HGB 11.3* 12.2  HCT 34.9* 36.0  MCV 83.1  --   PLT 313  --    Basic Metabolic Panel:  Recent Labs Lab 09/30/16 1331 09/30/16 1344  NA 130* 131*  K 5.1 5.2*  CL 99* 99*  CO2 24  --   GLUCOSE 221* 223*  BUN 25* 29*  CREATININE 1.50* 1.40*  CALCIUM 9.6  --    GFR: Estimated Creatinine Clearance: 21 mL/min (A) (by C-G formula based on SCr of 1.4 mg/dL (H)). Liver Function Tests:  Recent Labs Lab 09/30/16 1331  AST 15  ALT 13*  ALKPHOS 81  BILITOT 0.5  PROT 6.9  ALBUMIN 3.4*   No results for input(s): LIPASE, AMYLASE in the last 168 hours. No results for input(s): AMMONIA in the last 168 hours. Coagulation Profile:  Recent Labs Lab 09/30/16 1331  INR 0.91   Cardiac Enzymes: No results for input(s): CKTOTAL, CKMB, CKMBINDEX, TROPONINI in the last 168 hours. BNP (last 3 results) No results for input(s): PROBNP in the last 8760 hours. HbA1C: No results for input(s): HGBA1C in the last 72 hours. CBG: No results for input(s): GLUCAP in the last 168 hours. Lipid Profile: No results for input(s): CHOL, HDL, LDLCALC, TRIG, CHOLHDL, LDLDIRECT in the last 72 hours. Thyroid Function Tests: No results for input(s): TSH, T4TOTAL, FREET4, T3FREE, THYROIDAB in the last 72 hours. Anemia Panel: No results for input(s): VITAMINB12, FOLATE, FERRITIN, TIBC, IRON, RETICCTPCT in the last 72 hours. Urine analysis:    Component Value Date/Time   COLORURINE YELLOW 08/06/2016 1200   APPEARANCEUR HAZY (A) 08/06/2016 1200   LABSPEC 1.005 08/06/2016 1200   PHURINE 6.0 08/06/2016 1200   GLUCOSEU 50 (A) 08/06/2016 1200   HGBUR NEGATIVE 08/06/2016 1200   BILIRUBINUR NEGATIVE 08/06/2016 1200   KETONESUR NEGATIVE 08/06/2016 1200   PROTEINUR NEGATIVE 08/06/2016 1200   UROBILINOGEN 0.2 06/18/2014 2200    NITRITE NEGATIVE 08/06/2016 1200   LEUKOCYTESUR SMALL (A) 08/06/2016 1200   Sepsis Labs: @LABRCNTIP (procalcitonin:4,lacticidven:4) )No results found for this or any previous visit (from the past 240 hour(s)).   Radiological Exams on Admission: Ct Angio Head W Or Wo Contrast  Result Date: 09/30/2016 CLINICAL DATA:  83 y/o  F; slurred speech and left-sided weakness. EXAM: CT ANGIOGRAPHY HEAD AND NECK CT PERFUSION BRAIN TECHNIQUE: Multidetector CT imaging of the head and neck was  performed using the standard protocol during bolus administration of intravenous contrast. Multiplanar CT image reconstructions and MIPs were obtained to evaluate the vascular anatomy. Carotid stenosis measurements (when applicable) are obtained utilizing NASCET criteria, using the distal internal carotid diameter as the denominator. Multiphase CT imaging of the brain was performed following IV bolus contrast injection. Subsequent parametric perfusion maps were calculated using RAPID software. CONTRAST:  Cta head and neck 50 cc Isovue 370. CT perfusion 40 cc Isovue 370. COMPARISON:  09/30/2016 CT head FINDINGS: CTA NECK FINDINGS Aortic arch: Standard branching. Imaged portion shows no evidence of aneurysm or dissection. No significant stenosis of the major arch vessel origins. Right carotid system: No evidence of dissection, stenosis (50% or greater) or occlusion. Left carotid system: No evidence of dissection, stenosis (50% or greater) or occlusion. Calcified plaque of the carotid bifurcation with minimal less than 30% stenosis of proximal ICA. Vertebral arteries: Left dominant. No evidence of dissection, stenosis (50% or greater) or occlusion. Skeleton: Grade 1 C4-5 anterolisthesis and prominent C4-5 facet arthropathy. No high-grade bony canal stenosis. Other neck: Negative. Upper chest: Negative. Review of the MIP images confirms the above findings CTA HEAD FINDINGS Anterior circulation: Moderate calcific atherosclerosis of the  cavernous and paraclinoid internal carotid artery is with short segment of mild-to-moderate left-sided paraclinoid stenosis. No significant stenosis, proximal vessel occlusion, or aneurysm. M2 inferior division short segment of mild ectasia measuring 3 mm (series 12, image 14). Posterior circulation: No significant stenosis, proximal occlusion, aneurysm, or vascular malformation. Venous sinuses: As permitted by contrast timing, patent. Anatomic variants: Right posterior communicating and anterior communicating artery is patent. No left posterior communicating artery identified, hypoplastic or absent. Review of the MIP images confirms the above findings CT Brain Perfusion Findings: Streak artifact from right-sided cochlear implant limits evaluation of right temporal lobe, brainstem, and cerebellum. CBF (<30%) Volume: 0mL Perfusion (Tmax>6.0s) volume: 0mL Mismatch Volume: 0mL Infarction Location:NA. IMPRESSION: 1. Patent circle of Willis. No large vessel occlusion, aneurysm, or significant stenosis is identified. 2. Patent carotid and vertebral arteries. No dissection, aneurysm, or significant stenosis. 3. Streak artifact from right-sided cochlear implant limits evaluation of right temporal lobe, brainstem, and cerebellum on the CT perfusion study. In the visualized portions of the brain there is no perfusion abnormality. 4. Atherosclerosis with calcified plaque of left carotid bifurcation and the carotid siphons. Minimal ectasia of left inferior division M2 is also likely related to atherosclerotic disease. These results were called by telephone at the time of interpretation on 09/30/2016 at 2:38 pm to Dr. Rubin Payor, who verbally acknowledged these results. Electronically Signed   By: Mitzi Hansen M.D.   On: 09/30/2016 14:46   Ct Angio Neck W Or Wo Contrast  Result Date: 09/30/2016 CLINICAL DATA:  83 y/o  F; slurred speech and left-sided weakness. EXAM: CT ANGIOGRAPHY HEAD AND NECK CT PERFUSION BRAIN  TECHNIQUE: Multidetector CT imaging of the head and neck was performed using the standard protocol during bolus administration of intravenous contrast. Multiplanar CT image reconstructions and MIPs were obtained to evaluate the vascular anatomy. Carotid stenosis measurements (when applicable) are obtained utilizing NASCET criteria, using the distal internal carotid diameter as the denominator. Multiphase CT imaging of the brain was performed following IV bolus contrast injection. Subsequent parametric perfusion maps were calculated using RAPID software. CONTRAST:  Cta head and neck 50 cc Isovue 370. CT perfusion 40 cc Isovue 370. COMPARISON:  09/30/2016 CT head FINDINGS: CTA NECK FINDINGS Aortic arch: Standard branching. Imaged portion shows no evidence of aneurysm or dissection.  No significant stenosis of the major arch vessel origins. Right carotid system: No evidence of dissection, stenosis (50% or greater) or occlusion. Left carotid system: No evidence of dissection, stenosis (50% or greater) or occlusion. Calcified plaque of the carotid bifurcation with minimal less than 30% stenosis of proximal ICA. Vertebral arteries: Left dominant. No evidence of dissection, stenosis (50% or greater) or occlusion. Skeleton: Grade 1 C4-5 anterolisthesis and prominent C4-5 facet arthropathy. No high-grade bony canal stenosis. Other neck: Negative. Upper chest: Negative. Review of the MIP images confirms the above findings CTA HEAD FINDINGS Anterior circulation: Moderate calcific atherosclerosis of the cavernous and paraclinoid internal carotid artery is with short segment of mild-to-moderate left-sided paraclinoid stenosis. No significant stenosis, proximal vessel occlusion, or aneurysm. M2 inferior division short segment of mild ectasia measuring 3 mm (series 12, image 14). Posterior circulation: No significant stenosis, proximal occlusion, aneurysm, or vascular malformation. Venous sinuses: As permitted by contrast timing,  patent. Anatomic variants: Right posterior communicating and anterior communicating artery is patent. No left posterior communicating artery identified, hypoplastic or absent. Review of the MIP images confirms the above findings CT Brain Perfusion Findings: Streak artifact from right-sided cochlear implant limits evaluation of right temporal lobe, brainstem, and cerebellum. CBF (<30%) Volume: 0mL Perfusion (Tmax>6.0s) volume: 0mL Mismatch Volume: 0mL Infarction Location:NA. IMPRESSION: 1. Patent circle of Willis. No large vessel occlusion, aneurysm, or significant stenosis is identified. 2. Patent carotid and vertebral arteries. No dissection, aneurysm, or significant stenosis. 3. Streak artifact from right-sided cochlear implant limits evaluation of right temporal lobe, brainstem, and cerebellum on the CT perfusion study. In the visualized portions of the brain there is no perfusion abnormality. 4. Atherosclerosis with calcified plaque of left carotid bifurcation and the carotid siphons. Minimal ectasia of left inferior division M2 is also likely related to atherosclerotic disease. These results were called by telephone at the time of interpretation on 09/30/2016 at 2:38 pm to Dr. Rubin Payor, who verbally acknowledged these results. Electronically Signed   By: Mitzi Hansen M.D.   On: 09/30/2016 14:46   Ct Cerebral Perfusion W Contrast  Result Date: 09/30/2016 CLINICAL DATA:  83 y/o  F; slurred speech and left-sided weakness. EXAM: CT ANGIOGRAPHY HEAD AND NECK CT PERFUSION BRAIN TECHNIQUE: Multidetector CT imaging of the head and neck was performed using the standard protocol during bolus administration of intravenous contrast. Multiplanar CT image reconstructions and MIPs were obtained to evaluate the vascular anatomy. Carotid stenosis measurements (when applicable) are obtained utilizing NASCET criteria, using the distal internal carotid diameter as the denominator. Multiphase CT imaging of the brain  was performed following IV bolus contrast injection. Subsequent parametric perfusion maps were calculated using RAPID software. CONTRAST:  Cta head and neck 50 cc Isovue 370. CT perfusion 40 cc Isovue 370. COMPARISON:  09/30/2016 CT head FINDINGS: CTA NECK FINDINGS Aortic arch: Standard branching. Imaged portion shows no evidence of aneurysm or dissection. No significant stenosis of the major arch vessel origins. Right carotid system: No evidence of dissection, stenosis (50% or greater) or occlusion. Left carotid system: No evidence of dissection, stenosis (50% or greater) or occlusion. Calcified plaque of the carotid bifurcation with minimal less than 30% stenosis of proximal ICA. Vertebral arteries: Left dominant. No evidence of dissection, stenosis (50% or greater) or occlusion. Skeleton: Grade 1 C4-5 anterolisthesis and prominent C4-5 facet arthropathy. No high-grade bony canal stenosis. Other neck: Negative. Upper chest: Negative. Review of the MIP images confirms the above findings CTA HEAD FINDINGS Anterior circulation: Moderate calcific atherosclerosis of the cavernous  and paraclinoid internal carotid artery is with short segment of mild-to-moderate left-sided paraclinoid stenosis. No significant stenosis, proximal vessel occlusion, or aneurysm. M2 inferior division short segment of mild ectasia measuring 3 mm (series 12, image 14). Posterior circulation: No significant stenosis, proximal occlusion, aneurysm, or vascular malformation. Venous sinuses: As permitted by contrast timing, patent. Anatomic variants: Right posterior communicating and anterior communicating artery is patent. No left posterior communicating artery identified, hypoplastic or absent. Review of the MIP images confirms the above findings CT Brain Perfusion Findings: Streak artifact from right-sided cochlear implant limits evaluation of right temporal lobe, brainstem, and cerebellum. CBF (<30%) Volume: 0mL Perfusion (Tmax>6.0s) volume: 0mL  Mismatch Volume: 0mL Infarction Location:NA. IMPRESSION: 1. Patent circle of Willis. No large vessel occlusion, aneurysm, or significant stenosis is identified. 2. Patent carotid and vertebral arteries. No dissection, aneurysm, or significant stenosis. 3. Streak artifact from right-sided cochlear implant limits evaluation of right temporal lobe, brainstem, and cerebellum on the CT perfusion study. In the visualized portions of the brain there is no perfusion abnormality. 4. Atherosclerosis with calcified plaque of left carotid bifurcation and the carotid siphons. Minimal ectasia of left inferior division M2 is also likely related to atherosclerotic disease. These results were called by telephone at the time of interpretation on 09/30/2016 at 2:38 pm to Dr. Rubin Payor, who verbally acknowledged these results. Electronically Signed   By: Mitzi Hansen M.D.   On: 09/30/2016 14:46   Ct Head Code Stroke W/o Cm  Result Date: 09/30/2016 CLINICAL DATA:  Code stroke.  Slurred speech and left-sided weakness EXAM: CT HEAD WITHOUT CONTRAST TECHNIQUE: Contiguous axial images were obtained from the base of the skull through the vertex without intravenous contrast. COMPARISON:  CT 08/06/2016 FINDINGS: Brain: Streak artifact from a right-sided cochlear implant obscures much of the posterior fossa and both temporal and occipital lobes.No mass lesion, intraparenchymal hemorrhage or extra-axial collection. No evidence of acute cortical infarct. There is mild for age periventricular hypoattenuation compatible with chronic microvascular disease. Vascular: No hyperdense vessel. Bilateral atherosclerotic calcification of the carotid arteries at skullbase. Skull: Normal visualized skull base, calvarium and extracranial soft tissues. Sinuses/Orbits: No sinus fluid levels or advanced mucosal thickening. No mastoid effusion. Normal orbits. ASPECTS [facility] Stroke Program Early CT Score) - Ganglionic level infarction (caudate,  lentiform nuclei, internal capsule, insula, M1-M3 cortex): 7 - Supraganglionic infarction (M4-M6 cortex): 3 Total score (0-10 with 10 being normal): 10 IMPRESSION: 1. No acute intracranial abnormality. Streak artifact from right-sided cochlear implant limits assessment of the posterior fossa and right temporal lobe. 2. ASPECTS is 10. These results were called by telephone at the time of interpretation on 09/30/2016 at 2:01 pm to Dr. Geoffery Lyons , who verbally acknowledged these results. Electronically Signed   By: Deatra Robinson M.D.   On: 09/30/2016 14:07    EKG: (Independently reviewed) sinus rhythm with ventricular rate 97 bpm, QTC 403 ms, no acute ischemic changes and unchanged from previous EKGs  Assessment/Plan Principal Problem:   Encephalopathy -Patient presents with acute onset of altered mental status as evidenced by slow to respond and confusion without any definitive focal neurological deficits-possible left upper extremity weakness -Appreciate neurology assistance-agree with EEG -Admitted with stroke rule out in April therefore will not repeat echo, carotid duplex or risk stratification labs -Apparently MRI not completed during previous admission 2/2 inability to clarify MRI compatibility of cochlear implant-this patient has a Med-El cochlear implant on the right that was placed at least 20 years ago so I have ordered MRI with this  information-if not compatible anticipate MRI will be canceled by radiology -PT/OT evaluation -Only new medication is nortriptyline which up until today patient appeared to be tolerating without side effects-uncertain if current symptoms related to nortriptyline and possible intolerance versus decreased clearance in setting of chronic kidney disease-will continue for now unless neurology recommends holding -Ck UA/cx  Active Problems:   Dehydration, mild -Patient presents with borderline hypokalemia, hyponatremia and elevated BUN consistent with likely  dehydration-potentially exacerbated by mild hyperglycemia -Gentle IV fluids at 75/hr -Family denies patient with poor oral intake, weight loss or nausea, vomiting diarrhea at home    Acute hyponatremia -Likely secondary to pseudohyponatremia from hyperglycemia -For completeness of exam check urine and serum osmolality and random urine sodium to rule out SIADH    Hypertension  -Currently controlled -Continue clonidine    Diabetes mellitus type 2, uncontrolled  -Continue preadmission Januvia with therapeutic substitution with Tradjenta -SSI -HgbA1c was 7.3 on 4/29    Bilateral leg pain -Improved after the addition of nortriptyline several weeks ago as recommended by OP neurologist -Also likely influenced by known low vitamin D level so we'll continue supplementation -Continue nortriptyline for now unless neurologist recommends discontinuation in setting of acute encephalopathy -Magnesium level    Hard of hearing -Has right cochlear implant as described above    CKD (chronic kidney disease), stage III -Except for elevated BUN and creatinine stable and at baseline      DVT prophylaxis: Lovenox  Code Status: Full Family Communication: Daughter  Disposition Plan: Home Consults called: Neurology/Kirkpatrick    Tawnie Ehresman L. ANP-BC [facility] Pager [phone]   If 7PM-7AM, please contact night-coverage [email or web address] Password [facility]  09/30/2016, 3:34 PM

## 2016-09-30 NOTE — Progress Notes (Signed)
STAT EEG completed; results pending. 

## 2016-09-30 NOTE — ED Provider Notes (Signed)
[facility]-EMERGENCY DEPT Provider Note   CSN: [id number] Arrival date & time: 09/30/16  1302     History   Chief Complaint Chief Complaint  Patient presents with  . Weakness  . Altered Mental Status   Level 5 caveat due to difficulty speaking HPI Jeanne Barker is a 83 y.o. female.  HPI Patient brought in for some mental status changes and the family speaking. Has reportedly been on and off altered for the last 3 weeks it had been normal yesterday or today. At around 11:00 today started having some confusion and difficulty speaking. Slow to answer. Had left-sided weakness on initial evaluation and code stroke called by Dr. Judd Lien. Upon my initial evaluation after CT scan patient has some improvement of her symptoms. She has also been seen by Neurology.   Past Medical History:  Diagnosis Date  . Anemia   . Chronic renal insufficiency   . Diabetes mellitus   . Hard of hearing   . Hyperlipidemia   . Hypertension   . Osteoarthritis   . Syphilis    ?latent?, treated c/ PCN 1993    Patient Active Problem List   Diagnosis Date Noted  . Leg pain 08/11/2016  . Hyponatremia 08/07/2016  . Acute encephalopathy 08/06/2016  . Abnormal urinalysis 08/06/2016  . Arthritis of right knee 05/25/2016  . PCP NOTES >>> 01/22/2015  . Acute UTI 06/19/2014  . Essential hypertension 06/19/2014  . Hyperlipidemia 06/19/2014  . Well adult on routine health check 06/17/2013  . OSTEOARTHRITIS 02/18/2008  . CKD (chronic kidney disease) stage 4, GFR 15-29 ml/min (HCC) 10/12/2006  . DMII (diabetes mellitus, type 2) (HCC) 08/17/2006  . Anemia 08/17/2006    Past Surgical History:  Procedure Laterality Date  . ABDOMINAL HYSTERECTOMY     BSO  . CATARACT EXTRACTION    . cochlear impalnt  1/02  . NEPHRECTOMY     left  . OOPHORECTOMY      OB History    No data available       Home Medications    Prior to Admission medications   Medication Sig Start Date End Date Taking? Authorizing Provider    acetaminophen (TYLENOL) 500 MG tablet Take 500 mg by mouth every 6 (six) hours as needed for mild pain.     [provider]  aspirin 81 MG tablet Take 81 mg by mouth daily.      [provider]  cholecalciferol (VITAMIN D) 1000 units tablet Take 1,000 Units by mouth daily.    [provider]  cloNIDine (CATAPRES) 0.1 MG tablet Take 1 tablet (0.1 mg total) by mouth 2 (two) times daily. 07/19/16   [name], MD  glucose blood (FREESTYLE LITE) test strip Check blood sugar no more than twice daily Patient not taking: Reported on 09/21/2016 08/29/16   [name], MD  nortriptyline (PAMELOR) 10 MG capsule Take 1 capsule (10 mg total) by mouth at bedtime. 09/12/16   Patel, Roxana Hires K, DO  RELION ULTRA THIN PLUS LANCETS MISC Check no more than twice daily Patient not taking: Reported on 09/21/2016 06/30/14   [name], MD  sitaGLIPtin (JANUVIA) 50 MG tablet Take 1 tablet (50 mg total) by mouth daily. 06/20/16   [name], MD  trolamine salicylate (ASPERCREME) 10 % cream Apply topically as needed. 12/06/12   Kelle Darting, NP  vitamin B-12 (CYANOCOBALAMIN) 100 MCG tablet Take 100 mcg by mouth daily.     [provider]  Vitamin D, Ergocalciferol, (  DRISDOL) 50000 units CAPS capsule Take 1 capsule (50,000 Units total) by mouth every 7 (seven) days. 08/29/16   [name], MD    Family History Family History  Problem Relation Age of Onset  . Other Brother   . Diabetes Mellitus II Brother   . CAD Neg Hx     Social History Social History  Substance Use Topics  . Smoking status: Former Games developer  . Smokeless tobacco: Never Used  . Alcohol use No     Allergies   Amlodipine besylate and Lisinopril   Review of Systems Review of Systems  Unable to perform ROS: Patient nonverbal     Physical Exam Updated Vital Signs Wt 58.7 kg (129 lb 6.6 oz)   BMI 25.27 kg/m   Physical Exam  Constitutional: She appears well-developed.  HENT:  Head: Atraumatic.  Eyes:  EOM are normal.  Eye movements now intact. Eyes we'll cross midline to the left which per neurology would not to earlier.  Cardiovascular: Normal rate.   Pulmonary/Chest: Effort normal.  Abdominal: There is no tenderness.  Musculoskeletal: She exhibits no edema.  Neurological: She is alert.  Somewhat slurred speech. Will follow commands. Will move all extremities. Eyes now we'll cross midline. Complete NIH scoring done by neurology.  Skin: Skin is warm. Capillary refill takes less than 2 seconds.     ED Treatments / Results  Labs (all labs ordered are listed, but only abnormal results are displayed) Labs Reviewed  CBC - Abnormal; Notable for the following:       Result Value   Hemoglobin 11.3 (*)    HCT 34.9 (*)    All other components within normal limits  COMPREHENSIVE METABOLIC PANEL - Abnormal; Notable for the following:    Sodium 130 (*)    Chloride 99 (*)    Glucose, Bld 221 (*)    BUN 25 (*)    Creatinine, Ser 1.50 (*)    Albumin 3.4 (*)    ALT 13 (*)    GFR calc non Af Amer 29 (*)    GFR calc Af Amer 34 (*)    All other components within normal limits  I-STAT CHEM 8, ED - Abnormal; Notable for the following:    Sodium 131 (*)    Potassium 5.2 (*)    Chloride 99 (*)    BUN 29 (*)    Creatinine, Ser 1.40 (*)    Glucose, Bld 223 (*)    All other components within normal limits  PROTIME-INR  APTT  DIFFERENTIAL  I-STAT TROPOININ, ED  CBG MONITORING, ED    EKG  EKG Interpretation  Date/Time:  Friday September 30 2016 14:36:40 EDT Ventricular Rate:  97 PR Interval:    QRS Duration: 86 QT Interval:  320 QTC Calculation: 403 R Axis:   -17 Text Interpretation:  Sinus rhythm Ventricular premature complex Aberrant conduction of SV complex(es) Borderline prolonged PR interval Borderline left axis deviation Low voltage, precordial leads Nonspecific T abnormalities, lateral leads Baseline wander in lead(s) II III aVL aVF V3 Confirmed by Rubin Payor  MD, Harrold Donath [phone]) on  09/30/2016 2:51:36 PM       Radiology Ct Angio Head W Or Wo Contrast  Result Date: 09/30/2016 CLINICAL DATA:  83 y/o  F; slurred speech and left-sided weakness. EXAM: CT ANGIOGRAPHY HEAD AND NECK CT PERFUSION BRAIN TECHNIQUE: Multidetector CT imaging of the head and neck was performed using the standard protocol during bolus administration of intravenous contrast. Multiplanar CT image reconstructions and MIPs were  obtained to evaluate the vascular anatomy. Carotid stenosis measurements (when applicable) are obtained utilizing NASCET criteria, using the distal internal carotid diameter as the denominator. Multiphase CT imaging of the brain was performed following IV bolus contrast injection. Subsequent parametric perfusion maps were calculated using RAPID software. CONTRAST:  Cta head and neck 50 cc Isovue 370. CT perfusion 40 cc Isovue 370. COMPARISON:  09/30/2016 CT head FINDINGS: CTA NECK FINDINGS Aortic arch: Standard branching. Imaged portion shows no evidence of aneurysm or dissection. No significant stenosis of the major arch vessel origins. Right carotid system: No evidence of dissection, stenosis (50% or greater) or occlusion. Left carotid system: No evidence of dissection, stenosis (50% or greater) or occlusion. Calcified plaque of the carotid bifurcation with minimal less than 30% stenosis of proximal ICA. Vertebral arteries: Left dominant. No evidence of dissection, stenosis (50% or greater) or occlusion. Skeleton: Grade 1 C4-5 anterolisthesis and prominent C4-5 facet arthropathy. No high-grade bony canal stenosis. Other neck: Negative. Upper chest: Negative. Review of the MIP images confirms the above findings CTA HEAD FINDINGS Anterior circulation: Moderate calcific atherosclerosis of the cavernous and paraclinoid internal carotid artery is with short segment of mild-to-moderate left-sided paraclinoid stenosis. No significant stenosis, proximal vessel occlusion, or aneurysm. M2 inferior division  short segment of mild ectasia measuring 3 mm (series 12, image 14). Posterior circulation: No significant stenosis, proximal occlusion, aneurysm, or vascular malformation. Venous sinuses: As permitted by contrast timing, patent. Anatomic variants: Right posterior communicating and anterior communicating artery is patent. No left posterior communicating artery identified, hypoplastic or absent. Review of the MIP images confirms the above findings CT Brain Perfusion Findings: Streak artifact from right-sided cochlear implant limits evaluation of right temporal lobe, brainstem, and cerebellum. CBF (<30%) Volume: 0mL Perfusion (Tmax>6.0s) volume: 0mL Mismatch Volume: 0mL Infarction Location:NA. IMPRESSION: 1. Patent circle of Willis. No large vessel occlusion, aneurysm, or significant stenosis is identified. 2. Patent carotid and vertebral arteries. No dissection, aneurysm, or significant stenosis. 3. Streak artifact from right-sided cochlear implant limits evaluation of right temporal lobe, brainstem, and cerebellum on the CT perfusion study. In the visualized portions of the brain there is no perfusion abnormality. 4. Atherosclerosis with calcified plaque of left carotid bifurcation and the carotid siphons. Minimal ectasia of left inferior division M2 is also likely related to atherosclerotic disease. These results were called by telephone at the time of interpretation on 09/30/2016 at 2:38 pm to Dr. Rubin PayorPickering, who verbally acknowledged these results. Electronically Signed   By: Mitzi HansenLance  Furusawa-Stratton M.D.   On: 09/30/2016 14:46   Ct Angio Neck W Or Wo Contrast  Result Date: 09/30/2016 CLINICAL DATA:  83 y/o  F; slurred speech and left-sided weakness. EXAM: CT ANGIOGRAPHY HEAD AND NECK CT PERFUSION BRAIN TECHNIQUE: Multidetector CT imaging of the head and neck was performed using the standard protocol during bolus administration of intravenous contrast. Multiplanar CT image reconstructions and MIPs were obtained  to evaluate the vascular anatomy. Carotid stenosis measurements (when applicable) are obtained utilizing NASCET criteria, using the distal internal carotid diameter as the denominator. Multiphase CT imaging of the brain was performed following IV bolus contrast injection. Subsequent parametric perfusion maps were calculated using RAPID software. CONTRAST:  Cta head and neck 50 cc Isovue 370. CT perfusion 40 cc Isovue 370. COMPARISON:  09/30/2016 CT head FINDINGS: CTA NECK FINDINGS Aortic arch: Standard branching. Imaged portion shows no evidence of aneurysm or dissection. No significant stenosis of the major arch vessel origins. Right carotid system: No evidence of dissection, stenosis (50%  or greater) or occlusion. Left carotid system: No evidence of dissection, stenosis (50% or greater) or occlusion. Calcified plaque of the carotid bifurcation with minimal less than 30% stenosis of proximal ICA. Vertebral arteries: Left dominant. No evidence of dissection, stenosis (50% or greater) or occlusion. Skeleton: Grade 1 C4-5 anterolisthesis and prominent C4-5 facet arthropathy. No high-grade bony canal stenosis. Other neck: Negative. Upper chest: Negative. Review of the MIP images confirms the above findings CTA HEAD FINDINGS Anterior circulation: Moderate calcific atherosclerosis of the cavernous and paraclinoid internal carotid artery is with short segment of mild-to-moderate left-sided paraclinoid stenosis. No significant stenosis, proximal vessel occlusion, or aneurysm. M2 inferior division short segment of mild ectasia measuring 3 mm (series 12, image 14). Posterior circulation: No significant stenosis, proximal occlusion, aneurysm, or vascular malformation. Venous sinuses: As permitted by contrast timing, patent. Anatomic variants: Right posterior communicating and anterior communicating artery is patent. No left posterior communicating artery identified, hypoplastic or absent. Review of the MIP images confirms the  above findings CT Brain Perfusion Findings: Streak artifact from right-sided cochlear implant limits evaluation of right temporal lobe, brainstem, and cerebellum. CBF (<30%) Volume: 0mL Perfusion (Tmax>6.0s) volume: 0mL Mismatch Volume: 0mL Infarction Location:NA. IMPRESSION: 1. Patent circle of Willis. No large vessel occlusion, aneurysm, or significant stenosis is identified. 2. Patent carotid and vertebral arteries. No dissection, aneurysm, or significant stenosis. 3. Streak artifact from right-sided cochlear implant limits evaluation of right temporal lobe, brainstem, and cerebellum on the CT perfusion study. In the visualized portions of the brain there is no perfusion abnormality. 4. Atherosclerosis with calcified plaque of left carotid bifurcation and the carotid siphons. Minimal ectasia of left inferior division M2 is also likely related to atherosclerotic disease. These results were called by telephone at the time of interpretation on 09/30/2016 at 2:38 pm to Dr. Rubin Payor, who verbally acknowledged these results. Electronically Signed   By: Mitzi Hansen M.D.   On: 09/30/2016 14:46   Ct Cerebral Perfusion W Contrast  Result Date: 09/30/2016 CLINICAL DATA:  83 y/o  F; slurred speech and left-sided weakness. EXAM: CT ANGIOGRAPHY HEAD AND NECK CT PERFUSION BRAIN TECHNIQUE: Multidetector CT imaging of the head and neck was performed using the standard protocol during bolus administration of intravenous contrast. Multiplanar CT image reconstructions and MIPs were obtained to evaluate the vascular anatomy. Carotid stenosis measurements (when applicable) are obtained utilizing NASCET criteria, using the distal internal carotid diameter as the denominator. Multiphase CT imaging of the brain was performed following IV bolus contrast injection. Subsequent parametric perfusion maps were calculated using RAPID software. CONTRAST:  Cta head and neck 50 cc Isovue 370. CT perfusion 40 cc Isovue 370.  COMPARISON:  09/30/2016 CT head FINDINGS: CTA NECK FINDINGS Aortic arch: Standard branching. Imaged portion shows no evidence of aneurysm or dissection. No significant stenosis of the major arch vessel origins. Right carotid system: No evidence of dissection, stenosis (50% or greater) or occlusion. Left carotid system: No evidence of dissection, stenosis (50% or greater) or occlusion. Calcified plaque of the carotid bifurcation with minimal less than 30% stenosis of proximal ICA. Vertebral arteries: Left dominant. No evidence of dissection, stenosis (50% or greater) or occlusion. Skeleton: Grade 1 C4-5 anterolisthesis and prominent C4-5 facet arthropathy. No high-grade bony canal stenosis. Other neck: Negative. Upper chest: Negative. Review of the MIP images confirms the above findings CTA HEAD FINDINGS Anterior circulation: Moderate calcific atherosclerosis of the cavernous and paraclinoid internal carotid artery is with short segment of mild-to-moderate left-sided paraclinoid stenosis. No significant stenosis, proximal  vessel occlusion, or aneurysm. M2 inferior division short segment of mild ectasia measuring 3 mm (series 12, image 14). Posterior circulation: No significant stenosis, proximal occlusion, aneurysm, or vascular malformation. Venous sinuses: As permitted by contrast timing, patent. Anatomic variants: Right posterior communicating and anterior communicating artery is patent. No left posterior communicating artery identified, hypoplastic or absent. Review of the MIP images confirms the above findings CT Brain Perfusion Findings: Streak artifact from right-sided cochlear implant limits evaluation of right temporal lobe, brainstem, and cerebellum. CBF (<30%) Volume: 0mL Perfusion (Tmax>6.0s) volume: 0mL Mismatch Volume: 0mL Infarction Location:NA. IMPRESSION: 1. Patent circle of Willis. No large vessel occlusion, aneurysm, or significant stenosis is identified. 2. Patent carotid and vertebral arteries. No  dissection, aneurysm, or significant stenosis. 3. Streak artifact from right-sided cochlear implant limits evaluation of right temporal lobe, brainstem, and cerebellum on the CT perfusion study. In the visualized portions of the brain there is no perfusion abnormality. 4. Atherosclerosis with calcified plaque of left carotid bifurcation and the carotid siphons. Minimal ectasia of left inferior division M2 is also likely related to atherosclerotic disease. These results were called by telephone at the time of interpretation on 09/30/2016 at 2:38 pm to Dr. Rubin Payor, who verbally acknowledged these results. Electronically Signed   By: Mitzi Hansen M.D.   On: 09/30/2016 14:46   Ct Head Code Stroke W/o Cm  Result Date: 09/30/2016 CLINICAL DATA:  Code stroke.  Slurred speech and left-sided weakness EXAM: CT HEAD WITHOUT CONTRAST TECHNIQUE: Contiguous axial images were obtained from the base of the skull through the vertex without intravenous contrast. COMPARISON:  CT 08/06/2016 FINDINGS: Brain: Streak artifact from a right-sided cochlear implant obscures much of the posterior fossa and both temporal and occipital lobes.No mass lesion, intraparenchymal hemorrhage or extra-axial collection. No evidence of acute cortical infarct. There is mild for age periventricular hypoattenuation compatible with chronic microvascular disease. Vascular: No hyperdense vessel. Bilateral atherosclerotic calcification of the carotid arteries at skullbase. Skull: Normal visualized skull base, calvarium and extracranial soft tissues. Sinuses/Orbits: No sinus fluid levels or advanced mucosal thickening. No mastoid effusion. Normal orbits. ASPECTS [facility] Stroke Program Early CT Score) - Ganglionic level infarction (caudate, lentiform nuclei, internal capsule, insula, M1-M3 cortex): 7 - Supraganglionic infarction (M4-M6 cortex): 3 Total score (0-10 with 10 being normal): 10 IMPRESSION: 1. No acute intracranial abnormality. Streak  artifact from right-sided cochlear implant limits assessment of the posterior fossa and right temporal lobe. 2. ASPECTS is 10. These results were called by telephone at the time of interpretation on 09/30/2016 at 2:01 pm to Dr. Geoffery Lyons , who verbally acknowledged these results. Electronically Signed   By: Deatra Robinson M.D.   On: 09/30/2016 14:07    Procedures Procedures (including critical care time)  Medications Ordered in ED Medications  iopamidol (ISOVUE-370) 76 % injection (50 mLs  Contrast Given 09/30/16 1400)     Initial Impression / Assessment and Plan / ED Course  I have reviewed the triage vital signs and the nursing notes.  Pertinent labs & imaging results that were available during my care of the patient were reviewed by me and considered in my medical decision making (see chart for details).     Patient with difficulty speaking and difficulty moving left side. Some of it has improved. Code stroke having called. Seen by neurology and has had CT and CT perfusion. No acute stroke seen on that. No large vessel disease. Neurology is seen patient and request a stat EEG. Not a TPA candidate due to  some improvement of symptoms and questionable time of onset. Will admit to internal medicine.  Final Clinical Impressions(s) / ED Diagnoses   Final diagnoses:  Difficulty speaking  Renal insufficiency    New Prescriptions New Prescriptions   No medications on file     Benjiman Core, MD 09/30/16 1453

## 2016-09-30 NOTE — ED Notes (Signed)
EEG at bedside.

## 2016-09-30 NOTE — Code Documentation (Signed)
83[facility] at [facility].  Patient from home where she was noted by family to have delayed speech and generalized weakness this morning at 0800.  Later at 1100 patient developed incomprehensible speech.  Patient presented to the ED and was noted to be leaning to the left, code stroke activated.  Patient to CT.  Stroke team to the bedside.  Patient leaning to the left in wheelchair and noted to have right gaze preference and left hemianopia.  BP 199/77.  CT completed.  PIV started.  CTP and CTA imaging completed.  Patient back to Trauma A.  NIHSS 1, see documentation for details and code stroke times.  Patient unable to state the month on exam.  Code stroke canceled.  Patient to have EEG completed.  Bedside handoff with ED RN Chestine Sporelark.

## 2016-09-30 NOTE — Procedures (Signed)
History: 83 year old female with left-sided neglect and right gaze  Sedation: None  Technique: This is a 21 channel routine scalp EEG performed at the bedside with bipolar and monopolar montages arranged in accordance to the international 10/20 system of electrode placement. One channel was dedicated to EKG recording.    Background: There is a posterior dominant rhythm of 8 Hz which is seen better on the left than right. In addition to the attenuation of the posterior dominant rhythm, there is mild focal delta at T8, P8, O2.  Photic stimulation: Physiologic driving is not performed  EEG Abnormalities: 1) attenuation of the posterior dominant rhythm on the right 2) irregular delta activity in the right posterior quadrant  Clinical Interpretation: This EEG is consistent with an area of focal cerebral dysfunction in the right posterior quadrant. This can be seen after ischemic infarct, postictal state, or other causes of focal cerebral dysfunction.There was no seizure or seizure predisposition recorded on this study. Please note that a normal EEG does not preclude the possibility of epilepsy.   Jeanne SlotMcNeill Grisell Bissette, MD [facility] [phone]  If 7pm- 7am, please page neurology on call as listed in AMION.

## 2016-09-30 NOTE — Progress Notes (Signed)
Pt admitted to 5M18 at this time.  Daughters at bedside.  Pt is alert but unable to answer questions at this time due to hearing aid batteries going dead.  Family will bring in.  She is situated in bed, bed alarm set, call bell within reach.  Explained use of bed alarm to family.

## 2016-10-01 ENCOUNTER — Encounter (HOSPITAL_COMMUNITY): Payer: Self-pay

## 2016-10-01 ENCOUNTER — Other Ambulatory Visit: Payer: Self-pay | Admitting: Internal Medicine

## 2016-10-01 DIAGNOSIS — E871 Hypo-osmolality and hyponatremia: Secondary | ICD-10-CM

## 2016-10-01 DIAGNOSIS — M79605 Pain in left leg: Secondary | ICD-10-CM

## 2016-10-01 DIAGNOSIS — G9341 Metabolic encephalopathy: Secondary | ICD-10-CM | POA: Diagnosis present

## 2016-10-01 DIAGNOSIS — M79604 Pain in right leg: Secondary | ICD-10-CM

## 2016-10-01 DIAGNOSIS — E860 Dehydration: Secondary | ICD-10-CM | POA: Diagnosis present

## 2016-10-01 DIAGNOSIS — H9193 Unspecified hearing loss, bilateral: Secondary | ICD-10-CM | POA: Diagnosis not present

## 2016-10-01 DIAGNOSIS — G9340 Encephalopathy, unspecified: Secondary | ICD-10-CM | POA: Diagnosis not present

## 2016-10-01 DIAGNOSIS — E1165 Type 2 diabetes mellitus with hyperglycemia: Secondary | ICD-10-CM | POA: Diagnosis present

## 2016-10-01 DIAGNOSIS — Z79899 Other long term (current) drug therapy: Secondary | ICD-10-CM | POA: Diagnosis not present

## 2016-10-01 DIAGNOSIS — N390 Urinary tract infection, site not specified: Secondary | ICD-10-CM | POA: Diagnosis present

## 2016-10-01 DIAGNOSIS — Z87891 Personal history of nicotine dependence: Secondary | ICD-10-CM | POA: Diagnosis not present

## 2016-10-01 DIAGNOSIS — Z905 Acquired absence of kidney: Secondary | ICD-10-CM | POA: Diagnosis not present

## 2016-10-01 DIAGNOSIS — Z7982 Long term (current) use of aspirin: Secondary | ICD-10-CM | POA: Diagnosis not present

## 2016-10-01 DIAGNOSIS — E875 Hyperkalemia: Secondary | ICD-10-CM | POA: Diagnosis present

## 2016-10-01 DIAGNOSIS — R4182 Altered mental status, unspecified: Secondary | ICD-10-CM | POA: Diagnosis not present

## 2016-10-01 DIAGNOSIS — H9190 Unspecified hearing loss, unspecified ear: Secondary | ICD-10-CM | POA: Diagnosis present

## 2016-10-01 DIAGNOSIS — N183 Chronic kidney disease, stage 3 (moderate): Secondary | ICD-10-CM | POA: Diagnosis present

## 2016-10-01 DIAGNOSIS — Z888 Allergy status to other drugs, medicaments and biological substances status: Secondary | ICD-10-CM | POA: Diagnosis not present

## 2016-10-01 LAB — BASIC METABOLIC PANEL
ANION GAP: 7 (ref 5–15)
BUN: 23 mg/dL — ABNORMAL HIGH (ref 6–20)
CO2: 21 mmol/L — ABNORMAL LOW (ref 22–32)
Calcium: 9.1 mg/dL (ref 8.9–10.3)
Chloride: 103 mmol/L (ref 101–111)
Creatinine, Ser: 1.54 mg/dL — ABNORMAL HIGH (ref 0.44–1.00)
GFR calc Af Amer: 33 mL/min — ABNORMAL LOW (ref >60)
GFR, EST NON AFRICAN AMERICAN: 28 mL/min — AB (ref >60)
Glucose, Bld: 84 mg/dL (ref 65–99)
Potassium: 4.1 mmol/L (ref 3.5–5.1)
SODIUM: 131 mmol/L — AB (ref 135–145)

## 2016-10-01 LAB — OSMOLALITY, URINE: OSMOLALITY UR: 496 mosm/kg (ref 300–900)

## 2016-10-01 LAB — URINALYSIS, COMPLETE (UACMP) WITH MICROSCOPIC
BILIRUBIN URINE: NEGATIVE
GLUCOSE, UA: NEGATIVE mg/dL
Ketones, ur: NEGATIVE mg/dL
Nitrite: NEGATIVE
PH: 8 (ref 5.0–8.0)
Protein, ur: 100 mg/dL — AB
SPECIFIC GRAVITY, URINE: 1.027 (ref 1.005–1.030)

## 2016-10-01 LAB — AMMONIA: AMMONIA: 37 umol/L — AB (ref 9–35)

## 2016-10-01 LAB — CBC
HCT: 31.2 % — ABNORMAL LOW (ref 36.0–46.0)
Hemoglobin: 9.9 g/dL — ABNORMAL LOW (ref 12.0–15.0)
MCH: 25.9 pg — AB (ref 26.0–34.0)
MCHC: 31.7 g/dL (ref 30.0–36.0)
MCV: 81.7 fL (ref 78.0–100.0)
PLATELETS: 279 10*3/uL (ref 150–400)
RBC: 3.82 MIL/uL — AB (ref 3.87–5.11)
RDW: 13.1 % (ref 11.5–15.5)
WBC: 6.9 10*3/uL (ref 4.0–10.5)

## 2016-10-01 LAB — GLUCOSE, CAPILLARY
GLUCOSE-CAPILLARY: 127 mg/dL — AB (ref 65–99)
GLUCOSE-CAPILLARY: 89 mg/dL (ref 65–99)
GLUCOSE-CAPILLARY: 185 mg/dL — AB (ref 65–99)
Glucose-Capillary: 126 mg/dL — ABNORMAL HIGH (ref 65–99)
Glucose-Capillary: 135 mg/dL — ABNORMAL HIGH (ref 65–99)
Glucose-Capillary: 228 mg/dL — ABNORMAL HIGH (ref 65–99)

## 2016-10-01 LAB — VITAMIN B12: Vitamin B-12: 4918 pg/mL — ABNORMAL HIGH (ref 180–914)

## 2016-10-01 LAB — SODIUM, URINE, RANDOM: Sodium, Ur: 68 mmol/L

## 2016-10-01 MED ORDER — DEXTROSE 5 % IV SOLN
1.0000 g | INTRAVENOUS | Status: DC
  Administered 2016-10-01: 1 g via INTRAVENOUS
  Filled 2016-10-01 (×2): qty 10

## 2016-10-01 NOTE — Evaluation (Signed)
Clinical/Bedside Swallow Evaluation Patient Details  Name: YENG FRANKIE MRN: [id number] Date of Birth: 03-13-1924  Today's Date: 10/01/2016 Time: SLP Start Time (ACUTE ONLY): 0845 SLP Stop Time (ACUTE ONLY): 0910 SLP Time Calculation (min) (ACUTE ONLY): 25 min  Past Medical History:  Past Medical History:  Diagnosis Date  . Anemia   . Chronic renal insufficiency   . Diabetes mellitus   . Hard of hearing   . Hyperlipidemia   . Hypertension   . Osteoarthritis   . Syphilis    ?latent?, treated c/ PCN 1993   Past Surgical History:  Past Surgical History:  Procedure Laterality Date  . ABDOMINAL HYSTERECTOMY     BSO  . CATARACT EXTRACTION    . cochlear impalnt  1/02  . NEPHRECTOMY     left  . OOPHORECTOMY     HPI:  Patient is a 83 y.o. female who lives in her own home but always has one of her daughters staying in the house with her. Daughter noticed she was acting weaker in the 3 weeks prior, and day of admission, exhibited significant decreased verbal output, left sided weakness and leaning to the left. CTA head was negative.   Assessment / Plan / Recommendation Clinical Impression  Patient presents with mild oropharyngeal dysphagia but with no overt s/s of aspiration or penetration. Dysphagia characterized by mild oral transit delay with puree solids, poor oral preparatory phrase with cup of thin liquids, but did better with straw sips, and suspected swallow initiation delay. Per patient's daughter, she has been on a pureed solids diet at home for the past 3 weeks and prior to that, foods were all "very soft".  SLP Visit Diagnosis: Dysphagia, oropharyngeal phase (R13.12)    Aspiration Risk  Mild aspiration risk    Diet Recommendation Dysphagia 1 (Puree);Thin liquid   Liquid Administration via: Cup;Straw Medication Administration: Whole meds with liquid Supervision: Patient able to self feed;Full supervision/cueing for compensatory strategies Compensations: Minimize  environmental distractions;Slow rate;Small sips/bites Postural Changes: Seated upright at 90 degrees    Other  Recommendations Oral Care Recommendations: Oral care BID   Follow up Recommendations None      Frequency and Duration min 1 x/week  1 week (one diet ck before discharge)       Prognosis Prognosis for Safe Diet Advancement: Good (patient is likely on least restrictive diet as per daughter's report)      Swallow Study   General Date of Onset: 09/30/16 HPI: Patient is a 83 y.o. female who lives in her own home but always has one of her daughters staying in the house with her. Daughter noticed she was acting weaker in the 3 weeks prior, and day of admission, exhibited significant decreased verbal output, left sided weakness and leaning to the left. CTA head was negative. Type of Study: Bedside Swallow Evaluation Previous Swallow Assessment: N/A Diet Prior to this Study: NPO Temperature Spikes Noted: No Respiratory Status: Room air History of Recent Intubation: No Behavior/Cognition: Alert;Cooperative;Pleasant mood Oral Cavity Assessment: Within Functional Limits Oral Care Completed by SLP: Yes Oral Cavity - Dentition: Edentulous Vision: Functional for self-feeding Self-Feeding Abilities: Needs assist;Needs set up Patient Positioning: Upright in bed Baseline Vocal Quality: Low vocal intensity Volitional Cough: Strong Volitional Swallow: Able to elicit    Oral/Motor/Sensory Function Overall Oral Motor/Sensory Function: Within functional limits   Ice Chips Ice chips: Not tested   Thin Liquid Thin Liquid: Impaired Presentation: Straw;Cup Oral Phase Impairments: Reduced labial seal Pharyngeal  Phase Impairments: Suspected delayed  Swallow Other Comments: No overt s/s of aspiration or penetration.    Nectar Thick Nectar Thick Liquid: Not tested   Honey Thick Honey Thick Liquid: Not tested   Puree Puree: Impaired Presentation: Spoon Oral Phase Functional Implications:  Prolonged oral transit Other Comments: mild delay oral transit, improved with successive bites   Solid   GO   Solid: Not tested Other Comments: Per patient's daughter, she has been on a puree diet at home prior to this admission    Functional Assessment Tool Used: Beside swallow evaluation, clinical judgement Functional Limitations: Swallowing Swallow Current Status (Z6109(G8996): At least 20 percent but less than 40 percent impaired, limited or restricted Swallow Goal Status [phone](G8997): At least 1 percent but less than 20 percent impaired, limited or restricted     Angela NevinJohn T. Marrie Chandra, MA, CCC-SLP 10/01/16 12:40 PM

## 2016-10-01 NOTE — Evaluation (Signed)
Physical Therapy Evaluation Patient Details Name: Jeanne Barker MRN: [id number] DOB: 07/23/1923 Today's Date: 10/01/2016   History of Present Illness  83 y.o. female with medical history significant for hypertension, diabetes 2 on oral agents, hard of hearing with cochlear implant, osteoarthritis, stage III chronic kidney disease, syphilis, and history of bilateral leg pain. She was admitted with dx of encephalopathy.    Clinical Impression  Pt admitted with above diagnosis. Pt currently with functional limitations due to the deficits listed below (see PT Problem List). On eval, pt required min assist sit to stand and min assist gait with rollator 65 feet.  Pt will benefit from skilled PT to increase their independence and safety with mobility to allow discharge to the venue listed below.       Follow Up Recommendations Home health PT;Supervision/Assistance - 24 hour    Equipment Recommendations  None recommended by PT    Recommendations for Other Services       Precautions / Restrictions Precautions Precautions: Fall      Mobility  Bed Mobility               General bed mobility comments: Pt in recliner on arrival.  Transfers Overall transfer level: Needs assistance Equipment used: 4-wheeled walker   Sit to Stand: Min assist         General transfer comment: verbal/tactile cues for hand placement. Assist to power up.  Ambulation/Gait Ambulation/Gait assistance: Min assist Ambulation Distance (Feet): 65 Feet Assistive device: 4-wheeled walker Gait Pattern/deviations: Step-through pattern;Trunk flexed;Decreased stride length;Drifts right/left Gait velocity: decreased   General Gait Details: Assist to manage rollator and stabilize balance.  Stairs            Wheelchair Mobility    Modified Rankin (Stroke Patients Only)       Balance Overall balance assessment: Needs assistance Sitting-balance support: No upper extremity supported;Feet  supported Sitting balance-Leahy Scale: Good     Standing balance support: During functional activity;Bilateral upper extremity supported Standing balance-Leahy Scale: Poor                               Pertinent Vitals/Pain Pain Assessment: No/denies pain    Home Living Family/patient expects to be discharged to:: Private residence Living Arrangements: Children Available Help at Discharge: Family;Available 24 hours/day Type of Home: House Home Access: Level entry     Home Layout: One level Home Equipment: Walker - 2 wheels;Cane - single point;Shower seat;Grab bars - tub/shower;Wheelchair - manual;Bedside commode;Walker - 4 wheels      Prior Function Level of Independence: Independent with assistive device(s)         Comments: uses rollator     Hand Dominance        Extremity/Trunk Assessment   Upper Extremity Assessment Upper Extremity Assessment: Defer to OT evaluation    Lower Extremity Assessment Lower Extremity Assessment: Generalized weakness    Cervical / Trunk Assessment Cervical / Trunk Assessment: Kyphotic  Communication   Communication: HOH  Cognition Arousal/Alertness: Awake/alert Behavior During Therapy: WFL for tasks assessed/performed Overall Cognitive Status: Difficult to assess                                        General Comments      Exercises     Assessment/Plan    PT Assessment Patient needs continued PT services  PT Problem List Decreased strength;Decreased activity tolerance;Decreased balance;Decreased mobility;Decreased knowledge of use of DME;Decreased safety awareness;Decreased knowledge of precautions       PT Treatment Interventions DME instruction;Gait training;Functional mobility training;Therapeutic activities;Therapeutic exercise;Balance training;Patient/family education    PT Goals (Current goals can be found in the Care Plan section)  Acute Rehab PT Goals Patient Stated Goal: home,  per daughters PT Goal Formulation: With patient/family Time For Goal Achievement: 10/15/16 Potential to Achieve Goals: Fair    Frequency Min 3X/week   Barriers to discharge        Co-evaluation               AM-PAC PT "6 Clicks" Daily Activity  Outcome Measure Difficulty turning over in bed (including adjusting bedclothes, sheets and blankets)?: None Difficulty moving from lying on back to sitting on the side of the bed? : Total Difficulty sitting down on and standing up from a chair with arms (e.g., wheelchair, bedside commode, etc,.)?: Total Help needed moving to and from a bed to chair (including a wheelchair)?: A Little Help needed walking in hospital room?: A Little Help needed climbing 3-5 steps with a railing? : A Lot 6 Click Score: 14    End of Session Equipment Utilized During Treatment: Gait belt Activity Tolerance: Patient tolerated treatment well Patient left: in chair;with call bell/phone within reach;with family/visitor present Nurse Communication: Mobility status PT Visit Diagnosis: Unsteadiness on feet (R26.81);Muscle weakness (generalized) (M62.81)    Time: 1610-9604 PT Time Calculation (min) (ACUTE ONLY): 15 min   Charges:   PT Evaluation $PT Eval Moderate Complexity: 1 Procedure     PT G Codes:   PT G-Codes **NOT FOR INPATIENT CLASS** Functional Assessment Tool Used: AM-PAC 6 Clicks Basic Mobility Functional Limitation: Mobility: Walking and moving around Mobility: Walking and Moving Around Current Status (V4098): At least 40 percent but less than 60 percent impaired, limited or restricted Mobility: Walking and Moving Around Goal Status [phone]): At least 20 percent but less than 40 percent impaired, limited or restricted    Aida Raider, PT  Office # [phone] Pager [phone]   Ilda Foil 10/01/2016, 1:25 PM

## 2016-10-01 NOTE — Progress Notes (Signed)
Patient's daughter provided RN with paperwork that company for cochlear implant sent her concerning cochlear implant and MRI. RN placed paperwork on hard chart to be reviewed by MD and sent a text page to Dr Isidoro Donningai alerting her that paperwork was on chart.

## 2016-10-01 NOTE — Evaluation (Signed)
Occupational Therapy Evaluation Patient Details Name: Jeanne Barker MRN: [id number] DOB: 1923-06-07 Today's Date: 10/01/2016    History of Present Illness 83 y.o. female with medical history significant for hypertension, diabetes 2 on oral agents, hard of hearing with cochlear implant, osteoarthritis, stage III chronic kidney disease, syphilis, and history of bilateral leg pain. CT negative for acute infarct. She was admitted with dx of encephalopathy.     Clinical Impression   Pt familiar to this OT from previous admissions. Prior to current admission, pt had assistance from her daughters for dressing and bathing tasks and was ambulating with a rollator. She currently requires min assist for self-feeding and UB ADL and mod assist for LB ADL as well as min assist +2 for toilet transfers. Pt presents with generalized UE weakness (L>R), decreased cognition, and decreased activity tolerance for ADL as detailed below. She would benefit from continued OT services while admitted to improve independence with ADL and functional mobility. Recommend home health OT services for OT follow-up. Her daughters report that they will be able to provide 24 hour assistance. Will continue to follow while admitted.     Follow Up Recommendations  Home health OT;Supervision/Assistance - 24 hour    Equipment Recommendations  None recommended by OT    Recommendations for Other Services       Precautions / Restrictions Precautions Precautions: Fall Restrictions Weight Bearing Restrictions: No      Mobility Bed Mobility Overal bed mobility: Needs Assistance Bed Mobility: Supine to Sit     Supine to sit: Mod assist;HOB elevated     General bed mobility comments: Mod assist to manage B LE's and raise trunk from bed.   Transfers Overall transfer level: Needs assistance Equipment used: Rolling walker (2 wheeled)   Sit to Stand: Min assist;+2 safety/equipment         General transfer comment: Min  lifting and steadying assist.     Balance Overall balance assessment: Needs assistance Sitting-balance support: No upper extremity supported;Feet supported Sitting balance-Leahy Scale: Fair     Standing balance support: During functional activity;Bilateral upper extremity supported Standing balance-Leahy Scale: Poor Standing balance comment: Reliant on B UE support.                            ADL either performed or assessed with clinical judgement   ADL Overall ADL's : Needs assistance/impaired Eating/Feeding: Minimal assistance;Sitting   Grooming: Minimal assistance;Sitting   Upper Body Bathing: Moderate assistance;Sitting   Lower Body Bathing: Moderate assistance;Sit to/from stand   Upper Body Dressing : Moderate assistance;Sitting   Lower Body Dressing: Moderate assistance;Sit to/from stand   Toilet Transfer: Minimal assistance;+2 for safety/equipment;BSC;RW Toilet Transfer Details (indicate cue type and reason): Taking a few shuffling steps. Toileting- Clothing Manipulation and Hygiene: Maximal assistance;Sit to/from stand       Functional mobility during ADLs: Minimal assistance;Rolling walker General ADL Comments: Pt with slow processing and generalized weakness impacting independence.      Vision Baseline Vision/History: Wears glasses Wears Glasses: At all times Patient Visual Report: No change from baseline Additional Comments: No apparent visual deficits. Pt with decreased ability to follow commands appropriately. Will continue to assess.     Perception     Praxis      Pertinent Vitals/Pain Pain Assessment: No/denies pain     Hand Dominance     Extremity/Trunk Assessment Upper Extremity Assessment Upper Extremity Assessment: LUE deficits/detail;Generalized weakness (4/5 R UE; 3+/5 L UE) LUE  Deficits / Details: Slightly decreased strength as compared to R with 3+/5 strength.   Lower Extremity Assessment Lower Extremity Assessment:  Generalized weakness   Cervical / Trunk Assessment Cervical / Trunk Assessment: Kyphotic   Communication Communication Communication: HOH   Cognition Arousal/Alertness: Awake/alert Behavior During Therapy: WFL for tasks assessed/performed Overall Cognitive Status: Difficult to assess Area of Impairment: Attention;Following commands;Safety/judgement;Awareness;Problem solving                   Current Attention Level: Selective   Following Commands: Follows one step commands inconsistently Safety/Judgement: Decreased awareness of safety;Decreased awareness of deficits Awareness: Emergent Problem Solving: Slow processing General Comments: Pt with hyperfocus on her pulse ox this session. Increased time for responding and decreased ability to maintain attention to task. During previous admissions, pt has been talkative but very quiet this session.    General Comments       Exercises     Shoulder Instructions      Home Living Family/patient expects to be discharged to:: Private residence Living Arrangements: Children Available Help at Discharge: Family;Available 24 hours/day Type of Home: House Home Access: Level entry     Home Layout: One level     Bathroom Shower/Tub: Producer, television/film/video: Handicapped height Bathroom Accessibility: Yes   Home Equipment: Environmental consultant - 2 wheels;Cane - single point;Shower seat;Grab bars - tub/shower;Wheelchair - manual;Bedside commode;Walker - 4 wheels          Prior Functioning/Environment Level of Independence: Needs assistance  Gait / Transfers Assistance Needed: Uses rollator ADL's / Homemaking Assistance Needed: Assistance for bathing with pt able to participate (daughter reports assisting with transfer and "soaping"). Assist for dressing tasks at times.  Daughters rotate providing assistance.    Comments: uses rollator        OT Problem List: Decreased strength;Decreased activity tolerance;Impaired balance  (sitting and/or standing);Decreased safety awareness;Decreased knowledge of use of DME or AE;Decreased knowledge of precautions;Pain;Impaired UE functional use      OT Treatment/Interventions: Self-care/ADL training;Therapeutic exercise;Energy conservation;DME and/or AE instruction;Therapeutic activities;Patient/family education;Balance training;Cognitive remediation/compensation;Visual/perceptual remediation/compensation    OT Goals(Current goals can be found in the care plan section) Acute Rehab OT Goals Patient Stated Goal: home, per daughters OT Goal Formulation: With patient/family Time For Goal Achievement: 10/15/16 Potential to Achieve Goals: Good ADL Goals Pt Will Perform Eating: with supervision;sitting Pt Will Perform Grooming: standing;with supervision (3 tasks) Pt Will Perform Upper Body Dressing: with supervision;sitting Pt Will Perform Lower Body Dressing: with supervision;sit to/from stand Pt Will Transfer to Toilet: with supervision;ambulating;bedside commode (BSC over toilet) Pt Will Perform Toileting - Clothing Manipulation and hygiene: with supervision;sitting/lateral leans Pt/caregiver will Perform Home Exercise Program: Increased strength;Both right and left upper extremity;With written HEP provided;With Supervision Additional ADL Goal #1: Pt will demonstrate anticipatory awareness during morning ADL routine in a minimally distracting environment.   OT Frequency: Min 2X/week   Barriers to D/C:            Co-evaluation              AM-PAC PT "6 Clicks" Daily Activity     Outcome Measure Help from another person eating meals?: A Little Help from another person taking care of personal grooming?: A Little Help from another person toileting, which includes using toliet, bedpan, or urinal?: A Lot Help from another person bathing (including washing, rinsing, drying)?: A Lot Help from another person to put on and taking off regular upper body clothing?: A Lot Help  from another person  to put on and taking off regular lower body clothing?: A Lot 6 Click Score: 14   End of Session Equipment Utilized During Treatment: Gait belt;Rolling walker  Activity Tolerance: Patient tolerated treatment well Patient left: in chair;with call bell/phone within reach;with family/visitor present  OT Visit Diagnosis: Unsteadiness on feet (R26.81);Muscle weakness (generalized) (M62.81)                Time: 6962-95281124-1200 OT Time Calculation (min): 36 min Charges:  OT General Charges $OT Visit: 1 Procedure OT Evaluation $OT Eval Moderate Complexity: 1 Procedure OT Treatments $Self Care/Home Management : 8-22 mins G-Codes: OT G-codes **NOT FOR INPATIENT CLASS** Functional Assessment Tool Used: AM-PAC 6 Clicks Daily Activity Functional Limitation: Self care Self Care Current Status (U1324(G8987): At least 40 percent but less than 60 percent impaired, limited or restricted Self Care Goal Status (M0102(G8988): At least 1 percent but less than 20 percent impaired, limited or restricted   Doristine Sectionharity A Elberta Lachapelle, MS OTR/L  Pager: [phone]   Cynia Abruzzo A Dilynn Munroe 10/01/2016, 5:40 PM

## 2016-10-01 NOTE — Progress Notes (Addendum)
STROKE TEAM PROGRESS NOTE   HISTORY OF PRESENT ILLNESS (per record) Jeanne Barker is an 83 y.o. female who per daughter has been acting weaker than usual over the last 3 weeks. This AM she noted she was able to eat her breakfast but very slow in responses to answer questions. At 11 AM she noted significant decrease in verbal output, left sided weakness and leaning to the left. Patient was brought to [facility] by personal car. In triage Code stroke was called. She was not a tPA candidate due to unclear LSN. Thus a CT perfusion and CTA head and neck were done. These were also negative thus a EEG was also ordered.   Date last known well: Unable to determine Time last known well: Unable to determine tPA Given: No: no LSN    SUBJECTIVE (INTERVAL HISTORY) Her two daughters are at the bedside.  They stated that patient seems to be lethargic and generalized weakness for about a several days to a week PTA. Yesterday had difficulty with speech, slowed response questionable for left neglect and left hemianopia. Code stroke called, CTA head and neck negative, symptoms much improved after CT perfusion. Today during around in, sitting in chair, more awake alert orientated, no focal neurological deficit. Has cochlear implant, MRI brain pending.   OBJECTIVE Temp:  [97.8 F (36.6 C)-99.4 F (37.4 C)] 98.6 F (37 C) (06/23 1458) Pulse Rate:  [81-104] 81 (06/23 1458) Cardiac Rhythm: Normal sinus rhythm (06/23 0800) Resp:  [18-22] 18 (06/23 1458) BP: (123-187)/(52-103) 123/52 (06/23 1458) SpO2:  [98 %-100 %] 100 % (06/23 1458)  CBC:   Recent Labs Lab 09/30/16 1331 09/30/16 1344 10/01/16 0706  WBC 6.9  --  6.9  NEUTROABS 5.0  --   --   HGB 11.3* 12.2 9.9*  HCT 34.9* 36.0 31.2*  MCV 83.1  --  81.7  PLT 313  --  279    Basic Metabolic Panel:   Recent Labs Lab 09/30/16 1331 09/30/16 1344 09/30/16 1937 10/01/16 0706  NA 130* 131*  --  131*  K 5.1 5.2*  --  4.1  CL 99* 99*  --  103  CO2 24  --    --  21*  GLUCOSE 221* 223*  --  84  BUN 25* 29*  --  23*  CREATININE 1.50* 1.40*  --  1.54*  CALCIUM 9.6  --   --  9.1  MG  --   --  1.7  --     Lipid Panel:     Component Value Date/Time   CHOL 144 08/07/2016 0138   TRIG 59 08/07/2016 0138   HDL 80 08/07/2016 0138   CHOLHDL 1.8 08/07/2016 0138   VLDL 12 08/07/2016 0138   LDLCALC 52 08/07/2016 0138   HgbA1c:  Lab Results  Component Value Date   HGBA1C 7.3 (H) 08/07/2016   Urine Drug Screen: No results found for: LABOPIA, COCAINSCRNUR, LABBENZ, AMPHETMU, THCU, LABBARB  Alcohol Level No results found for: ETH  IMAGING I have personally reviewed the radiological images below and agree with the radiology interpretations.  Ct Angio Head W Or Wo Contrast Ct Angio Neck W Or Wo Contrast Ct Cerebral Perfusion W Contrast 09/30/2016 1. Patent circle of Willis. No large vessel occlusion, aneurysm, or significant stenosis is identified.  2. Patent carotid and vertebral arteries. No dissection, aneurysm, or significant stenosis.  3. Streak artifact from right-sided cochlear implant limits evaluation of right temporal lobe, brainstem, and cerebellum on the CT perfusion study. In  the visualized portions of the brain there is no perfusion abnormality.  4. Atherosclerosis with calcified plaque of left carotid bifurcation and the carotid siphons. Minimal ectasia of left inferior division M2 is also likely related to atherosclerotic disease.   Dg Chest Port 1 View 09/30/2016 Minimal bibasilar atelectasis.   Ct Head Code Stroke W/o Cm 09/30/2016 1. No acute intracranial abnormality. Streak artifact from right-sided cochlear implant limits assessment of the posterior fossa and right temporal lobe.  2. ASPECTS is 10.   Transthoracic Echocardiogram 08/07/2016 Study Conclusions - Left ventricle: The cavity size was normal. Wall thickness was   increased in a pattern of mild LVH. Systolic function was normal.   The estimated ejection fraction  was in the range of 60% to 65%.   Wall motion was normal; there were no regional wall motion   abnormalities. Doppler parameters are consistent with both   elevated ventricular end-diastolic filling pressure and elevated   left atrial filling pressure. - Atrial septum: No defect or patent foramen ovale was identified.  EEG 09/30/2016 1) attenuation of the posterior dominant rhythm on the right 2) irregular delta activity in the right posterior quadrant Clinical Interpretation:  This EEG is consistent with an area of focal cerebral dysfunction in the right posterior quadrant. This can be seen after ischemic infarct, postictal state, or other causes of focal cerebral dysfunction.There was no seizure or seizure predisposition recorded on this study. Please note that a normal EEG does not preclude the possibility of epilepsy.   MRI pending   PHYSICAL EXAM  Temp:  [97.5 F (36.4 C)-99.4 F (37.4 C)] 97.5 F (36.4 C) (06/23 1523) Pulse Rate:  [69-104] 69 (06/23 1523) Resp:  [18-20] 18 (06/23 1523) BP: (89-187)/(52-103) 89/52 (06/23 1523) SpO2:  [93 %-100 %] 93 % (06/23 1523)  General - cachectic, well developed, in no apparent distress.  Ophthalmologic - Fundi not visualized due to noncorporation.  Cardiovascular - Regular rate and rhythm.  Mental Status -  Level of arousal and orientation to months, age, place, and person were intact, however not orientated to the year. Language including expression, naming, repetition, comprehension was assessed and found intact, however moderate dysarthria due to poor denture without teeth.  Cranial Nerves II - XII - II - Visual field intact OU, no visual field deficit although did not blinking to visual threat on the right. III, IV, VI - Extraocular movements intact. V - Facial sensation intact bilaterally. VII - Facial movement intact bilaterally. VIII - Hearing & vestibular intact bilaterally. X - Palate elevates symmetrically. XI - Chin  turning & shoulder shrug intact bilaterally. XII - Tongue protrusion intact.  Motor Strength - The patient's strength was symmetrical in all extremities and pronator drift was absent.  Bulk was normal and fasciculations were absent.   Motor Tone - Muscle tone was assessed at the neck and appendages and was normal.  Reflexes - The patient's reflexes were 1+ in all extremities and she had no pathological reflexes.  Sensory - Light touch, temperature/pinprick were assessed and were symmetrical.    Coordination - The patient had normal movements in the hands with no ataxia or dysmetria.  Tremor was absent.  Gait and Station - deferred   ASSESSMENT/PLAN Ms. Jeanne Barker is a 83 y.o. female with history of chronic renal insufficiency, anemia, diabetes mellitus, hard of hearing (cochlear implant), hyperlipidemia, hypertension, and syphilis treated in 1993 presenting with altered mental status. She did not receive IV t-PA due to unknown time of onset.  Encephalopathy due to UTI    Resultant  encephalopathy much improved  CT head - no acute intracranial abnormality noted.  MRI head - pending (cochlear implant - ? MRI compatible?)  CTA H&N - unremarkable  CT perfusion - negative  EEG - focal cerebral dysfunction in the right posterior quadrant. No seizure  2D Echo - 08/07/2016 - EF 60-65%. No cardiac source of emboli identified.  LDL - 08/07/2016 - 52   HgbA1c pending (7.3 on 08/07/2016)  VTE prophylaxis - Lovenox DIET - DYS 1 Room service appropriate? Yes; Fluid consistency: Thin  aspirin 81 mg daily prior to admission, now on aspirin 81 mg daily. Continue aspirin on discharge  Patient counseled to be compliant with her antithrombotic medications  Ongoing aggressive stroke risk factor management  Therapy recommendations: Home health PT recommended. OT evaluation is pending.  Disposition: Pending  UTI  UA showed WBC TNTC  Ceftriaxone pharmacy consult placed  Urine  culture pending  Treatment as per primary team  Hypertension  Stable  BP goal normotensive  Diabetes  HgbA1c pending, goal < 7.0  Hyperglycemia  SSI  Other Stroke Risk Factors  Advanced age  Former cigarette smoker - quit in the past.  Other Active Problems  Anemia of chronic disease - 9.9 / 31.2  CKD stage 3 - 23 / 1.54  Hyponatremia - 131  Hearing loss - right cochlear implant  Follows with Dr. Allena Katz at [facility] day # 0  Marvel Plan, MD PhD Stroke Neurology 10/01/2016 4:48 PM  To contact Stroke Continuity provider, please refer to [email or web address]. After hours, contact General Neurology

## 2016-10-01 NOTE — Progress Notes (Signed)
[facility]                                                                              Patient Demographics  Jeanne Barker, is a 83 y.o. female, DOB - 17-Aug-1923, ZOX:[id number]  Admit date - 09/30/2016   Admitting Physician Haydee Salter, MD  Outpatient Primary MD for the patient is [name], MD  Outpatient specialists:   LOS - 0  days   Medical records reviewed and are as summarized below:    Chief Complaint  Patient presents with  . Weakness  . Altered Mental Status       Brief summary   Patient is a 83 year old female with hypertension, diabetes, hearing deficit with cochlear implants, osteoarthritis, CKD stage III, presented with altered mental status. Patient has been presenting with PT OT and home health RN since previous discharge from the hospital and over the past week, daughter reported increasing weakness, nausea, increased confusion. Recent hospitalization in April with full ischemic stroke evaluation, no MRI as patient has cochlear implants and her altered mentation was felt due to acute UTI. Patient was readmitted to the hospital one week later with bilateral leg pain, normal ABI, gabapentin was discontinued due to tremors and started on nortriptyline with significant improvement in her symptoms.  Assessment & Plan    Principal Problem: Acute metabolic Encephalopathy: Unclear etiology -Recent stroke workup in 4/18. MRI was not completed during the previous admission due to cochlear implants. - UA with culture to rule out UTI, continue gentle hydration, PTOT evaluation  - Avoid sedating medications - TSH in May 2018 3.0, folate 16.2 in May 2018, obtain ammonia level, B12 - EEG showed focal area of cerebral dysfunction in the right posterior quadrant can be seen after ischemic infarct postictal state or other causes of focal cerebral dysfunction - will await neurology recommendations regarding antiseizure medication  Active Problems:  Dehydration, mild -Presented with hyponatremia, hyperkalemia, elevated BUN and creatinine -Continue IV fluid hydration    Acute hyponatremia -Continue gentle hydration, Serum osmolarity 294 - Pending urine osmolarity    Hypertension  -Currently controlled, continue clonidine    Diabetes mellitus type 2, uncontrolled  -Continue sliding scale insulin -HgbA1c was 7.3 on 4/29    Bilateral leg pain -Improved after the addition of nortriptyline several weeks ago as recommended by OP neurologist -Continue nortriptyline for now unless recommended otherwise by neurology     Hard of hearing -Has right cochlear implant as described above    CKD (chronic kidney disease), stage III -Except for elevated BUN and creatinine stable and at baseline   Code Status: full  DVT Prophylaxis:  Lovenox  Family Communication: No family member at bedside   Disposition Plan:  Time Spent in minutes 35 minutes  Procedures:    Consultants:   Neurology  Antimicrobials:      Medications  Scheduled Meds: .  stroke: mapping our early stages of recovery book   Does not apply Once  . aspirin EC  81 mg Oral Daily  . cholecalciferol  1,000 Units Oral Daily  . cloNIDine  0.1 mg Oral BID  . enoxaparin (LOVENOX) injection  30 mg Subcutaneous Q24H  . insulin aspart  0-9 Units Subcutaneous Q4H  . linagliptin  5 mg Oral Daily  . nortriptyline  10 mg Oral QHS  . [START ON 10/02/2016] Vitamin D (Ergocalciferol)  50,000 Units Oral Q7 days   Continuous Infusions: . sodium chloride 75 mL/hr at 10/01/16 1049   PRN Meds:.acetaminophen **OR** acetaminophen (TYLENOL) oral liquid 160 mg/5 mL **OR** acetaminophen, hydrALAZINE, ondansetron (ZOFRAN) IV   Antibiotics   Anti-infectives    None        Subjective:   Marche Hottenstein was seen and examined today.  Very confused,  Objective:   Vitals:   10/01/16 0600 10/01/16 0800 10/01/16 1044 10/01/16 1200  BP: (!) 134/53 (!) 143/55 (!) 133/52 (!)  124/59  Pulse: 92 85 89 85  Resp: 20 18 18 18   Temp: 98.9 F (37.2 C) 98.1 F (36.7 C) 99.4 F (37.4 C) 98.4 F (36.9 C)  TempSrc: Oral Oral Oral Oral  SpO2: 100% 99% 99% 99%  Weight:       No intake or output data in the 24 hours ending 10/01/16 1324   Wt Readings from Last 3 Encounters:  09/30/16 58.7 kg (129 lb 6.6 oz)  09/21/16 60.4 kg (133 lb 2 oz)  09/08/16 63.5 kg (140 lb)     Exam  General: Confused, not following commands  Eyes: PERRLA, EOMI, Anicteric Sclera,  HEENT:  Atraumatic, normocephalic, normal oropharynx  Cardiovascular: S1 S2 auscultated, no rubs, murmurs or gallops. Regular rate and rhythm.  Respiratory: Clear to auscultation bilaterally, no wheezing, rales or rhonchi  Gastrointestinal: Soft, nontender, nondistended, + bowel sounds  Ext: no pedal edema bilaterally  Neuro: confused, not following commands  Musculoskeletal: No digital cyanosis, clubbing  Skin: No rashes  Psych: confused   Data Reviewed:  I have personally reviewed following labs and imaging studies  Micro Results No results found for this or any previous visit (from the past 240 hour(s)).  Radiology Reports Ct Angio Head W Or Wo Contrast  Result Date: 09/30/2016 CLINICAL DATA:  83 y/o  F; slurred speech and left-sided weakness. EXAM: CT ANGIOGRAPHY HEAD AND NECK CT PERFUSION BRAIN TECHNIQUE: Multidetector CT imaging of the head and neck was performed using the standard protocol during bolus administration of intravenous contrast. Multiplanar CT image reconstructions and MIPs were obtained to evaluate the vascular anatomy. Carotid stenosis measurements (when applicable) are obtained utilizing NASCET criteria, using the distal internal carotid diameter as the denominator. Multiphase CT imaging of the brain was performed following IV bolus contrast injection. Subsequent parametric perfusion maps were calculated using RAPID software. CONTRAST:  Cta head and neck 50 cc Isovue 370.  CT perfusion 40 cc Isovue 370. COMPARISON:  09/30/2016 CT head FINDINGS: CTA NECK FINDINGS Aortic arch: Standard branching. Imaged portion shows no evidence of aneurysm or dissection. No significant stenosis of the major arch vessel origins. Right carotid system: No evidence of dissection, stenosis (50% or greater) or occlusion. Left carotid system: No evidence of dissection, stenosis (50% or greater) or occlusion. Calcified plaque of the carotid bifurcation with minimal less than 30% stenosis of proximal ICA. Vertebral arteries: Left dominant. No evidence of dissection, stenosis (50% or greater) or occlusion. Skeleton: Grade 1 C4-5 anterolisthesis and prominent C4-5 facet arthropathy. No high-grade bony canal stenosis. Other neck: Negative. Upper chest: Negative. Review of the MIP images confirms the above findings CTA HEAD FINDINGS Anterior circulation: Moderate calcific atherosclerosis of the cavernous and paraclinoid internal carotid artery is with short segment of mild-to-moderate left-sided  paraclinoid stenosis. No significant stenosis, proximal vessel occlusion, or aneurysm. M2 inferior division short segment of mild ectasia measuring 3 mm (series 12, image 14). Posterior circulation: No significant stenosis, proximal occlusion, aneurysm, or vascular malformation. Venous sinuses: As permitted by contrast timing, patent. Anatomic variants: Right posterior communicating and anterior communicating artery is patent. No left posterior communicating artery identified, hypoplastic or absent. Review of the MIP images confirms the above findings CT Brain Perfusion Findings: Streak artifact from right-sided cochlear implant limits evaluation of right temporal lobe, brainstem, and cerebellum. CBF (<30%) Volume: 0mL Perfusion (Tmax>6.0s) volume: 0mL Mismatch Volume: 0mL Infarction Location:NA. IMPRESSION: 1. Patent circle of Willis. No large vessel occlusion, aneurysm, or significant stenosis is identified. 2. Patent  carotid and vertebral arteries. No dissection, aneurysm, or significant stenosis. 3. Streak artifact from right-sided cochlear implant limits evaluation of right temporal lobe, brainstem, and cerebellum on the CT perfusion study. In the visualized portions of the brain there is no perfusion abnormality. 4. Atherosclerosis with calcified plaque of left carotid bifurcation and the carotid siphons. Minimal ectasia of left inferior division M2 is also likely related to atherosclerotic disease. These results were called by telephone at the time of interpretation on 09/30/2016 at 2:38 pm to Dr. Rubin Payor, who verbally acknowledged these results. Electronically Signed   By: Mitzi Hansen M.D.   On: 09/30/2016 14:46   Ct Angio Neck W Or Wo Contrast  Result Date: 09/30/2016 CLINICAL DATA:  83 y/o  F; slurred speech and left-sided weakness. EXAM: CT ANGIOGRAPHY HEAD AND NECK CT PERFUSION BRAIN TECHNIQUE: Multidetector CT imaging of the head and neck was performed using the standard protocol during bolus administration of intravenous contrast. Multiplanar CT image reconstructions and MIPs were obtained to evaluate the vascular anatomy. Carotid stenosis measurements (when applicable) are obtained utilizing NASCET criteria, using the distal internal carotid diameter as the denominator. Multiphase CT imaging of the brain was performed following IV bolus contrast injection. Subsequent parametric perfusion maps were calculated using RAPID software. CONTRAST:  Cta head and neck 50 cc Isovue 370. CT perfusion 40 cc Isovue 370. COMPARISON:  09/30/2016 CT head FINDINGS: CTA NECK FINDINGS Aortic arch: Standard branching. Imaged portion shows no evidence of aneurysm or dissection. No significant stenosis of the major arch vessel origins. Right carotid system: No evidence of dissection, stenosis (50% or greater) or occlusion. Left carotid system: No evidence of dissection, stenosis (50% or greater) or occlusion. Calcified  plaque of the carotid bifurcation with minimal less than 30% stenosis of proximal ICA. Vertebral arteries: Left dominant. No evidence of dissection, stenosis (50% or greater) or occlusion. Skeleton: Grade 1 C4-5 anterolisthesis and prominent C4-5 facet arthropathy. No high-grade bony canal stenosis. Other neck: Negative. Upper chest: Negative. Review of the MIP images confirms the above findings CTA HEAD FINDINGS Anterior circulation: Moderate calcific atherosclerosis of the cavernous and paraclinoid internal carotid artery is with short segment of mild-to-moderate left-sided paraclinoid stenosis. No significant stenosis, proximal vessel occlusion, or aneurysm. M2 inferior division short segment of mild ectasia measuring 3 mm (series 12, image 14). Posterior circulation: No significant stenosis, proximal occlusion, aneurysm, or vascular malformation. Venous sinuses: As permitted by contrast timing, patent. Anatomic variants: Right posterior communicating and anterior communicating artery is patent. No left posterior communicating artery identified, hypoplastic or absent. Review of the MIP images confirms the above findings CT Brain Perfusion Findings: Streak artifact from right-sided cochlear implant limits evaluation of right temporal lobe, brainstem, and cerebellum. CBF (<30%) Volume: 0mL Perfusion (Tmax>6.0s) volume: 0mL Mismatch Volume: 0mL  Infarction Location:NA. IMPRESSION: 1. Patent circle of Willis. No large vessel occlusion, aneurysm, or significant stenosis is identified. 2. Patent carotid and vertebral arteries. No dissection, aneurysm, or significant stenosis. 3. Streak artifact from right-sided cochlear implant limits evaluation of right temporal lobe, brainstem, and cerebellum on the CT perfusion study. In the visualized portions of the brain there is no perfusion abnormality. 4. Atherosclerosis with calcified plaque of left carotid bifurcation and the carotid siphons. Minimal ectasia of left inferior  division M2 is also likely related to atherosclerotic disease. These results were called by telephone at the time of interpretation on 09/30/2016 at 2:38 pm to Dr. Rubin Payor, who verbally acknowledged these results. Electronically Signed   By: Mitzi Hansen M.D.   On: 09/30/2016 14:46   Ct Cerebral Perfusion W Contrast  Result Date: 09/30/2016 CLINICAL DATA:  83 y/o  F; slurred speech and left-sided weakness. EXAM: CT ANGIOGRAPHY HEAD AND NECK CT PERFUSION BRAIN TECHNIQUE: Multidetector CT imaging of the head and neck was performed using the standard protocol during bolus administration of intravenous contrast. Multiplanar CT image reconstructions and MIPs were obtained to evaluate the vascular anatomy. Carotid stenosis measurements (when applicable) are obtained utilizing NASCET criteria, using the distal internal carotid diameter as the denominator. Multiphase CT imaging of the brain was performed following IV bolus contrast injection. Subsequent parametric perfusion maps were calculated using RAPID software. CONTRAST:  Cta head and neck 50 cc Isovue 370. CT perfusion 40 cc Isovue 370. COMPARISON:  09/30/2016 CT head FINDINGS: CTA NECK FINDINGS Aortic arch: Standard branching. Imaged portion shows no evidence of aneurysm or dissection. No significant stenosis of the major arch vessel origins. Right carotid system: No evidence of dissection, stenosis (50% or greater) or occlusion. Left carotid system: No evidence of dissection, stenosis (50% or greater) or occlusion. Calcified plaque of the carotid bifurcation with minimal less than 30% stenosis of proximal ICA. Vertebral arteries: Left dominant. No evidence of dissection, stenosis (50% or greater) or occlusion. Skeleton: Grade 1 C4-5 anterolisthesis and prominent C4-5 facet arthropathy. No high-grade bony canal stenosis. Other neck: Negative. Upper chest: Negative. Review of the MIP images confirms the above findings CTA HEAD FINDINGS Anterior  circulation: Moderate calcific atherosclerosis of the cavernous and paraclinoid internal carotid artery is with short segment of mild-to-moderate left-sided paraclinoid stenosis. No significant stenosis, proximal vessel occlusion, or aneurysm. M2 inferior division short segment of mild ectasia measuring 3 mm (series 12, image 14). Posterior circulation: No significant stenosis, proximal occlusion, aneurysm, or vascular malformation. Venous sinuses: As permitted by contrast timing, patent. Anatomic variants: Right posterior communicating and anterior communicating artery is patent. No left posterior communicating artery identified, hypoplastic or absent. Review of the MIP images confirms the above findings CT Brain Perfusion Findings: Streak artifact from right-sided cochlear implant limits evaluation of right temporal lobe, brainstem, and cerebellum. CBF (<30%) Volume: 0mL Perfusion (Tmax>6.0s) volume: 0mL Mismatch Volume: 0mL Infarction Location:NA. IMPRESSION: 1. Patent circle of Willis. No large vessel occlusion, aneurysm, or significant stenosis is identified. 2. Patent carotid and vertebral arteries. No dissection, aneurysm, or significant stenosis. 3. Streak artifact from right-sided cochlear implant limits evaluation of right temporal lobe, brainstem, and cerebellum on the CT perfusion study. In the visualized portions of the brain there is no perfusion abnormality. 4. Atherosclerosis with calcified plaque of left carotid bifurcation and the carotid siphons. Minimal ectasia of left inferior division M2 is also likely related to atherosclerotic disease. These results were called by telephone at the time of interpretation on 09/30/2016 at 2:38  pm to Dr. Rubin PayorPickering, who verbally acknowledged these results. Electronically Signed   By: Mitzi HansenLance  Furusawa-Stratton M.D.   On: 09/30/2016 14:46   Dg Chest Port 1 View  Result Date: 09/30/2016 CLINICAL DATA:  Mental status changes for 3 weeks, a onset of confusion and  difficulty speaking and 1100 hours today EXAM: PORTABLE CHEST 1 VIEW COMPARISON:  Portable exam 1723 hours compared to 08/06/2016 FINDINGS: Normal heart size, mediastinal contours, and pulmonary vascularity. Minimal bibasilar atelectasis. Lungs otherwise clear. No pleural effusion or pneumothorax. Bones demineralized. IMPRESSION: Minimal bibasilar atelectasis. Electronically Signed   By: Ulyses SouthwardMark  Boles M.D.   On: 09/30/2016 17:30   Ct Head Code Stroke W/o Cm  Result Date: 09/30/2016 CLINICAL DATA:  Code stroke.  Slurred speech and left-sided weakness EXAM: CT HEAD WITHOUT CONTRAST TECHNIQUE: Contiguous axial images were obtained from the base of the skull through the vertex without intravenous contrast. COMPARISON:  CT 08/06/2016 FINDINGS: Brain: Streak artifact from a right-sided cochlear implant obscures much of the posterior fossa and both temporal and occipital lobes.No mass lesion, intraparenchymal hemorrhage or extra-axial collection. No evidence of acute cortical infarct. There is mild for age periventricular hypoattenuation compatible with chronic microvascular disease. Vascular: No hyperdense vessel. Bilateral atherosclerotic calcification of the carotid arteries at skullbase. Skull: Normal visualized skull base, calvarium and extracranial soft tissues. Sinuses/Orbits: No sinus fluid levels or advanced mucosal thickening. No mastoid effusion. Normal orbits. ASPECTS [facility]([place] Stroke Program Early CT Score) - Ganglionic level infarction (caudate, lentiform nuclei, internal capsule, insula, M1-M3 cortex): 7 - Supraganglionic infarction (M4-M6 cortex): 3 Total score (0-10 with 10 being normal): 10 IMPRESSION: 1. No acute intracranial abnormality. Streak artifact from right-sided cochlear implant limits assessment of the posterior fossa and right temporal lobe. 2. ASPECTS is 10. These results were called by telephone at the time of interpretation on 09/30/2016 at 2:01 pm to Dr. Geoffery LyonsUGLAS DELO , who verbally  acknowledged these results. Electronically Signed   By: Deatra RobinsonKevin  Herman M.D.   On: 09/30/2016 14:07    Lab Data:  CBC:  Recent Labs Lab 09/30/16 1331 09/30/16 1344 10/01/16 0706  WBC 6.9  --  6.9  NEUTROABS 5.0  --   --   HGB 11.3* 12.2 9.9*  HCT 34.9* 36.0 31.2*  MCV 83.1  --  81.7  PLT 313  --  279   Basic Metabolic Panel:  Recent Labs Lab 09/30/16 1331 09/30/16 1344 09/30/16 1937 10/01/16 0706  NA 130* 131*  --  131*  K 5.1 5.2*  --  4.1  CL 99* 99*  --  103  CO2 24  --   --  21*  GLUCOSE 221* 223*  --  84  BUN 25* 29*  --  23*  CREATININE 1.50* 1.40*  --  1.54*  CALCIUM 9.6  --   --  9.1  MG  --   --  1.7  --    GFR: Estimated Creatinine Clearance: 19.1 mL/min (A) (by C-G formula based on SCr of 1.54 mg/dL (H)). Liver Function Tests:  Recent Labs Lab 09/30/16 1331  AST 15  ALT 13*  ALKPHOS 81  BILITOT 0.5  PROT 6.9  ALBUMIN 3.4*   No results for input(s): LIPASE, AMYLASE in the last 168 hours. No results for input(s): AMMONIA in the last 168 hours. Coagulation Profile:  Recent Labs Lab 09/30/16 1331  INR 0.91   Cardiac Enzymes: No results for input(s): CKTOTAL, CKMB, CKMBINDEX, TROPONINI in the last 168 hours. BNP (last 3 results) No results  for input(s): PROBNP in the last 8760 hours. HbA1C: No results for input(s): HGBA1C in the last 72 hours. CBG:  Recent Labs Lab 09/30/16 2055 10/01/16 0038 10/01/16 0400 10/01/16 0755 10/01/16 1202  GLUCAP 199* 135* 127* 89 228*   Lipid Profile: No results for input(s): CHOL, HDL, LDLCALC, TRIG, CHOLHDL, LDLDIRECT in the last 72 hours. Thyroid Function Tests: No results for input(s): TSH, T4TOTAL, FREET4, T3FREE, THYROIDAB in the last 72 hours. Anemia Panel: No results for input(s): VITAMINB12, FOLATE, FERRITIN, TIBC, IRON, RETICCTPCT in the last 72 hours. Urine analysis:    Component Value Date/Time   COLORURINE YELLOW 08/06/2016 1200   APPEARANCEUR HAZY (A) 08/06/2016 1200   LABSPEC 1.005  08/06/2016 1200   PHURINE 6.0 08/06/2016 1200   GLUCOSEU 50 (A) 08/06/2016 1200   HGBUR NEGATIVE 08/06/2016 1200   BILIRUBINUR NEGATIVE 08/06/2016 1200   KETONESUR NEGATIVE 08/06/2016 1200   PROTEINUR NEGATIVE 08/06/2016 1200   UROBILINOGEN 0.2 06/18/2014 2200   NITRITE NEGATIVE 08/06/2016 1200   LEUKOCYTESUR SMALL (A) 08/06/2016 1200     Kaelea Gathright M.D. [facility] 10/01/2016, 1:24 PM  Pager: [phone] Between 7am to 7pm - call Pager - [phone]  After 7pm go to [email or web address] - password [email or web address]  Call night coverage person covering after 7pm

## 2016-10-02 ENCOUNTER — Inpatient Hospital Stay (HOSPITAL_COMMUNITY): Payer: Medicare Other

## 2016-10-02 DIAGNOSIS — N3000 Acute cystitis without hematuria: Secondary | ICD-10-CM

## 2016-10-02 DIAGNOSIS — H9193 Unspecified hearing loss, bilateral: Secondary | ICD-10-CM

## 2016-10-02 LAB — BASIC METABOLIC PANEL
Anion gap: 7 (ref 5–15)
BUN: 34 mg/dL — ABNORMAL HIGH (ref 6–20)
CHLORIDE: 106 mmol/L (ref 101–111)
CO2: 19 mmol/L — AB (ref 22–32)
CREATININE: 1.68 mg/dL — AB (ref 0.44–1.00)
Calcium: 8.9 mg/dL (ref 8.9–10.3)
GFR calc Af Amer: 30 mL/min — ABNORMAL LOW (ref >60)
GFR calc non Af Amer: 26 mL/min — ABNORMAL LOW (ref >60)
GLUCOSE: 101 mg/dL — AB (ref 65–99)
Potassium: 4.9 mmol/L (ref 3.5–5.1)
SODIUM: 132 mmol/L — AB (ref 135–145)

## 2016-10-02 LAB — CBC
HEMATOCRIT: 31.6 % — AB (ref 36.0–46.0)
Hemoglobin: 10.2 g/dL — ABNORMAL LOW (ref 12.0–15.0)
MCH: 26.9 pg (ref 26.0–34.0)
MCHC: 32.3 g/dL (ref 30.0–36.0)
MCV: 83.4 fL (ref 78.0–100.0)
Platelets: 266 10*3/uL (ref 150–400)
RBC: 3.79 MIL/uL — ABNORMAL LOW (ref 3.87–5.11)
RDW: 13.6 % (ref 11.5–15.5)
WBC: 6.5 10*3/uL (ref 4.0–10.5)

## 2016-10-02 LAB — GLUCOSE, CAPILLARY
Glucose-Capillary: 120 mg/dL — ABNORMAL HIGH (ref 65–99)
Glucose-Capillary: 182 mg/dL — ABNORMAL HIGH (ref 65–99)
Glucose-Capillary: 182 mg/dL — ABNORMAL HIGH (ref 65–99)
Glucose-Capillary: 92 mg/dL (ref 65–99)

## 2016-10-02 LAB — HEMOGLOBIN A1C
Hgb A1c MFr Bld: 7.2 % — ABNORMAL HIGH (ref 4.8–5.6)
Mean Plasma Glucose: 160 mg/dL

## 2016-10-02 MED ORDER — CEPHALEXIN 500 MG PO CAPS: 500.0000 mg | ORAL_CAPSULE | Freq: Two times a day (BID) | ORAL | 0 refills | Status: DC

## 2016-10-02 MED ORDER — CEPHALEXIN 500 MG PO CAPS: 500.0000 mg | ORAL_CAPSULE | Freq: Two times a day (BID) | ORAL | Status: DC

## 2016-10-02 NOTE — Progress Notes (Signed)
Patient's daughter was in the room when RN tried to give patient her scheduled bedtime medication.  Patient daughter advised that she did not want patient to have her medication at this time due to she still feels that the patient will choke if given the medication.   RN explained to her that patient had been seen by SLP for swallow evaluation today and told her the results from the evaluation.  Daughter still declined medication being given.

## 2016-10-02 NOTE — Progress Notes (Addendum)
STROKE TEAM PROGRESS NOTE   SUBJECTIVE (INTERVAL HISTORY) Her daughter is at the bedside. Pt no acute event overnight. UTI treated with rocephin. Not able to get MRI due to cochlear implant. Will do CT repeat.    OBJECTIVE Temp:  [97.5 F (36.4 C)-98.6 F (37 C)] 97.6 F (36.4 C) (06/24 0952) Pulse Rate:  [69-85] 85 (06/24 0952) Cardiac Rhythm: Normal sinus rhythm (06/24 0900) Resp:  [16-18] 18 (06/24 0952) BP: (89-147)/(46-59) 147/52 (06/24 0952) SpO2:  [93 %-100 %] 100 % (06/24 0952)  CBC:   Recent Labs Lab 09/30/16 1331  10/01/16 0706 10/02/16 0345  WBC 6.9  --  6.9 6.5  NEUTROABS 5.0  --   --   --   HGB 11.3*  < > 9.9* 10.2*  HCT 34.9*  < > 31.2* 31.6*  MCV 83.1  --  81.7 83.4  PLT 313  --  279 266  < > = values in this interval not displayed.  Basic Metabolic Panel:   Recent Labs Lab 09/30/16 1937 10/01/16 0706 10/02/16 0345  NA  --  131* 132*  K  --  4.1 4.9  CL  --  103 106  CO2  --  21* 19*  GLUCOSE  --  84 101*  BUN  --  23* 34*  CREATININE  --  1.54* 1.68*  CALCIUM  --  9.1 8.9  MG 1.7  --   --     Lipid Panel:     Component Value Date/Time   CHOL 144 08/07/2016 0138   TRIG 59 08/07/2016 0138   HDL 80 08/07/2016 0138   CHOLHDL 1.8 08/07/2016 0138   VLDL 12 08/07/2016 0138   LDLCALC 52 08/07/2016 0138   HgbA1c:  Lab Results  Component Value Date   HGBA1C 7.3 (H) 08/07/2016   Urine Drug Screen: No results found for: LABOPIA, COCAINSCRNUR, LABBENZ, AMPHETMU, THCU, LABBARB  Alcohol Level No results found for: ETH  IMAGING I have personally reviewed the radiological images below and agree with the radiology interpretations.  Ct Angio Head W Or Wo Contrast Ct Angio Neck W Or Wo Contrast Ct Cerebral Perfusion W Contrast 09/30/2016 1. Patent circle of Willis. No large vessel occlusion, aneurysm, or significant stenosis is identified.  2. Patent carotid and vertebral arteries. No dissection, aneurysm, or significant stenosis.  3. Streak  artifact from right-sided cochlear implant limits evaluation of right temporal lobe, brainstem, and cerebellum on the CT perfusion study. In the visualized portions of the brain there is no perfusion abnormality.  4. Atherosclerosis with calcified plaque of left carotid bifurcation and the carotid siphons. Minimal ectasia of left inferior division M2 is also likely related to atherosclerotic disease.   Dg Chest Port 1 View 09/30/2016 Minimal bibasilar atelectasis.   Ct Head Code Stroke W/o Cm 09/30/2016 1. No acute intracranial abnormality. Streak artifact from right-sided cochlear implant limits assessment of the posterior fossa and right temporal lobe.  2. ASPECTS is 10.   Transthoracic Echocardiogram 08/07/2016 Study Conclusions - Left ventricle: The cavity size was normal. Wall thickness was   increased in a pattern of mild LVH. Systolic function was normal.   The estimated ejection fraction was in the range of 60% to 65%.   Wall motion was normal; there were no regional wall motion   abnormalities. Doppler parameters are consistent with both   elevated ventricular end-diastolic filling pressure and elevated   left atrial filling pressure. - Atrial septum: No defect or patent foramen ovale was identified.  EEG 09/30/2016 1) attenuation of the posterior dominant rhythm on the right 2) irregular delta activity in the right posterior quadrant Clinical Interpretation:  This EEG is consistent with an area of focal cerebral dysfunction in the right posterior quadrant. This can be seen after ischemic infarct, postictal state, or other causes of focal cerebral dysfunction.There was no seizure or seizure predisposition recorded on this study. Please note that a normal EEG does not preclude the possibility of epilepsy.   Ct Head Wo Contrast 10/02/2016 IMPRESSION: No acute intracranial abnormality identified within limitations of artifact from cochlear implant.      PHYSICAL EXAM  Temp:   [97.5 F (36.4 C)-98.6 F (37 C)] 97.6 F (36.4 C) (06/24 0952) Pulse Rate:  [69-85] 85 (06/24 0952) Resp:  [16-18] 18 (06/24 0952) BP: (89-147)/(46-59) 147/52 (06/24 0952) SpO2:  [93 %-100 %] 100 % (06/24 0952)  General - cachectic, well developed, in no apparent distress.  Ophthalmologic - Fundi not visualized due to noncorporation.  Cardiovascular - Regular rate and rhythm.  Mental Status -  Level of arousal and orientation to months, age, place, and person were intact, however not orientated to the year. Language including expression, naming, repetition, comprehension was assessed and found intact, however moderate dysarthria due to poor denture without teeth.  Cranial Nerves II - XII - II - Visual field intact OU, no visual field deficit although did not blinking to visual threat on the right. III, IV, VI - Extraocular movements intact. V - Facial sensation intact bilaterally. VII - Facial movement intact bilaterally. VIII - Hearing & vestibular intact bilaterally. X - Palate elevates symmetrically. XI - Chin turning & shoulder shrug intact bilaterally. XII - Tongue protrusion intact.  Motor Strength - The patient's strength was symmetrical in all extremities and pronator drift was absent.  Bulk was normal and fasciculations were absent.   Motor Tone - Muscle tone was assessed at the neck and appendages and was normal.  Reflexes - The patient's reflexes were 1+ in all extremities and she had no pathological reflexes.  Sensory - Light touch, temperature/pinprick were assessed and were symmetrical.    Coordination - The patient had normal movements in the hands with no ataxia or dysmetria.  Tremor was absent.  Gait and Station - deferred   ASSESSMENT/PLAN Jeanne Barker is a 83 y.o. female with history of chronic renal insufficiency, anemia, diabetes mellitus, hard of hearing (cochlear implant), hyperlipidemia, hypertension, and syphilis treated in 1993 presenting  with altered mental status. She did not receive IV t-PA due to unknown time of onset.  Encephalopathy due to UTI    Resultant  encephalopathy much improved  CT head - no acute intracranial abnormality noted.  MRI head - cochlear implant   CTA H&N - unremarkable  Repeat Head CT - no acute abnormalities  CT perfusion - negative  EEG - focal cerebral dysfunction in the right posterior quadrant. No seizure  2D Echo - 08/07/2016 - EF 60-65%. No cardiac source of emboli identified.  LDL - 08/07/2016 - 52   HgbA1c 7.2  VTE prophylaxis - Lovenox DIET - DYS 1 Room service appropriate? Yes; Fluid consistency: Thin  aspirin 81 mg daily prior to admission, now on aspirin 81 mg daily. Continue aspirin on discharge  Patient counseled to be compliant with her antithrombotic medications  Ongoing aggressive stroke risk factor management  Therapy recommendations: Home health PT and OT recommended.  Disposition: home  UTI  UA showed WBC TNTC  On ceftriaxone  Urine culture pending  Will dc with keflex  Hypertension  Stable  BP goal normotensive  Diabetes  HgbA1c 7.2, goal < 7.0  Hyperglycemia  SSI  Follow up with PCP and plan was to have insulin injection  Other Stroke Risk Factors  Advanced age  Former cigarette smoker - quit in the past.  Other Active Problems  Anemia of chronic disease - stable  CKD stage 3 - 34 / 1.68  Hyponatremia - 131 -> 132  Hearing loss - right cochlear implant  Follows with Dr. Allena Katz at [facility] day # 1  Neurology will sign off. Please call with questions. Pt will follow up with Dr. Allena Katz at [facility] as scheduled. Thanks for the consult.  Marvel Plan, MD PhD Stroke Neurology 10/02/2016 3:15 PM   To contact Stroke Continuity provider, please refer to [email or web address]. After hours, contact General Neurology

## 2016-10-02 NOTE — Progress Notes (Signed)
Spoke with Dr. Roda ShuttersXu and he ordered to discontinue MRI and order CT head without contrast to due unknown type of cochlear implant. If negative will call Dr./ Rai for discharge orders.

## 2016-10-02 NOTE — Progress Notes (Signed)
Discharge teaching complete. Meds, diet, activity, follow up appointments all reviewed and all questions answered. Copy of instructions given to patient. Patient discharged home with daughter via wheelchair.

## 2016-10-02 NOTE — Discharge Summary (Signed)
Physician Discharge Summary   Patient ID: Jeanne Barker MRN: [id number] DOB/AGE: May 07, 1923 83 y.o.  Admit date: 09/30/2016 Discharge date: 10/02/2016  Primary Care Physician:  [name], MD  Discharge Diagnoses:    . Acute metabolic Encephalopathy  Urinary tract infection  . Hypertension . Diabetes mellitus type 2, uncontrolled (HCC) . CKD (chronic kidney disease), stage III . Dehydration, mild . Bilateral leg pain . Acute hyponatremia   Consults: Neurology, Dr Roda Shutters  Recommendations for Outpatient Follow-up:  1. PT recommended home health PT  2. Please repeat CBC/BMET at next visit 3. Please followurine cultures till final   DIET: DYSPHAGIA 1 PUREE DIET, with thin liquids     Allergies:   Allergies  Allergen Reactions  . Amlodipine Besylate Nausea Only  . Lisinopril Other (See Comments)    REACTION: hyperkalemia     DISCHARGE MEDICATIONS: Current Discharge Medication List    START taking these medications   Details  cephALEXin (KEFLEX) 500 MG capsule Take 1 capsule (500 mg total) by mouth 2 (two) times daily. X 3 days Qty: 6 capsule, Refills: 0      CONTINUE these medications which have NOT CHANGED   Details  aspirin 81 MG tablet Take 81 mg by mouth daily.      cholecalciferol (VITAMIN D) 1000 units tablet Take 1,000 Units by mouth daily.    cloNIDine (CATAPRES) 0.1 MG tablet Take 1 tablet (0.1 mg total) by mouth 2 (two) times daily. Qty: 180 tablet, Refills: 2    nortriptyline (PAMELOR) 10 MG capsule Take 1 capsule (10 mg total) by mouth at bedtime. Qty: 30 capsule, Refills: 5    sitaGLIPtin (JANUVIA) 50 MG tablet Take 1 tablet (50 mg total) by mouth daily. Qty: 90 tablet, Refills: 1    vitamin B-12 (CYANOCOBALAMIN) 100 MCG tablet Take 100 mcg by mouth daily.     Vitamin D, Ergocalciferol, (DRISDOL) 50000 units CAPS capsule Take 1 capsule (50,000 Units total) by mouth every 7 (seven) days. Qty: 12 capsule, Refills: 0    acetaminophen (TYLENOL)  500 MG tablet Take 500 mg by mouth every 6 (six) hours as needed for mild pain.     trolamine salicylate (ASPERCREME) 10 % cream Apply topically as needed. Qty: 85 g, Refills: 0   Associated Diagnoses: Osteoarthritis of right knee         Brief H and P: For complete details please refer to admission H and P, but in brief Patient is a 83 year old female with hypertension, diabetes, hearing deficit with cochlear implants, osteoarthritis, CKD stage III, presented with altered mental status. Patient has been presenting with PT OT and home health RN since previous discharge from the hospital and over the past week, daughter reported increasing weakness, nausea, increased confusion. Recent hospitalization in April with full ischemic stroke evaluation, no MRI as patient has cochlear implants and her altered mentation was felt due to acute UTI. Patient was readmitted to the hospital one week later with bilateral leg pain, normal ABI, gabapentin was discontinued due to tremors and started on nortriptyline with significant improvement in her symptoms.  Hospital Course:  Acute metabolic Encephalopathy: likely due to UTI -Recent stroke workup in 4/18. MRI was not completed during the previous admission due to cochlear implants. - TSH in May 2018 3.0, folate 16.2 in May 2018 - EEG showed focal area of cerebral dysfunction in the right posterior quadrant can be seen after ischemic infarct postictal state or other causes of focal cerebral dysfunction. No antiseizure medication recommended  by neurology.  - Per neurology, CT head was repeated again as patient could not get the MRI which was negative for any new infarct or ischemia - UA was positive for UTI, patient was started on IV rocephin.  - B12 4918, ammonia level 37. Patient is now more alert and orient, has hearing deficit, otherwise appears at baseline. Neurology recommended no further work-up and discharge home.     Dehydration, mild -Presented  with hyponatremia, hyperkalemia, elevated BUN and creatinine -Patient was placed on  IV fluid hydration, Cr remained at baseline  Na 132 at discharge  Acute hyponatremia - Serum osmolarity 294, urine osm 496, may have some underlying SIADH - Na 132 at discharge, at baseline, alert and oriented   Hypertension  -Currently controlled, continue clonidine  Diabetes mellitus type 2, uncontrolled  -Continue sliding scale insulin -HgbA1c was 7.3 on 4/29  Bilateral leg pain -Improved after the addition of nortriptyline several weeks ago as recommended by OP neurologist -Continue nortriptyline for now unless recommended otherwise by neurology   Hard of hearing -Has right cochlear implant as described above  CKD (chronic kidney disease), stage III -Except for elevated BUN and creatinine stable and at baseline  Day of Discharge BP (!) 147/52 (BP Location: Left Arm)   Pulse 85   Temp 97.6 F (36.4 C) (Oral)   Resp 18   Wt 58.7 kg (129 lb 6.6 oz)   SpO2 100%   BMI 25.27 kg/m   Physical Exam: General: Alert and awake oriented, not in any acute distress, hearing deficit HEENT: anicteric sclera, pupils reactive to light and accommodation CVS: S1-S2 clear no murmur rubs or gallops Chest: clear to auscultation bilaterally, no wheezing rales or rhonchi Abdomen: soft nontender, nondistended, normal bowel sounds Extremities: no cyanosis, clubbing or edema noted bilaterally Neuro:strength improved in all extremities, alert and oriented    The results of significant diagnostics from this hospitalization (including imaging, microbiology, ancillary and laboratory) are listed below for reference.    LAB RESULTS: Basic Metabolic Panel:  Recent Labs Lab 09/30/16 1937 10/01/16 0706 10/02/16 0345  NA  --  131* 132*  K  --  4.1 4.9  CL  --  103 106  CO2  --  21* 19*  GLUCOSE  --  84 101*  BUN  --  23* 34*  CREATININE  --  1.54* 1.68*  CALCIUM  --  9.1 8.9  MG 1.7  --    --    Liver Function Tests:  Recent Labs Lab 09/30/16 1331  AST 15  ALT 13*  ALKPHOS 81  BILITOT 0.5  PROT 6.9  ALBUMIN 3.4*   No results for input(s): LIPASE, AMYLASE in the last 168 hours.  Recent Labs Lab 10/01/16 1410  AMMONIA 37*   CBC:  Recent Labs Lab 09/30/16 1331  10/01/16 0706 10/02/16 0345  WBC 6.9  --  6.9 6.5  NEUTROABS 5.0  --   --   --   HGB 11.3*  < > 9.9* 10.2*  HCT 34.9*  < > 31.2* 31.6*  MCV 83.1  --  81.7 83.4  PLT 313  --  279 266  < > = values in this interval not displayed. Cardiac Enzymes: No results for input(s): CKTOTAL, CKMB, CKMBINDEX, TROPONINI in the last 168 hours. BNP: Invalid input(s): POCBNP CBG:  Recent Labs Lab 10/02/16 0750 10/02/16 1130  GLUCAP 92 182*    Significant Diagnostic Studies:  Ct Angio Head W Or Wo Contrast  Result Date: 09/30/2016 CLINICAL  DATA:  83 y/o  F; slurred speech and left-sided weakness. EXAM: CT ANGIOGRAPHY HEAD AND NECK CT PERFUSION BRAIN TECHNIQUE: Multidetector CT imaging of the head and neck was performed using the standard protocol during bolus administration of intravenous contrast. Multiplanar CT image reconstructions and MIPs were obtained to evaluate the vascular anatomy. Carotid stenosis measurements (when applicable) are obtained utilizing NASCET criteria, using the distal internal carotid diameter as the denominator. Multiphase CT imaging of the brain was performed following IV bolus contrast injection. Subsequent parametric perfusion maps were calculated using RAPID software. CONTRAST:  Cta head and neck 50 cc Isovue 370. CT perfusion 40 cc Isovue 370. COMPARISON:  09/30/2016 CT head FINDINGS: CTA NECK FINDINGS Aortic arch: Standard branching. Imaged portion shows no evidence of aneurysm or dissection. No significant stenosis of the major arch vessel origins. Right carotid system: No evidence of dissection, stenosis (50% or greater) or occlusion. Left carotid system: No evidence of  dissection, stenosis (50% or greater) or occlusion. Calcified plaque of the carotid bifurcation with minimal less than 30% stenosis of proximal ICA. Vertebral arteries: Left dominant. No evidence of dissection, stenosis (50% or greater) or occlusion. Skeleton: Grade 1 C4-5 anterolisthesis and prominent C4-5 facet arthropathy. No high-grade bony canal stenosis. Other neck: Negative. Upper chest: Negative. Review of the MIP images confirms the above findings CTA HEAD FINDINGS Anterior circulation: Moderate calcific atherosclerosis of the cavernous and paraclinoid internal carotid artery is with short segment of mild-to-moderate left-sided paraclinoid stenosis. No significant stenosis, proximal vessel occlusion, or aneurysm. M2 inferior division short segment of mild ectasia measuring 3 mm (series 12, image 14). Posterior circulation: No significant stenosis, proximal occlusion, aneurysm, or vascular malformation. Venous sinuses: As permitted by contrast timing, patent. Anatomic variants: Right posterior communicating and anterior communicating artery is patent. No left posterior communicating artery identified, hypoplastic or absent. Review of the MIP images confirms the above findings CT Brain Perfusion Findings: Streak artifact from right-sided cochlear implant limits evaluation of right temporal lobe, brainstem, and cerebellum. CBF (<30%) Volume: 0mL Perfusion (Tmax>6.0s) volume: 0mL Mismatch Volume: 0mL Infarction Location:NA. IMPRESSION: 1. Patent circle of Willis. No large vessel occlusion, aneurysm, or significant stenosis is identified. 2. Patent carotid and vertebral arteries. No dissection, aneurysm, or significant stenosis. 3. Streak artifact from right-sided cochlear implant limits evaluation of right temporal lobe, brainstem, and cerebellum on the CT perfusion study. In the visualized portions of the brain there is no perfusion abnormality. 4. Atherosclerosis with calcified plaque of left carotid  bifurcation and the carotid siphons. Minimal ectasia of left inferior division M2 is also likely related to atherosclerotic disease. These results were called by telephone at the time of interpretation on 09/30/2016 at 2:38 pm to Dr. Rubin Payor, who verbally acknowledged these results. Electronically Signed   By: Mitzi Hansen M.D.   On: 09/30/2016 14:46   Ct Angio Neck W Or Wo Contrast  Result Date: 09/30/2016 CLINICAL DATA:  83 y/o  F; slurred speech and left-sided weakness. EXAM: CT ANGIOGRAPHY HEAD AND NECK CT PERFUSION BRAIN TECHNIQUE: Multidetector CT imaging of the head and neck was performed using the standard protocol during bolus administration of intravenous contrast. Multiplanar CT image reconstructions and MIPs were obtained to evaluate the vascular anatomy. Carotid stenosis measurements (when applicable) are obtained utilizing NASCET criteria, using the distal internal carotid diameter as the denominator. Multiphase CT imaging of the brain was performed following IV bolus contrast injection. Subsequent parametric perfusion maps were calculated using RAPID software. CONTRAST:  Cta head and neck 50 cc  Isovue 370. CT perfusion 40 cc Isovue 370. COMPARISON:  09/30/2016 CT head FINDINGS: CTA NECK FINDINGS Aortic arch: Standard branching. Imaged portion shows no evidence of aneurysm or dissection. No significant stenosis of the major arch vessel origins. Right carotid system: No evidence of dissection, stenosis (50% or greater) or occlusion. Left carotid system: No evidence of dissection, stenosis (50% or greater) or occlusion. Calcified plaque of the carotid bifurcation with minimal less than 30% stenosis of proximal ICA. Vertebral arteries: Left dominant. No evidence of dissection, stenosis (50% or greater) or occlusion. Skeleton: Grade 1 C4-5 anterolisthesis and prominent C4-5 facet arthropathy. No high-grade bony canal stenosis. Other neck: Negative. Upper chest: Negative. Review of the MIP  images confirms the above findings CTA HEAD FINDINGS Anterior circulation: Moderate calcific atherosclerosis of the cavernous and paraclinoid internal carotid artery is with short segment of mild-to-moderate left-sided paraclinoid stenosis. No significant stenosis, proximal vessel occlusion, or aneurysm. M2 inferior division short segment of mild ectasia measuring 3 mm (series 12, image 14). Posterior circulation: No significant stenosis, proximal occlusion, aneurysm, or vascular malformation. Venous sinuses: As permitted by contrast timing, patent. Anatomic variants: Right posterior communicating and anterior communicating artery is patent. No left posterior communicating artery identified, hypoplastic or absent. Review of the MIP images confirms the above findings CT Brain Perfusion Findings: Streak artifact from right-sided cochlear implant limits evaluation of right temporal lobe, brainstem, and cerebellum. CBF (<30%) Volume: 0mL Perfusion (Tmax>6.0s) volume: 0mL Mismatch Volume: 0mL Infarction Location:NA. IMPRESSION: 1. Patent circle of Willis. No large vessel occlusion, aneurysm, or significant stenosis is identified. 2. Patent carotid and vertebral arteries. No dissection, aneurysm, or significant stenosis. 3. Streak artifact from right-sided cochlear implant limits evaluation of right temporal lobe, brainstem, and cerebellum on the CT perfusion study. In the visualized portions of the brain there is no perfusion abnormality. 4. Atherosclerosis with calcified plaque of left carotid bifurcation and the carotid siphons. Minimal ectasia of left inferior division M2 is also likely related to atherosclerotic disease. These results were called by telephone at the time of interpretation on 09/30/2016 at 2:38 pm to Dr. Rubin PayorPickering, who verbally acknowledged these results. Electronically Signed   By: Mitzi HansenLance  Furusawa-Stratton M.D.   On: 09/30/2016 14:46   Ct Cerebral Perfusion W Contrast  Result Date:  09/30/2016 CLINICAL DATA:  83 y/o  F; slurred speech and left-sided weakness. EXAM: CT ANGIOGRAPHY HEAD AND NECK CT PERFUSION BRAIN TECHNIQUE: Multidetector CT imaging of the head and neck was performed using the standard protocol during bolus administration of intravenous contrast. Multiplanar CT image reconstructions and MIPs were obtained to evaluate the vascular anatomy. Carotid stenosis measurements (when applicable) are obtained utilizing NASCET criteria, using the distal internal carotid diameter as the denominator. Multiphase CT imaging of the brain was performed following IV bolus contrast injection. Subsequent parametric perfusion maps were calculated using RAPID software. CONTRAST:  Cta head and neck 50 cc Isovue 370. CT perfusion 40 cc Isovue 370. COMPARISON:  09/30/2016 CT head FINDINGS: CTA NECK FINDINGS Aortic arch: Standard branching. Imaged portion shows no evidence of aneurysm or dissection. No significant stenosis of the major arch vessel origins. Right carotid system: No evidence of dissection, stenosis (50% or greater) or occlusion. Left carotid system: No evidence of dissection, stenosis (50% or greater) or occlusion. Calcified plaque of the carotid bifurcation with minimal less than 30% stenosis of proximal ICA. Vertebral arteries: Left dominant. No evidence of dissection, stenosis (50% or greater) or occlusion. Skeleton: Grade 1 C4-5 anterolisthesis and prominent C4-5 facet arthropathy. No  high-grade bony canal stenosis. Other neck: Negative. Upper chest: Negative. Review of the MIP images confirms the above findings CTA HEAD FINDINGS Anterior circulation: Moderate calcific atherosclerosis of the cavernous and paraclinoid internal carotid artery is with short segment of mild-to-moderate left-sided paraclinoid stenosis. No significant stenosis, proximal vessel occlusion, or aneurysm. M2 inferior division short segment of mild ectasia measuring 3 mm (series 12, image 14). Posterior circulation:  No significant stenosis, proximal occlusion, aneurysm, or vascular malformation. Venous sinuses: As permitted by contrast timing, patent. Anatomic variants: Right posterior communicating and anterior communicating artery is patent. No left posterior communicating artery identified, hypoplastic or absent. Review of the MIP images confirms the above findings CT Brain Perfusion Findings: Streak artifact from right-sided cochlear implant limits evaluation of right temporal lobe, brainstem, and cerebellum. CBF (<30%) Volume: 0mL Perfusion (Tmax>6.0s) volume: 0mL Mismatch Volume: 0mL Infarction Location:NA. IMPRESSION: 1. Patent circle of Willis. No large vessel occlusion, aneurysm, or significant stenosis is identified. 2. Patent carotid and vertebral arteries. No dissection, aneurysm, or significant stenosis. 3. Streak artifact from right-sided cochlear implant limits evaluation of right temporal lobe, brainstem, and cerebellum on the CT perfusion study. In the visualized portions of the brain there is no perfusion abnormality. 4. Atherosclerosis with calcified plaque of left carotid bifurcation and the carotid siphons. Minimal ectasia of left inferior division M2 is also likely related to atherosclerotic disease. These results were called by telephone at the time of interpretation on 09/30/2016 at 2:38 pm to Dr. Rubin Payor, who verbally acknowledged these results. Electronically Signed   By: Mitzi Hansen M.D.   On: 09/30/2016 14:46   Dg Chest Port 1 View  Result Date: 09/30/2016 CLINICAL DATA:  Mental status changes for 3 weeks, a onset of confusion and difficulty speaking and 1100 hours today EXAM: PORTABLE CHEST 1 VIEW COMPARISON:  Portable exam 1723 hours compared to 08/06/2016 FINDINGS: Normal heart size, mediastinal contours, and pulmonary vascularity. Minimal bibasilar atelectasis. Lungs otherwise clear. No pleural effusion or pneumothorax. Bones demineralized. IMPRESSION: Minimal bibasilar  atelectasis. Electronically Signed   By: Ulyses Southward M.D.   On: 09/30/2016 17:30   Ct Head Code Stroke W/o Cm  Result Date: 09/30/2016 CLINICAL DATA:  Code stroke.  Slurred speech and left-sided weakness EXAM: CT HEAD WITHOUT CONTRAST TECHNIQUE: Contiguous axial images were obtained from the base of the skull through the vertex without intravenous contrast. COMPARISON:  CT 08/06/2016 FINDINGS: Brain: Streak artifact from a right-sided cochlear implant obscures much of the posterior fossa and both temporal and occipital lobes.No mass lesion, intraparenchymal hemorrhage or extra-axial collection. No evidence of acute cortical infarct. There is mild for age periventricular hypoattenuation compatible with chronic microvascular disease. Vascular: No hyperdense vessel. Bilateral atherosclerotic calcification of the carotid arteries at skullbase. Skull: Normal visualized skull base, calvarium and extracranial soft tissues. Sinuses/Orbits: No sinus fluid levels or advanced mucosal thickening. No mastoid effusion. Normal orbits. ASPECTS [facility] Stroke Program Early CT Score) - Ganglionic level infarction (caudate, lentiform nuclei, internal capsule, insula, M1-M3 cortex): 7 - Supraganglionic infarction (M4-M6 cortex): 3 Total score (0-10 with 10 being normal): 10 IMPRESSION: 1. No acute intracranial abnormality. Streak artifact from right-sided cochlear implant limits assessment of the posterior fossa and right temporal lobe. 2. ASPECTS is 10. These results were called by telephone at the time of interpretation on 09/30/2016 at 2:01 pm to Dr. Geoffery Lyons , who verbally acknowledged these results. Electronically Signed   By: Deatra Robinson M.D.   On: 09/30/2016 14:07    2D ECHO:  Disposition and Follow-up: Discharge Instructions    Ambulatory referral to Neurology    Complete by:  As directed    An appointment is requested in approximately: 6 Week(s): sroke       DISPOSITION: Home  DISCHARGE  FOLLOW-UP Follow-up Information    [name], MD. Schedule an appointment as soon as possible for a visit in 2 week(s).   Specialty:  Internal Medicine Contact information: [address] [place] [address] [phone]        Marvel Plan, MD. Schedule an appointment as soon as possible for a visit in 6 week(s).   Specialty:  Neurology Contact information: [address] [place] [address] [phone]            Time spent on Discharge: 35 mins   Signed:   Thad Ranger M.D. [facility] 10/02/2016, 12:17 PM Pager: [phone]

## 2016-10-03 ENCOUNTER — Telehealth: Payer: Self-pay

## 2016-10-03 ENCOUNTER — Telehealth: Payer: Self-pay | Admitting: *Deleted

## 2016-10-03 ENCOUNTER — Ambulatory Visit: Payer: Medicare Other | Admitting: Internal Medicine

## 2016-10-03 LAB — URINE CULTURE

## 2016-10-03 NOTE — Telephone Encounter (Signed)
Received Physician Orders from [facility], forwarded to provider/SLS 06/25

## 2016-10-03 NOTE — Telephone Encounter (Signed)
10/03/16  Transition Care Management Follow-up Telephone Call  ADMISSION DATE: 09/30/16  DISCHARGE DATE: 10/02/16   How have you been since you were released from the hospital?   Do you understand why you were in the hospital? Yes per daughter   Do you understand the discharge instrcutions? Yes per daughter    Items Reviewed:  Medications reviewed: Yes   Allergies reviewed:Yes   Dietary changes reviewed: Puree diet, thin liquids   Referrals reviewed:Follow up scheduled with Dr. Drue NovelPaz   Functional Questionnaire:   Activities of Daily Living (ADLs): Needs assistance at this time.  Any patient concerns?  Would like to discuss overall health with Dr. Drue NovelPaz   Confirmed importance and date/time of follow-up visits scheduled: Yes Confirmed with patient if condition begins to worsen call PCP or go to the ER.  Yes   Patient was given the office number and encouragred to call back with questions or concerns.Yes

## 2016-10-04 ENCOUNTER — Telehealth: Payer: Self-pay

## 2016-10-04 DIAGNOSIS — M1711 Unilateral primary osteoarthritis, right knee: Secondary | ICD-10-CM | POA: Diagnosis not present

## 2016-10-04 DIAGNOSIS — E1122 Type 2 diabetes mellitus with diabetic chronic kidney disease: Secondary | ICD-10-CM | POA: Diagnosis not present

## 2016-10-04 DIAGNOSIS — M79662 Pain in left lower leg: Secondary | ICD-10-CM | POA: Diagnosis not present

## 2016-10-04 DIAGNOSIS — N184 Chronic kidney disease, stage 4 (severe): Secondary | ICD-10-CM | POA: Diagnosis not present

## 2016-10-04 DIAGNOSIS — M79661 Pain in right lower leg: Secondary | ICD-10-CM | POA: Diagnosis not present

## 2016-10-04 DIAGNOSIS — I129 Hypertensive chronic kidney disease with stage 1 through stage 4 chronic kidney disease, or unspecified chronic kidney disease: Secondary | ICD-10-CM | POA: Diagnosis not present

## 2016-10-04 MED ORDER — AMOXICILLIN 875 MG PO TABS: 875.0000 mg | ORAL_TABLET | Freq: Two times a day (BID) | ORAL | 0 refills | Status: DC

## 2016-10-04 NOTE — Telephone Encounter (Signed)
Spoke w/ Pt's daughter Richarda OsmondMollie, informed of results and recommendations. Keflex d/c, Amoxicillin 875mg  [facility] Hosp f/u scheduled for 10/18/2016.

## 2016-10-04 NOTE — Telephone Encounter (Signed)
Result Note   Has a UTI, @ the hospital was treated with Rocephin and Keflex.  Advise patient's daughter:  Stop Keflex  Amoxicillin 875 mg one tablet twice a day #10 no refills.

## 2016-10-04 NOTE — Telephone Encounter (Signed)
Signed orders mailed to [facility] in provided envelope at:   [facility] [address] [place], [place] [address]  Copies of form sent for scanning.

## 2016-10-05 DIAGNOSIS — I129 Hypertensive chronic kidney disease with stage 1 through stage 4 chronic kidney disease, or unspecified chronic kidney disease: Secondary | ICD-10-CM | POA: Diagnosis not present

## 2016-10-05 DIAGNOSIS — M1711 Unilateral primary osteoarthritis, right knee: Secondary | ICD-10-CM | POA: Diagnosis not present

## 2016-10-05 DIAGNOSIS — M79661 Pain in right lower leg: Secondary | ICD-10-CM | POA: Diagnosis not present

## 2016-10-05 DIAGNOSIS — E1122 Type 2 diabetes mellitus with diabetic chronic kidney disease: Secondary | ICD-10-CM | POA: Diagnosis not present

## 2016-10-05 DIAGNOSIS — M79662 Pain in left lower leg: Secondary | ICD-10-CM | POA: Diagnosis not present

## 2016-10-05 DIAGNOSIS — N184 Chronic kidney disease, stage 4 (severe): Secondary | ICD-10-CM | POA: Diagnosis not present

## 2016-10-08 DIAGNOSIS — M1711 Unilateral primary osteoarthritis, right knee: Secondary | ICD-10-CM | POA: Diagnosis not present

## 2016-10-08 DIAGNOSIS — N184 Chronic kidney disease, stage 4 (severe): Secondary | ICD-10-CM | POA: Diagnosis not present

## 2016-10-08 DIAGNOSIS — M79662 Pain in left lower leg: Secondary | ICD-10-CM | POA: Diagnosis not present

## 2016-10-08 DIAGNOSIS — M79661 Pain in right lower leg: Secondary | ICD-10-CM | POA: Diagnosis not present

## 2016-10-08 DIAGNOSIS — E1122 Type 2 diabetes mellitus with diabetic chronic kidney disease: Secondary | ICD-10-CM | POA: Diagnosis not present

## 2016-10-08 DIAGNOSIS — I129 Hypertensive chronic kidney disease with stage 1 through stage 4 chronic kidney disease, or unspecified chronic kidney disease: Secondary | ICD-10-CM | POA: Diagnosis not present

## 2016-10-10 ENCOUNTER — Encounter (HOSPITAL_COMMUNITY): Payer: Self-pay | Admitting: *Deleted

## 2016-10-10 ENCOUNTER — Telehealth: Payer: Self-pay | Admitting: *Deleted

## 2016-10-10 DIAGNOSIS — R739 Hyperglycemia, unspecified: Secondary | ICD-10-CM | POA: Diagnosis not present

## 2016-10-10 DIAGNOSIS — R531 Weakness: Secondary | ICD-10-CM | POA: Insufficient documentation

## 2016-10-10 DIAGNOSIS — Z5321 Procedure and treatment not carried out due to patient leaving prior to being seen by health care provider: Secondary | ICD-10-CM | POA: Insufficient documentation

## 2016-10-10 LAB — CBG MONITORING, ED: Glucose-Capillary: 211 mg/dL — ABNORMAL HIGH (ref 65–99)

## 2016-10-10 NOTE — Telephone Encounter (Signed)
Received Physician Orders from [facility] [envelope not attached], will create SA envelope for returning paperwork by mail;  forwarded to provider/SLS

## 2016-10-10 NOTE — ED Triage Notes (Signed)
Per family, pt had an episode of hyperglycemia and generalized weakness tonight. CBG in the 200s. Family also reports garbled speech at that time. No neuro deficits at present. Pt had similar episode a month ago and was admitted.

## 2016-10-11 ENCOUNTER — Emergency Department (HOSPITAL_COMMUNITY)
Admission: EM | Admit: 2016-10-11 | Discharge: 2016-10-11 | Discharge status: Against Medical Advice | Payer: Medicare Other | Attending: Emergency Medicine | Admitting: Emergency Medicine

## 2016-10-11 NOTE — Telephone Encounter (Signed)
Form signed, and mailed back to [facility] at:   [address] [place], [place] [id number]  Copy of form sent for scanning.

## 2016-10-11 NOTE — ED Notes (Signed)
Patient's family member up to desk.  States she would like to take patient home.  This RN reviewed chart and discussed patient's initial complaints.  This RN offered to put patient on a bench with warm blankets for comfort, since patient states she does not want to wait in wheelchair anymore.  Patient's family stated sharply to RN "she just wants to home!".  This RN apologized, and stated she would remove patient from chart.

## 2016-10-14 DIAGNOSIS — E1122 Type 2 diabetes mellitus with diabetic chronic kidney disease: Secondary | ICD-10-CM | POA: Diagnosis not present

## 2016-10-14 DIAGNOSIS — M79662 Pain in left lower leg: Secondary | ICD-10-CM | POA: Diagnosis not present

## 2016-10-14 DIAGNOSIS — N184 Chronic kidney disease, stage 4 (severe): Secondary | ICD-10-CM | POA: Diagnosis not present

## 2016-10-14 DIAGNOSIS — M79661 Pain in right lower leg: Secondary | ICD-10-CM | POA: Diagnosis not present

## 2016-10-14 DIAGNOSIS — M1711 Unilateral primary osteoarthritis, right knee: Secondary | ICD-10-CM | POA: Diagnosis not present

## 2016-10-14 DIAGNOSIS — I129 Hypertensive chronic kidney disease with stage 1 through stage 4 chronic kidney disease, or unspecified chronic kidney disease: Secondary | ICD-10-CM | POA: Diagnosis not present

## 2016-10-17 ENCOUNTER — Telehealth: Payer: Self-pay | Admitting: Internal Medicine

## 2016-10-17 NOTE — Telephone Encounter (Signed)
Spoke w/ Nicolette, verbal orders given.  

## 2016-10-17 NOTE — Telephone Encounter (Signed)
Caller name: Nicolette B Relation to pt: LPN from [facility]  Call back number: [phone]    Reason for call:  Requesting verbal orders for PT 2x 4

## 2016-10-18 ENCOUNTER — Ambulatory Visit (INDEPENDENT_AMBULATORY_CARE_PROVIDER_SITE_OTHER): Payer: Medicare Other | Admitting: Internal Medicine

## 2016-10-18 ENCOUNTER — Encounter: Payer: Self-pay | Admitting: Internal Medicine

## 2016-10-18 ENCOUNTER — Telehealth: Payer: Self-pay | Admitting: *Deleted

## 2016-10-18 VITALS — BP 128/70 | HR 86 | Temp 98.1°F | Resp 14 | Ht 60.0 in | Wt 133.0 lb

## 2016-10-18 DIAGNOSIS — N390 Urinary tract infection, site not specified: Secondary | ICD-10-CM | POA: Diagnosis not present

## 2016-10-18 DIAGNOSIS — G9340 Encephalopathy, unspecified: Secondary | ICD-10-CM

## 2016-10-18 DIAGNOSIS — I639 Cerebral infarction, unspecified: Secondary | ICD-10-CM

## 2016-10-18 DIAGNOSIS — M25569 Pain in unspecified knee: Secondary | ICD-10-CM | POA: Diagnosis not present

## 2016-10-18 DIAGNOSIS — R2990 Unspecified symptoms and signs involving the nervous system: Secondary | ICD-10-CM

## 2016-10-18 DIAGNOSIS — Z8673 Personal history of transient ischemic attack (TIA), and cerebral infarction without residual deficits: Secondary | ICD-10-CM

## 2016-10-18 LAB — CBC WITH DIFFERENTIAL/PLATELET
BASOS ABS: 0 10*3/uL (ref 0.0–0.1)
Basophils Relative: 0.3 % (ref 0.0–3.0)
EOS ABS: 0.1 10*3/uL (ref 0.0–0.7)
Eosinophils Relative: 2 % (ref 0.0–5.0)
HCT: 32.3 % — ABNORMAL LOW (ref 36.0–46.0)
Hemoglobin: 10.6 g/dL — ABNORMAL LOW (ref 12.0–15.0)
LYMPHS ABS: 1.6 10*3/uL (ref 0.7–4.0)
LYMPHS PCT: 27.8 % (ref 12.0–46.0)
MCHC: 32.8 g/dL (ref 30.0–36.0)
MCV: 83.1 fl (ref 78.0–100.0)
Monocytes Absolute: 0.6 10*3/uL (ref 0.1–1.0)
Monocytes Relative: 10.4 % (ref 3.0–12.0)
NEUTROS ABS: 3.4 10*3/uL (ref 1.4–7.7)
NEUTROS PCT: 59.5 % (ref 43.0–77.0)
PLATELETS: 369 10*3/uL (ref 150.0–400.0)
RBC: 3.88 Mil/uL (ref 3.87–5.11)
RDW: 13.6 % (ref 11.5–15.5)
WBC: 5.7 10*3/uL (ref 4.0–10.5)

## 2016-10-18 LAB — BASIC METABOLIC PANEL
BUN: 17 mg/dL (ref 6–23)
CALCIUM: 9.6 mg/dL (ref 8.4–10.5)
CO2: 26 meq/L (ref 19–32)
Chloride: 96 mEq/L (ref 96–112)
Creatinine, Ser: 1.41 mg/dL — ABNORMAL HIGH (ref 0.40–1.20)
GFR: 44.94 mL/min — ABNORMAL LOW (ref >60.00)
GLUCOSE: 151 mg/dL — AB (ref 70–99)
Potassium: 5 mEq/L (ref 3.5–5.1)
Sodium: 128 mEq/L — ABNORMAL LOW (ref 135–145)

## 2016-10-18 MED ORDER — DICLOFENAC SODIUM 3 % EX CREA: 2.0000 g | TOPICAL_CREAM | Freq: Two times a day (BID) | CUTANEOUS | 0 refills | Status: DC | PRN

## 2016-10-18 NOTE — Telephone Encounter (Signed)
Received Home Health Plan of Care Update; forwarded to provider w/SAS envelope attached/SLS 07/10

## 2016-10-18 NOTE — Patient Instructions (Signed)
GO TO THE LAB : Get the blood work     GO TO THE FRONT DESK Schedule your next appointment for a checkup in 2 months   For pain: Tylenol 500 mg one tablet every 6 hours as needed Ice the knee twice a day Use diclofenac cream twice a day as needed

## 2016-10-18 NOTE — Progress Notes (Signed)
Pre visit review using our clinic review tool, if applicable. No additional management support is needed unless otherwise documented below in the visit note. 

## 2016-10-18 NOTE — Telephone Encounter (Signed)
Plan of Care signed, original [facility] to [facility] w/ envelope provided. Copy of plan of care sent for scanning.

## 2016-10-18 NOTE — Progress Notes (Signed)
Subjective:    Patient ID: Jeanne Barker, female    DOB: 1923/07/15, 83 y.o.   MRN: [id number]  DOS:  10/18/2016 Type of visit - description : TCM 14 Interval history: admitted to the hospital and discharged 10-02-16  Admitted with mental status changes /encephalopathy in the context of recent ischemic stroke, April 2018. Neurology was consulted, they recommended EEG, there was some cerebral dysfunction of the area of previous ischemic infarct, no antiseizure medication recommended. CT head  was repeated, negative for acute changes, unable to do MRI due to cochlear implants. At the end, mental status changes were felt to be due to UTI. She was also mildly dehydrated. UA was positive for UTI, got Rocephin IV, discharged on Keflex. UCX showed enterococcus, I changed her to amoxicillin .  Was recommended home PT, CBC, BMP and dysphagia  1 puree diet with thin liquids.  Review of Systems Since she left the hospital she is improving. Denies any fever chills Appetite is okay No nausea, vomiting, diarrhea. Pain from the waist down has improved however today she complained of right knee pain: Worse when she stands or tries to walk, feels like a "click" in there. No joint is not hot or swollen according to the patient is somewhat frustrated because she still cannot back to work  Past Medical History:  Diagnosis Date  . Anemia   . Chronic renal insufficiency   . Diabetes mellitus   . Hard of hearing   . Hyperlipidemia   . Hypertension   . Osteoarthritis   . Syphilis    ?latent?, treated c/ PCN 1993    Past Surgical History:  Procedure Laterality Date  . ABDOMINAL HYSTERECTOMY     BSO  . CATARACT EXTRACTION    . cochlear impalnt  1/02  . NEPHRECTOMY     left  . OOPHORECTOMY      Social History   Social History  . Marital status: Single    Spouse name: N/A  . Number of children: 7  . Years of education: 3rd grade   Occupational History  . PT job   .  Retired   Social  History Main Topics  . Smoking status: Former Games developer  . Smokeless tobacco: Never Used  . Alcohol use No  . Drug use: No  . Sexual activity: No   Other Topics Concern  . Not on file   Social History Narrative   Patient still lives at home but someone is always with her.  Has 7 children.  Retired from [employer] officer.  Education: 3rd grade.    Her daughter Kirt Boys [phone])  lives 20 minutes away and usually comes w/ the pt to the office visit      Allergies as of 10/18/2016      Reactions   Amlodipine Besylate Nausea Only   Lisinopril Other (See Comments)   REACTION: hyperkalemia      Medication List       Accurate as of 10/18/16 11:59 PM. Always use your most recent med list.          acetaminophen 500 MG tablet Commonly known as:  TYLENOL Take 500 mg by mouth every 6 (six) hours as needed for mild pain.   aspirin 81 MG tablet Take 81 mg by mouth daily.   cholecalciferol 1000 units tablet Commonly known as:  VITAMIN D Take 1,000 Units by mouth daily.   cloNIDine 0.1 MG tablet Commonly known as:  CATAPRES Take 1 tablet (0.1 mg total)  by mouth 2 (two) times daily.   Diclofenac Sodium 3 % Crea Apply 2 g topically 2 (two) times daily as needed.   nortriptyline 10 MG capsule Commonly known as:  PAMELOR Take 1 capsule (10 mg total) by mouth at bedtime.   sitaGLIPtin 50 MG tablet Commonly known as:  JANUVIA Take 1 tablet (50 mg total) by mouth daily.   vitamin B-12 100 MCG tablet Commonly known as:  CYANOCOBALAMIN Take 100 mcg by mouth daily.   Vitamin D (Ergocalciferol) 50000 units Caps capsule Commonly known as:  DRISDOL Take 1 capsule (50,000 Units total) by mouth every 7 (seven) days.          Objective:   Physical Exam BP 128/70 (BP Location: Left Arm, Patient Position: Sitting, Cuff Size: Small)   Pulse 86   Temp 98.1 F (36.7 C) (Oral)   Resp 14   Ht 5' (1.524 m)   Wt 133 lb (60.3 kg)   SpO2 94%   BMI 25.97 kg/m  General:   Well  developed, elderly lady, sitting in a wheelchair, in no distress.Marland Kitchen.  HEENT:  Normocephalic . Face symmetric, atraumatic Lungs:  CTA B Normal respiratory effort, no intercostal retractions, no accessory muscle use. Heart: RRR,  no murmur.  No pretibial edema bilaterally  Skin: Not pale. Not jaundice Neurologic:  alert & oriented X3.  Speech normal, gait not tested MSK: Knees with bony changes consistent with DJD but no swelling, effusion, redness or warmness Psych--  Cognition and judgment appear intact.  Cooperative with normal attention span and concentration.  Behavior appropriate. No anxious or depressed appearing.      Assessment & Plan:   Assessment   DM, no neuropathy per foot exam 01-2015 HTN Hyperlipidemia Chronic renal insufficiency, declined to see nephrology consistently Lower extremity pain:   w/u and neurology visit 08/2016, intolerant to gabapentin, better with nortriptyline Anemia DJD Latent syphilis? S/p Penicillin 1993  PLAN: Encephalopathy, felt to be due to a UTI. She is back to her baseline, no UTI sxs. S/p abx, rec observation. Will check a CBC and BMP as requested by the hospital team. stroke: She is following dysphagia 1 diet. Lower extremity pain: Currently relatively well controlled with nortriptyline. DJD: Has a specifically pain at the right knee, no evidence of septic arthritis on exam. Recommend ice, Tylenol, stop Aspercreme which is not helping, try diclofenac cream. Patient is frustrated because although she feels better she is unable to ambulate without help, she likes to go back to work!. Patient is counseled, she is an amazing lady, I hope she gets strong enough to be able to walk by herself..Marland Kitchen

## 2016-10-19 ENCOUNTER — Inpatient Hospital Stay (HOSPITAL_COMMUNITY)
Admission: EM | Admit: 2016-10-19 | Discharge: 2016-10-21 | DRG: 064 | Disposition: A | Discharge status: Hospice | Payer: Medicare Other | Attending: Internal Medicine | Admitting: Internal Medicine

## 2016-10-19 ENCOUNTER — Emergency Department (HOSPITAL_COMMUNITY): Payer: Medicare Other

## 2016-10-19 ENCOUNTER — Encounter (HOSPITAL_COMMUNITY): Payer: Self-pay | Admitting: Emergency Medicine

## 2016-10-19 ENCOUNTER — Telehealth: Payer: Self-pay

## 2016-10-19 ENCOUNTER — Telehealth: Payer: Self-pay | Admitting: Internal Medicine

## 2016-10-19 DIAGNOSIS — R29818 Other symptoms and signs involving the nervous system: Secondary | ICD-10-CM | POA: Diagnosis not present

## 2016-10-19 DIAGNOSIS — E1122 Type 2 diabetes mellitus with diabetic chronic kidney disease: Secondary | ICD-10-CM | POA: Diagnosis present

## 2016-10-19 DIAGNOSIS — R2990 Unspecified symptoms and signs involving the nervous system: Secondary | ICD-10-CM | POA: Diagnosis not present

## 2016-10-19 DIAGNOSIS — R5383 Other fatigue: Secondary | ICD-10-CM | POA: Diagnosis not present

## 2016-10-19 DIAGNOSIS — E1121 Type 2 diabetes mellitus with diabetic nephropathy: Secondary | ICD-10-CM | POA: Diagnosis not present

## 2016-10-19 DIAGNOSIS — G9340 Encephalopathy, unspecified: Secondary | ICD-10-CM | POA: Diagnosis not present

## 2016-10-19 DIAGNOSIS — Z66 Do not resuscitate: Secondary | ICD-10-CM | POA: Diagnosis not present

## 2016-10-19 DIAGNOSIS — R402412 Glasgow coma scale score 13-15, at arrival to emergency department: Secondary | ICD-10-CM | POA: Diagnosis present

## 2016-10-19 DIAGNOSIS — E860 Dehydration: Secondary | ICD-10-CM | POA: Diagnosis not present

## 2016-10-19 DIAGNOSIS — I10 Essential (primary) hypertension: Secondary | ICD-10-CM | POA: Diagnosis not present

## 2016-10-19 DIAGNOSIS — N184 Chronic kidney disease, stage 4 (severe): Secondary | ICD-10-CM | POA: Diagnosis present

## 2016-10-19 DIAGNOSIS — Z79899 Other long term (current) drug therapy: Secondary | ICD-10-CM

## 2016-10-19 DIAGNOSIS — Z833 Family history of diabetes mellitus: Secondary | ICD-10-CM

## 2016-10-19 DIAGNOSIS — N3000 Acute cystitis without hematuria: Secondary | ICD-10-CM | POA: Diagnosis not present

## 2016-10-19 DIAGNOSIS — I615 Nontraumatic intracerebral hemorrhage, intraventricular: Secondary | ICD-10-CM | POA: Diagnosis not present

## 2016-10-19 DIAGNOSIS — R29711 NIHSS score 11: Secondary | ICD-10-CM | POA: Diagnosis present

## 2016-10-19 DIAGNOSIS — I129 Hypertensive chronic kidney disease with stage 1 through stage 4 chronic kidney disease, or unspecified chronic kidney disease: Secondary | ICD-10-CM | POA: Diagnosis present

## 2016-10-19 DIAGNOSIS — Z6825 Body mass index (BMI) 25.0-25.9, adult: Secondary | ICD-10-CM

## 2016-10-19 DIAGNOSIS — R4182 Altered mental status, unspecified: Secondary | ICD-10-CM

## 2016-10-19 DIAGNOSIS — E119 Type 2 diabetes mellitus without complications: Secondary | ICD-10-CM

## 2016-10-19 DIAGNOSIS — N179 Acute kidney failure, unspecified: Secondary | ICD-10-CM | POA: Diagnosis not present

## 2016-10-19 DIAGNOSIS — E785 Hyperlipidemia, unspecified: Secondary | ICD-10-CM | POA: Diagnosis present

## 2016-10-19 DIAGNOSIS — E854 Organ-limited amyloidosis: Secondary | ICD-10-CM | POA: Diagnosis present

## 2016-10-19 DIAGNOSIS — E875 Hyperkalemia: Secondary | ICD-10-CM | POA: Diagnosis present

## 2016-10-19 DIAGNOSIS — I680 Cerebral amyloid angiopathy: Secondary | ICD-10-CM | POA: Diagnosis present

## 2016-10-19 DIAGNOSIS — F329 Major depressive disorder, single episode, unspecified: Secondary | ICD-10-CM | POA: Diagnosis present

## 2016-10-19 DIAGNOSIS — Z87891 Personal history of nicotine dependence: Secondary | ICD-10-CM | POA: Diagnosis not present

## 2016-10-19 DIAGNOSIS — I639 Cerebral infarction, unspecified: Secondary | ICD-10-CM

## 2016-10-19 DIAGNOSIS — H9190 Unspecified hearing loss, unspecified ear: Secondary | ICD-10-CM | POA: Diagnosis present

## 2016-10-19 DIAGNOSIS — R9401 Abnormal electroencephalogram [EEG]: Secondary | ICD-10-CM | POA: Diagnosis present

## 2016-10-19 DIAGNOSIS — E871 Hypo-osmolality and hyponatremia: Secondary | ICD-10-CM | POA: Diagnosis present

## 2016-10-19 DIAGNOSIS — Z905 Acquired absence of kidney: Secondary | ICD-10-CM

## 2016-10-19 DIAGNOSIS — Z888 Allergy status to other drugs, medicaments and biological substances status: Secondary | ICD-10-CM

## 2016-10-19 DIAGNOSIS — R64 Cachexia: Secondary | ICD-10-CM | POA: Diagnosis present

## 2016-10-19 DIAGNOSIS — Z515 Encounter for palliative care: Secondary | ICD-10-CM | POA: Diagnosis present

## 2016-10-19 DIAGNOSIS — Z87440 Personal history of urinary (tract) infections: Secondary | ICD-10-CM

## 2016-10-19 LAB — I-STAT CHEM 8, ED
BUN: 29 mg/dL — ABNORMAL HIGH (ref 6–20)
CHLORIDE: 100 mmol/L — AB (ref 101–111)
Calcium, Ion: 1.16 mmol/L (ref 1.15–1.40)
Creatinine, Ser: 1.7 mg/dL — ABNORMAL HIGH (ref 0.44–1.00)
Glucose, Bld: 155 mg/dL — ABNORMAL HIGH (ref 65–99)
HEMATOCRIT: 33 % — AB (ref 36.0–46.0)
HEMOGLOBIN: 11.2 g/dL — AB (ref 12.0–15.0)
POTASSIUM: 6 mmol/L — AB (ref 3.5–5.1)
SODIUM: 131 mmol/L — AB (ref 135–145)
TCO2: 27 mmol/L (ref 0–100)

## 2016-10-19 LAB — COMPREHENSIVE METABOLIC PANEL
ALT: 10 U/L — ABNORMAL LOW (ref 14–54)
ANION GAP: 6 (ref 5–15)
AST: 14 U/L — ABNORMAL LOW (ref 15–41)
Albumin: 2.9 g/dL — ABNORMAL LOW (ref 3.5–5.0)
Alkaline Phosphatase: 67 U/L (ref 38–126)
BUN: 18 mg/dL (ref 6–20)
CALCIUM: 9.3 mg/dL (ref 8.9–10.3)
CHLORIDE: 101 mmol/L (ref 101–111)
CO2: 24 mmol/L (ref 22–32)
Creatinine, Ser: 1.73 mg/dL — ABNORMAL HIGH (ref 0.44–1.00)
GFR calc non Af Amer: 25 mL/min — ABNORMAL LOW (ref >60)
GFR, EST AFRICAN AMERICAN: 29 mL/min — AB (ref >60)
Glucose, Bld: 156 mg/dL — ABNORMAL HIGH (ref 65–99)
Potassium: 4.6 mmol/L (ref 3.5–5.1)
SODIUM: 131 mmol/L — AB (ref 135–145)
Total Bilirubin: 0.7 mg/dL (ref 0.3–1.2)
Total Protein: 6.1 g/dL — ABNORMAL LOW (ref 6.5–8.1)

## 2016-10-19 LAB — CBC WITH DIFFERENTIAL/PLATELET
BASOS PCT: 0 %
Basophils Absolute: 0 10*3/uL (ref 0.0–0.1)
Eosinophils Absolute: 0.2 10*3/uL (ref 0.0–0.7)
Eosinophils Relative: 3 %
HEMATOCRIT: 32.6 % — AB (ref 36.0–46.0)
HEMOGLOBIN: 10.5 g/dL — AB (ref 12.0–15.0)
Lymphocytes Relative: 29 %
Lymphs Abs: 1.6 10*3/uL (ref 0.7–4.0)
MCH: 27.1 pg (ref 26.0–34.0)
MCHC: 32.2 g/dL (ref 30.0–36.0)
MCV: 84 fL (ref 78.0–100.0)
MONOS PCT: 8 %
Monocytes Absolute: 0.4 10*3/uL (ref 0.1–1.0)
NEUTROS ABS: 3.3 10*3/uL (ref 1.7–7.7)
NEUTROS PCT: 60 %
Platelets: 301 10*3/uL (ref 150–400)
RBC: 3.88 MIL/uL (ref 3.87–5.11)
RDW: 13.5 % (ref 11.5–15.5)
WBC: 5.5 10*3/uL (ref 4.0–10.5)

## 2016-10-19 LAB — BASIC METABOLIC PANEL
Anion gap: 7 (ref 5–15)
BUN: 15 mg/dL (ref 6–20)
CHLORIDE: 102 mmol/L (ref 101–111)
CO2: 25 mmol/L (ref 22–32)
CREATININE: 1.4 mg/dL — AB (ref 0.44–1.00)
Calcium: 9.9 mg/dL (ref 8.9–10.3)
GFR, EST AFRICAN AMERICAN: 37 mL/min — AB (ref >60)
GFR, EST NON AFRICAN AMERICAN: 32 mL/min — AB (ref >60)
Glucose, Bld: 113 mg/dL — ABNORMAL HIGH (ref 65–99)
Potassium: 5 mmol/L (ref 3.5–5.1)
SODIUM: 134 mmol/L — AB (ref 135–145)

## 2016-10-19 LAB — I-STAT TROPONIN, ED: Troponin i, poc: 0 ng/mL (ref 0.00–0.08)

## 2016-10-19 LAB — GLUCOSE, CAPILLARY
GLUCOSE-CAPILLARY: 124 mg/dL — AB (ref 65–99)
GLUCOSE-CAPILLARY: 107 mg/dL — AB (ref 65–99)
Glucose-Capillary: 108 mg/dL — ABNORMAL HIGH (ref 65–99)

## 2016-10-19 LAB — URINALYSIS, ROUTINE W REFLEX MICROSCOPIC
Bilirubin Urine: NEGATIVE
Glucose, UA: 50 mg/dL — AB
Hgb urine dipstick: NEGATIVE
Ketones, ur: NEGATIVE mg/dL
Nitrite: NEGATIVE
PH: 6 (ref 5.0–8.0)
Protein, ur: 100 mg/dL — AB
SPECIFIC GRAVITY, URINE: 1.009 (ref 1.005–1.030)

## 2016-10-19 LAB — AMMONIA: AMMONIA: 47 umol/L — AB (ref 9–35)

## 2016-10-19 LAB — CBG MONITORING, ED: Glucose-Capillary: 143 mg/dL — ABNORMAL HIGH (ref 65–99)

## 2016-10-19 LAB — ETHANOL

## 2016-10-19 MED ORDER — HEPARIN SODIUM (PORCINE) 5000 UNIT/ML IJ SOLN
5000.0000 [IU] | Freq: Three times a day (TID) | INTRAMUSCULAR | Status: DC
  Administered 2016-10-19 – 2016-10-21 (×6): 5000 [IU] via SUBCUTANEOUS
  Filled 2016-10-19 (×6): qty 1

## 2016-10-19 MED ORDER — SODIUM CHLORIDE 0.9 % IV BOLUS (SEPSIS)
500.0000 mL | Freq: Once | INTRAVENOUS | Status: AC
  Administered 2016-10-19: 500 mL via INTRAVENOUS

## 2016-10-19 MED ORDER — ACETAMINOPHEN 650 MG RE SUPP
650.0000 mg | Freq: Four times a day (QID) | RECTAL | Status: DC | PRN
  Administered 2016-10-21: 650 mg via RECTAL
  Filled 2016-10-19: qty 1

## 2016-10-19 MED ORDER — ACETAMINOPHEN 325 MG PO TABS: 650.0000 mg | ORAL_TABLET | Freq: Four times a day (QID) | ORAL | Status: DC | PRN

## 2016-10-19 MED ORDER — STROKE: EARLY STAGES OF RECOVERY BOOK
Freq: Once | Status: AC
  Administered 2016-10-19: 14:00:00

## 2016-10-19 MED ORDER — ASPIRIN EC 81 MG PO TBEC: 81.0000 mg | DELAYED_RELEASE_TABLET | Freq: Every day | ORAL | Status: DC

## 2016-10-19 MED ORDER — NORTRIPTYLINE HCL 10 MG PO CAPS
10.0000 mg | ORAL_CAPSULE | Freq: Every day | ORAL | Status: DC
  Filled 2016-10-19: qty 1

## 2016-10-19 MED ORDER — DEXTROSE 5 % IV SOLN
1.0000 g | INTRAVENOUS | Status: DC
  Administered 2016-10-19: 1 g via INTRAVENOUS
  Filled 2016-10-19 (×2): qty 10

## 2016-10-19 MED ORDER — INSULIN ASPART 100 UNIT/ML ~~LOC~~ SOLN: 3.0000 [IU] | Freq: Once | SUBCUTANEOUS | Status: DC

## 2016-10-19 MED ORDER — DICLOFENAC SODIUM 3 % EX CREA: 2.0000 g | TOPICAL_CREAM | Freq: Two times a day (BID) | CUTANEOUS | Status: DC | PRN

## 2016-10-19 MED ORDER — INSULIN ASPART 100 UNIT/ML ~~LOC~~ SOLN
0.0000 [IU] | Freq: Every day | SUBCUTANEOUS | Status: DC
  Administered 2016-10-20: 3 [IU] via SUBCUTANEOUS

## 2016-10-19 MED ORDER — HYDRALAZINE HCL 20 MG/ML IJ SOLN
5.0000 mg | INTRAMUSCULAR | Status: DC | PRN
  Administered 2016-10-20: 5 mg via INTRAVENOUS
  Filled 2016-10-19: qty 1

## 2016-10-19 MED ORDER — SODIUM CHLORIDE 0.9 % IV SOLN
INTRAVENOUS | Status: DC
  Administered 2016-10-19: 14:00:00 via INTRAVENOUS

## 2016-10-19 MED ORDER — ACETAMINOPHEN 500 MG PO TABS: 1000.0000 mg | ORAL_TABLET | Freq: Four times a day (QID) | ORAL | Status: DC | PRN

## 2016-10-19 MED ORDER — ONDANSETRON HCL 4 MG/2ML IJ SOLN
4.0000 mg | Freq: Four times a day (QID) | INTRAMUSCULAR | Status: DC | PRN
  Administered 2016-10-20: 4 mg via INTRAVENOUS
  Filled 2016-10-19: qty 2

## 2016-10-19 MED ORDER — SODIUM CHLORIDE 0.9 % IV SOLN
1.0000 g | Freq: Once | INTRAVENOUS | Status: AC
  Administered 2016-10-19: 1 g via INTRAVENOUS
  Filled 2016-10-19: qty 10

## 2016-10-19 MED ORDER — ONDANSETRON HCL 4 MG PO TABS
4.0000 mg | ORAL_TABLET | Freq: Four times a day (QID) | ORAL | Status: DC | PRN
Start: 2016-10-19 — End: 2016-10-21

## 2016-10-19 MED ORDER — INSULIN ASPART 100 UNIT/ML ~~LOC~~ SOLN
0.0000 [IU] | Freq: Three times a day (TID) | SUBCUTANEOUS | Status: DC
  Administered 2016-10-20: 5 [IU] via SUBCUTANEOUS
  Administered 2016-10-21: 2 [IU] via SUBCUTANEOUS

## 2016-10-19 MED ORDER — INSULIN ASPART 100 UNIT/ML IV SOLN
3.0000 [IU] | Freq: Once | INTRAVENOUS | Status: DC
  Filled 2016-10-19: qty 0.03

## 2016-10-19 NOTE — ED Notes (Signed)
Attempted to call report to 5M x1 °

## 2016-10-19 NOTE — Telephone Encounter (Signed)
Caller name:[name] Relation to ZO:XWRUEAVWpt:daughter Call back number:l [phone]    Reason for call:  Daughter stated PCP advised if [facility] notify, please advise

## 2016-10-19 NOTE — ED Notes (Signed)
Attempted report to 5M x 1 

## 2016-10-19 NOTE — H&P (Signed)
History and Physical    Jeanne Barker ZOX:[id number] DOB: 1924-03-30 DOA: 10/19/2016  PCP: [name], MD Patient coming from: home  Chief Complaint: slurred speech and lethargy  HPI: Jeanne Barker is a 83 y.o. female with medical history significant of recurrent UTIs, anemia, C KD, diabetes, deafness, hypertension. She was in her normal state of health despite recent treatment for UTIs, until onset of symptoms at approximately 7:30 AM when patient developed slurring of her speech and general weakness and lethargy. Patient endorses associated inability to hear despite concurrent use with her cochlear implant which appears to be working per family. Patient is normally very interactive and clear from a cognitive standpoint. There are no recently reported fevers, chest pain, shortness breath, palpitations, abdominal pain, dysuria, fevers, flank pain. At baseline patient is able to ambulate though requires assistance from family and a walker. There are no described motor deficits by family.  ED Course: Objective findings outlined below. Stroke team initially consult and code stroke was canceled. Patient unable to get MRI.  Review of Systems: As per HPI otherwise all other systems reviewed and are negative  Ambulatory Status: Requires regular systems by family members but able to ambulate with a walker often times by herself.  Past Medical History:  Diagnosis Date  . Anemia   . Chronic renal insufficiency   . Diabetes mellitus   . Hard of hearing   . Hyperlipidemia   . Hypertension   . Osteoarthritis   . Syphilis    ?latent?, treated c/ PCN 1993    Past Surgical History:  Procedure Laterality Date  . ABDOMINAL HYSTERECTOMY     BSO  . CATARACT EXTRACTION    . cochlear impalnt  1/02  . NEPHRECTOMY     left  . OOPHORECTOMY      Social History   Social History  . Marital status: Single    Spouse name: N/A  . Number of children: 7  . Years of education: 3rd grade    Occupational History  . PT job   .  Retired   Social History Main Topics  . Smoking status: Former Games developer  . Smokeless tobacco: Never Used  . Alcohol use No  . Drug use: No  . Sexual activity: No   Other Topics Concern  . Not on file   Social History Narrative   Patient still lives at home but someone is always with her.  Has 7 children.  Retired from [employer] officer.  Education: 3rd grade.    Her daughter Kirt Boys [phone])  lives 20 minutes away and usually comes w/ the pt to the office visit    Allergies  Allergen Reactions  . Amlodipine Besylate Nausea Only  . Lisinopril Other (See Comments)    REACTION: hyperkalemia    Family History  Problem Relation Age of Onset  . Other Brother   . Diabetes Mellitus II Brother   . CAD Neg Hx       Prior to Admission medications   Medication Sig Start Date End Date Taking? Authorizing Provider  acetaminophen (TYLENOL) 500 MG tablet Take 500 mg by mouth every 6 (six) hours as needed for mild pain.     [provider]  aspirin 81 MG tablet Take 81 mg by mouth daily.      [provider]  cholecalciferol (VITAMIN D) 1000 units tablet Take 1,000 Units by mouth daily.    [provider]  cloNIDine (CATAPRES) 0.1 MG tablet Take 1 tablet (  0.1 mg total) by mouth 2 (two) times daily. 07/19/16   Wanda PlumpPaz, Jose E, MD  Diclofenac Sodium 3 % CREA Apply 2 g topically 2 (two) times daily as needed. 10/18/16   Wanda PlumpPaz, Jose E, MD  nortriptyline (PAMELOR) 10 MG capsule Take 1 capsule (10 mg total) by mouth at bedtime. 09/12/16   Patel, Donika K, DO  sitaGLIPtin (JANUVIA) 50 MG tablet Take 1 tablet (50 mg total) by mouth daily. 06/20/16   Wanda PlumpPaz, Jose E, MD  vitamin B-12 (CYANOCOBALAMIN) 100 MCG tablet Take 100 mcg by mouth daily.     [provider]  Vitamin D, Ergocalciferol, (DRISDOL) 50000 units CAPS capsule Take 1 capsule (50,000 Units total) by mouth every 7 (seven) days. Patient taking differently: Take 50,000 Units  by mouth every Sunday.  08/29/16   Wanda PlumpPaz, Jose E, MD    Physical Exam: Vitals:   10/19/16 0945 10/19/16 1000 10/19/16 1030 10/19/16 1045  BP: (!) 183/74 (!) 184/72 (!) 189/80 (!) 186/77  Pulse: 82 85 82 89  Resp: (!) 22 16 14  (!) 23  Temp:      TempSrc:      SpO2: 100% 98% 98% 99%  Weight:      Height:         General: Elderly, frail and cachectic appearing. Eyes:  PERRL, EOMI, normal lids, iris ENT: Edentulous, moist mucous membranes Neck:  no LAD, masses or thyromegaly Cardiovascular: 2/6 systolic murmur RRR, No LE edema.  Respiratory:  CTA bilaterally, no w/r/r. Normal respiratory effort. Abdomen:  soft, ntnd, NABS Skin:  no rash or induration seen on limited exam Musculoskeletal:  grossly normal tone BUE/BLE, good ROM, no bony abnormality Psychiatric:  grossly normal mood and affect, speech fluent and appropriate, AOx3 Neurologic:  CN 2-12 grossly intact, moves all extremities in coordinated fashion, sensation intact, grip strength and arm flexion 4 out of 5 bilaterally. Leg flexion 4 out of 5 bilaterally.  Labs on Admission: I have personally reviewed following labs and imaging studies  CBC:  Recent Labs Lab 10/18/16 1204 10/19/16 0827 10/19/16 0833  WBC 5.7 5.5  --   NEUTROABS 3.4 3.3  --   HGB 10.6* 10.5* 11.2*  HCT 32.3* 32.6* 33.0*  MCV 83.1 84.0  --   PLT 369.0 301  --    Basic Metabolic Panel:  Recent Labs Lab 10/18/16 1204 10/19/16 0827 10/19/16 0833  NA 128* 131* 131*  K 5.0 4.6 6.0*  CL 96 101 100*  CO2 26 24  --   GLUCOSE 151* 156* 155*  BUN 17 18 29*  CREATININE 1.41* 1.73* 1.70*  CALCIUM 9.6 9.3  --    GFR: Estimated Creatinine Clearance: 17.4 mL/min (A) (by C-G formula based on SCr of 1.7 mg/dL (H)). Liver Function Tests:  Recent Labs Lab 10/19/16 0827  AST 14*  ALT 10*  ALKPHOS 67  BILITOT 0.7  PROT 6.1*  ALBUMIN 2.9*   No results for input(s): LIPASE, AMYLASE in the last 168 hours.  Recent Labs Lab 10/19/16 0827  AMMONIA  47*   Coagulation Profile: No results for input(s): INR, PROTIME in the last 168 hours. Cardiac Enzymes: No results for input(s): CKTOTAL, CKMB, CKMBINDEX, TROPONINI in the last 168 hours. BNP (last 3 results) No results for input(s): PROBNP in the last 8760 hours. HbA1C: No results for input(s): HGBA1C in the last 72 hours. CBG:  Recent Labs Lab 10/19/16 0819  GLUCAP 143*   Lipid Profile: No results for input(s): CHOL, HDL, LDLCALC, TRIG, CHOLHDL, LDLDIRECT  in the last 72 hours. Thyroid Function Tests: No results for input(s): TSH, T4TOTAL, FREET4, T3FREE, THYROIDAB in the last 72 hours. Anemia Panel: No results for input(s): VITAMINB12, FOLATE, FERRITIN, TIBC, IRON, RETICCTPCT in the last 72 hours. Urine analysis:    Component Value Date/Time   COLORURINE YELLOW 10/19/2016 0836   APPEARANCEUR CLEAR 10/19/2016 0836   LABSPEC 1.009 10/19/2016 0836   PHURINE 6.0 10/19/2016 0836   GLUCOSEU 50 (A) 10/19/2016 0836   HGBUR NEGATIVE 10/19/2016 0836   BILIRUBINUR NEGATIVE 10/19/2016 0836   KETONESUR NEGATIVE 10/19/2016 0836   PROTEINUR 100 (A) 10/19/2016 0836   UROBILINOGEN 0.2 06/18/2014 2200   NITRITE NEGATIVE 10/19/2016 0836   LEUKOCYTESUR TRACE (A) 10/19/2016 0836    Creatinine Clearance: Estimated Creatinine Clearance: 17.4 mL/min (A) (by C-G formula based on SCr of 1.7 mg/dL (H)).  Sepsis Labs: @LABRCNTIP (procalcitonin:4,lacticidven:4) )No results found for this or any previous visit (from the past 240 hour(s)).   Radiological Exams on Admission: Dg Chest Port 1 View  Result Date: 10/19/2016 CLINICAL DATA:  Code stroke,left sided weakness, confusion and slurred speech. Hx htn,dm EXAM: PORTABLE CHEST - 1 VIEW COMPARISON:  09/30/2016 FINDINGS: Relatively low lung volumes with mild interstitial prominence as before. No confluent airspace disease. Heart size and mediastinal contours are within normal limits. No effusion. Visualized bones unremarkable. IMPRESSION: Low  volumes.  No acute disease. Electronically Signed   By: Corlis Leak M.D.   On: 10/19/2016 09:39   Ct Head Code Stroke Wo Contrast`  Addendum Date: 10/19/2016   ADDENDUM REPORT: 10/19/2016 08:30 ADDENDUM: These results were called by telephone at the time of interpretation on 10/19/2016 at 8:28 am to Dr. Wilford Corner, who verbally acknowledged these results. Electronically Signed   By: Marin Roberts M.D.   On: 10/19/2016 08:30   Result Date: 10/19/2016 CLINICAL DATA:  Code stroke. Sudden onset of confusion. Unable to walk. EXAM: CT HEAD WITHOUT CONTRAST TECHNIQUE: Contiguous axial images were obtained from the base of the skull through the vertex without intravenous contrast. COMPARISON:  CT head without contrast 10/02/2016. FINDINGS: Brain: Significant streak artifact from the cochlear implant is again noted. No acute infarct, hemorrhage, or mass lesion is present. Basal ganglia are unremarkable. The insular ribbon is normal bilaterally. The ventricles are proportionate to the degree of atrophy. Mild white matter changes are stable. Vascular: Atherosclerotic calcifications are again noted within the cavernous internal carotid artery's bilaterally. There is no hyperdense vessel. Skull: The right mastoidectomy is noted. The calvarium is otherwise intact. No focal lytic or blastic lesions are present. The skullbase is within normal limits. Sinuses/Orbits: The paranasal sinuses clear. Bilateral lens replacements noted. The globes and orbits are otherwise unremarkable. ASPECTS [facility] Stroke Program Early CT Score) - Ganglionic level infarction (caudate, lentiform nuclei, internal capsule, insula, M1-M3 cortex): 7/7 - Supraganglionic infarction (M4-M6 cortex): 3/3 Total score (0-10 with 10 being normal): 10/10 IMPRESSION: 1. No acute intracranial abnormality or significant interval change. 2. Stable atrophy white matter disease. This likely reflects the sequela of chronic microvascular ischemia. 3. ASPECTS is 10/10  Electronically Signed: By: Marin Roberts M.D. On: 10/19/2016 08:27    EKG: Independently reviewed. Sinus. No ACS.  Assessment/Plan Active Problems:   DMII (diabetes mellitus, type 2) (HCC)   CKD (chronic kidney disease) stage 4, GFR 15-29 ml/min (HCC)   Essential hypertension   Encephalopathy   Dehydration, mild   Acute hyponatremia   Acute cystitis without hematuria   Stroke-like symptoms   Strokelike symptoms: Stroke/TIA vs infectious encephalopathy. Neuro following  and appreciate their assistance. Patient unable to obtain MRI due to cochlear implant. Patient is not a candidate for aggressive intervention such as TPA. Patient with recent similar presenting symptoms which are felt to be due to a UTI. CT head without evidence of acute infarction. - Treatment of presumed UTI as below - Neuro checks, telemetry - Further workup per neurology - continue ASA - allow permissive HTN  Recurrent UTIs: UA with 6-30 WBC, rare bacteria, 100 protein, trace leukocytes and 0-5 rbc's. Last urine culture from 10/01/2016 showing greater than 100,000 colonies of enterococcus that was pansensitive. Favor treatment over way for watching at this time given patient's history - Urine culture - Ceftriaxone - IVF  CKD: Cr 1.7. Baseline 1.4. Slight bump likely from dehydration - IVF - BMP in am  Deafness: cochlear implant appears to be malfunctioning - Family attempting to troubleshoot cochlear implant device  HyperK: 6.0. No EKG changes - IVF - BMP at 19:00 and in am  DM: - SSI  Hypertension: -Base hold clonidine and until the a.m. on 10/20/2016 to allow for permissive hypertension -Base hydralazine when necessary  Depression: - continue Pamelor   DVT prophylaxis: hep  Code Status: full  Family Communication: daughter  Disposition Plan: pending improvement  Consults called: Neuro Admission status: obs    MERRELL, DAVID J MD [facility]  If 7PM-7AM, please contact  night-coverage [email or web address] Password [email or web address]  10/19/2016, 11:15 AM

## 2016-10-19 NOTE — Telephone Encounter (Signed)
thx

## 2016-10-19 NOTE — Progress Notes (Signed)
Code stroke called at 0801.  [facility] ED via [facility] vehicle at [phone].  LSN is unknown, NIHSS 11.  As per daughter, patient got up to eat breakfast , daughter left to go get mail and when she came back in noticed patient was having trouble with speech.  Patient is very hard of hearing.

## 2016-10-19 NOTE — ED Triage Notes (Signed)
Pt arrives via POV with daughter who states they had gotten pt up to eat breakfast, she went to get mail and pt had turned over breakfast and was babbling. Daughter states last seen normal was about 7 am . pts other daughter

## 2016-10-19 NOTE — Assessment & Plan Note (Signed)
Encephalopathy, felt to be due to a UTI. She is back to her baseline, no UTI sxs. S/p abx, rec observation. Will check a CBC and BMP as requested by the hospital team. stroke: She is following dysphagia 1 diet. Lower extremity pain: Currently relatively well controlled with nortriptyline. DJD: Has a specifically pain at the right knee, no evidence of septic arthritis on exam. Recommend ice, Tylenol, stop Aspercreme which is not helping, try diclofenac cream. Patient is frustrated because although she feels better she is unable to ambulate without help, she likes to go back to work!. Patient is counseled, she is an amazing lady, I hope she gets strong enough to be able to walk by herself..Marland Kitchen

## 2016-10-19 NOTE — Progress Notes (Signed)
Pharmacy Antibiotic Note  Jeanne RammingRuth B Barker is a 83 y.o. female admitted on 10/19/2016 with slurred speech and lethargy. CT negative for acute infarction.  Hx recurrent UTIs.  Pharmacy has been consulted for Ceftriaxone dosing.  Plan:  Ceftriaxone 1gm IV q24hrs.  No adjustment needed for renal function.  Will follow up culture results.  Height: 5' (152.4 cm) Weight: 130 lb 8.2 oz (59.2 kg) IBW/kg (Calculated) : 45.5  Temp (24hrs), Avg:98.2 F (36.8 C), Min:98.2 F (36.8 C), Max:98.2 F (36.8 C)   Recent Labs Lab 10/18/16 1204 10/19/16 0827 10/19/16 0833  WBC 5.7 5.5  --   CREATININE 1.41* 1.73* 1.70*    Estimated Creatinine Clearance: 17.4 mL/min (A) (by C-G formula based on SCr of 1.7 mg/dL (H)).    Allergies  Allergen Reactions  . Amlodipine Besylate Nausea Only  . Lisinopril Other (See Comments)    REACTION: hyperkalemia    Antimicrobials this admission:  Cetriaxone 7/11>>  Dose adjustments this admission:  n/a  Microbiology results:  7/11 urine -  Thank you for allowing pharmacy to be a part of this patient's care.  Dennie Fettersgan, Diquan Kassis [place], [place] Pager: [phone] 10/19/2016 11:30 AM

## 2016-10-19 NOTE — Consult Note (Addendum)
Requesting Physician: Dr. Corlis Leak    Chief Complaint: Code Stroke  History obtained from:  Patient's family and chart  HPI:                                                                                                                                      Jeanne Barker is an 83 y.o. female with a PMH of HTN, DM2, renal insufficiency, deaf with cochlear implant who presented to [facility] with left sided weakness, confusion and slurred speech. One daughter put her to bed and said she woke up "fine", around 7am; however, the daughter at bedside stated that she found her mother at the breakfast table slurring her speech with a knocked-over glass, which is uncharacteristic for her.  Notably, Ms. Bankert had an outpatient visit yesterday for UTI symptoms. Chart review suggests that she has presented to the ED with the same symptoms with multiple metabolic conditions (UTI, hyperglycemia).  At baseline, Ms. Persley has clear speech and is able to get herself up from a chair and walk with a walker. She can feed herself but is unable to bathe herself.  Date last known well: Date: 10/18/2016 Time last known well: Unable to determine  tPA Given: No: Likely metabolic etiology  Modified Rankin Score: 4  Stroke Risk Factors - diabetes mellitus, hyperlipidemia and hypertension   Past Medical History:  Diagnosis Date  . Anemia   . Chronic renal insufficiency   . Diabetes mellitus   . Hard of hearing   . Hyperlipidemia   . Hypertension   . Osteoarthritis   . Syphilis    ?latent?, treated c/ PCN 1993    Past Surgical History:  Procedure Laterality Date  . ABDOMINAL HYSTERECTOMY     BSO  . CATARACT EXTRACTION    . cochlear impalnt  1/02  . NEPHRECTOMY     left  . OOPHORECTOMY      Family History  Problem Relation Age of Onset  . Other Brother   . Diabetes Mellitus II Brother   . CAD Neg Hx      reports that she has quit smoking. She has never used smokeless tobacco. She reports that  she does not drink alcohol or use drugs.  Allergies  Allergen Reactions  . Amlodipine Besylate Nausea Only  . Lisinopril Other (See Comments)    REACTION: hyperkalemia    Medications:  No outpatient prescriptions have been marked as taking for the 10/19/16 encounter [facility](Hospital Encounter).    Review Of Systems:                                                                                                           History obtained from unobtainable from patient due to mental status  Blood pressure 140/82, pulse 85, temperature 98.2 F (36.8 C), temperature source Oral, resp. rate 20, height 5' (1.524 m), weight 59.2 kg (130 lb 8.2 oz), SpO2 100 %.   Physical Examination:                                                                                                      General: WDWN female.  HEENT:  Normocephalic, no lesions, without obvious abnormality.  Normal external eye and conjunctiva.  Normal TM's bilaterally.  Normal auditory canals and external ears. Normal external nose, mucus membranes and septum.  Normal pharynx. Cardiovascular: S1, S2 normal, pulses palpable throughout   Pulmonary: chest clear, no wheezing, rales, normal symmetric air entry Abdomen: soft Extremities: no joint deformities, effusion, or inflammation Musculoskeletal: no joint tenderness, deformity or swelling Tone and bulk:normal tone throughout; no atrophy noted Skin: warm and dry, no hyperpigmentation, vitiligo, or suspicious lesions  Neurological Examination:                                                                                               Mental Status: Jeanne Barker is alert. Unable to assess orientation.  Speech dysarthric. Able to follow some simple commands when they can be mimicked. Cranial Nerves: II: Visual fields grossly normal, pupils are equal, round, reactive to  light III,IV, VI: Ptosis not present, extra-ocular muscle grossly movements V,VII: Face is symmetric VIII: Deaf IX,X: Unable to assess XI: Unable to assess XII: Midline tongue extension Motor: Able to move all extremities anti-gravity. Left weaker than right. Some drift left arm. Sensory: Unable to assess Deep Tendon Reflexes: 1+ and symmetric throughout BUE; dropped BLE Plantars: Right: downgoing   Left: downgoing Cerebellar: She does not perform Proprioception: Unable to assess Gait: Not tested  Lab Results: Basic Metabolic Panel:  Recent Labs Lab 10/18/16 1204  NA 128*  K 5.0  CL 96  CO2 26  GLUCOSE 151*  BUN 17  CREATININE 1.41*  CALCIUM 9.6    Liver Function Tests: No results for input(s): AST, ALT, ALKPHOS, BILITOT, PROT, ALBUMIN in the last 168 hours. No results for input(s): LIPASE, AMYLASE in the last 168 hours. No results for input(s): AMMONIA in the last 168 hours.  CBC:  Recent Labs Lab 10/18/16 1204  WBC 5.7  NEUTROABS 3.4  HGB 10.6*  HCT 32.3*  MCV 83.1  PLT 369.0    Cardiac Enzymes: No results for input(s): CKTOTAL, CKMB, CKMBINDEX, TROPONINI in the last 168 hours.  Lipid Panel: No results for input(s): CHOL, TRIG, HDL, CHOLHDL, VLDL, LDLCALC in the last 168 hours.  CBG:  Recent Labs Lab 10/19/16 0819  GLUCAP 143*    Microbiology: Results for orders placed or performed during the hospital encounter of 09/30/16  Urine Culture     Status: Abnormal   Collection Time: 10/01/16  2:55 PM  Result Value Ref Range Status   Specimen Description URINE, RANDOM  Final   Special Requests NONE  Final   Culture >=100,000 COLONIES/mL ENTEROCOCCUS FAECALIS (A)  Final   Report Status 10/03/2016 FINAL  Final   Organism ID, Bacteria ENTEROCOCCUS FAECALIS (A)  Final      Susceptibility   Enterococcus faecalis - MIC*    AMPICILLIN <=2 SENSITIVE Sensitive     LEVOFLOXACIN 1 SENSITIVE Sensitive     NITROFURANTOIN <=16 SENSITIVE Sensitive      VANCOMYCIN 1 SENSITIVE Sensitive     * >=100,000 COLONIES/mL ENTEROCOCCUS FAECALIS    Coagulation Studies: No results for input(s): LABPROT, INR in the last 72 hours.  Imaging: No results found.   Assessment and plan per attending neurologist.  Leonel Ramsay PA-C [facility]   10/19/2016, 8:24 AM Neuro hospitalist addendum Patient seen and examined in the emergency room, she was brought in for evaluation of acute stroke. Briefly, 83 year old female with a history of hypertension diabetes renal insufficiency, deafness or the cochlear implant presented to the ED with confusion and slurred speech and possibly left-sided weakness. Her last seen normal was unclear according to her daughters with the best time that was definitive last on was somewhere around last night. She has had multiple similar episodes in the past with a UTI which leads her to become confused, slurred speech and left-sided weakness. She was seen in this ER a few days ago by neurology as acute code stroke for similar complaints.  On examination, frail woman in no acute distress, normocephalic/atraumatic. Normal S1-S2 Normal chest auscultation Normal abdomen Neurological exam Severe  dysarthric speech. Follow some simple commands and mimics some. Cranial nerves: Pupils equal round reactive to light, visual fields full, extraocular movements grossly intact, face is grossly symmetric but she is edentulous so that makes that assessment difficult, tongue midline. Motor exam: Mild drift on the left arm, all other extremities antigravity without drift. Sensory exam: Did not really cooperative with this exam. Downgoing plantars, symmetric 1+ DTRs bilaterally. Did not cooperate with formal cognition testing but correlation seemed intact placed on grasp for objects.  Imaging - CTH - no acute changes  Assessment Patient presents with strokelike symptoms, this could be a stroke but due to unclear last seen normal  she was not a candidate for TPA. She had no cortical signs pointing to a large vessel occlusion, hence was not a candidate for endovascular treatment. Her symptoms are similar to the prior symptoms that happened in the setting of UTI so considering toxic metabolic encephalopathy would be the first  differential. Definitive diagnosis of stroke can only be made with an MRI but because of her cochlear implant he cannot get an MRI at this time. If her symptoms do not improve with treatment of the UTI and persist, consider repeating a CT head.. Patient should be on an antiplatelet and statin for stroke prevention. Please call us with questions as needed. No further neurological testing or intervention is indicated at this time from our standpoint.  -- Milon Dikes, MD [facility] [phone]  If 7pm to 7am, please call on call as listed on AMION.

## 2016-10-19 NOTE — Telephone Encounter (Signed)
PA initiated via Covermymeds; KEY: [id number]. Received real-time approval. PA approved through 10/19/2017.

## 2016-10-19 NOTE — ED Provider Notes (Signed)
[facility]-EMERGENCY DEPT Provider Note   CSN: [id number] Arrival date & time: 10/19/16  4098     History   Chief Complaint No chief complaint on file.   HPI Jeanne Barker is a 83 y.o. female.  HPI   Patient is a 83 year old female with history of deafness, hypertension hyperlipidemia anemia. Patient presenting today with altered mental status. One of patient's daughters saw her normal 7 AM. The other daughter came in at 8 AM and found her to have slurred speech. Her some ambiguity until last known normal. Patient presented with neurologic symptoms associated with urinary tract infection 1 month ago.  Past Medical History:  Diagnosis Date  . Anemia   . Chronic renal insufficiency   . Diabetes mellitus   . Hard of hearing   . Hyperlipidemia   . Hypertension   . Osteoarthritis   . Syphilis    ?latent?, treated c/ PCN 1993    Patient Active Problem List   Diagnosis Date Noted  . Acute cystitis without hematuria   . Encephalopathy 09/30/2016  . Hypertension 09/30/2016  . Diabetes mellitus type 2, uncontrolled (HCC) 09/30/2016  . Hard of hearing 09/30/2016  . CKD (chronic kidney disease), stage III 09/30/2016  . Dehydration, mild 09/30/2016  . Bilateral leg pain 09/30/2016  . Acute hyponatremia 09/30/2016  . Leg pain 08/11/2016  . Hyponatremia 08/07/2016  . Acute encephalopathy 08/06/2016  . Abnormal urinalysis 08/06/2016  . Arthritis of right knee 05/25/2016  . PCP NOTES >>> 01/22/2015  . Acute UTI 06/19/2014  . Essential hypertension 06/19/2014  . Hyperlipidemia 06/19/2014  . Well adult on routine health check 06/17/2013  . OSTEOARTHRITIS 02/18/2008  . CKD (chronic kidney disease) stage 4, GFR 15-29 ml/min (HCC) 10/12/2006  . DMII (diabetes mellitus, type 2) (HCC) 08/17/2006  . Anemia 08/17/2006    Past Surgical History:  Procedure Laterality Date  . ABDOMINAL HYSTERECTOMY     BSO  . CATARACT EXTRACTION    . cochlear impalnt  1/02  . NEPHRECTOMY     left  .  OOPHORECTOMY      OB History    No data available       Home Medications    Prior to Admission medications   Medication Sig Start Date End Date Taking? Authorizing Provider  acetaminophen (TYLENOL) 500 MG tablet Take 500 mg by mouth every 6 (six) hours as needed for mild pain.     [provider]  aspirin 81 MG tablet Take 81 mg by mouth daily.      [provider]  cholecalciferol (VITAMIN D) 1000 units tablet Take 1,000 Units by mouth daily.    [provider]  cloNIDine (CATAPRES) 0.1 MG tablet Take 1 tablet (0.1 mg total) by mouth 2 (two) times daily. 07/19/16   [name], MD  Diclofenac Sodium 3 % CREA Apply 2 g topically 2 (two) times daily as needed. 10/18/16   [name], MD  nortriptyline (PAMELOR) 10 MG capsule Take 1 capsule (10 mg total) by mouth at bedtime. 09/12/16   Patel, Donika K, DO  sitaGLIPtin (JANUVIA) 50 MG tablet Take 1 tablet (50 mg total) by mouth daily. 06/20/16   [name], MD  vitamin B-12 (CYANOCOBALAMIN) 100 MCG tablet Take 100 mcg by mouth daily.     [provider]  Vitamin D, Ergocalciferol, (DRISDOL) 50000 units CAPS capsule Take 1 capsule (50,000 Units total) by mouth every 7 (seven) days. Patient taking differently: Take 50,000 Units by mouth every  Sunday.  08/29/16   Wanda PlumpPaz, Jose E, MD    Family History Family History  Problem Relation Age of Onset  . Other Brother   . Diabetes Mellitus II Brother   . CAD Neg Hx     Social History Social History  Substance Use Topics  . Smoking status: Former Games developermoker  . Smokeless tobacco: Never Used  . Alcohol use No     Allergies   Amlodipine besylate and Lisinopril   Review of Systems Review of Systems  Constitutional: Negative for activity change and fatigue.  Eyes: Negative for discharge.  Respiratory: Negative for cough and chest tightness.   Cardiovascular: Negative for chest pain.  Gastrointestinal: Negative for abdominal distention.  Genitourinary:  Negative for difficulty urinating and dysuria.  Skin: Negative for rash.  Neurological: Positive for speech difficulty. Negative for seizures.  Psychiatric/Behavioral: Positive for confusion. Negative for agitation and behavioral problems.  All other systems reviewed and are negative.    Physical Exam Updated Vital Signs BP (!) 184/86 (BP Location: Right Arm)   Pulse 85   Temp 98.2 F (36.8 C) (Oral)   Resp 15   Ht 5' (1.524 m)   Wt 59.2 kg (130 lb 8.2 oz)   SpO2 99%   BMI 25.49 kg/m   Physical Exam  Constitutional: She appears well-developed and well-nourished.  Frail elderly female  HENT:  Head: Normocephalic and atraumatic.  Eyes: Right eye exhibits no discharge.  Cardiovascular: Normal rate, regular rhythm and normal heart sounds.   No murmur heard. Pulmonary/Chest: Effort normal and breath sounds normal. She has no wheezes. She has no rales.  Abdominal: Soft. She exhibits no distension. There is no tenderness.  Neurological:  Pt is difficult to assess given cochlear implant, questionable weakness on the left. Slurred speech (unclear baseline)    Skin: Skin is warm and dry. She is not diaphoretic.  Psychiatric: She has a normal mood and affect.  Nursing note and vitals reviewed.    ED Treatments / Results  Labs (all labs ordered are listed, but only abnormal results are displayed) Labs Reviewed  COMPREHENSIVE METABOLIC PANEL - Abnormal; Notable for the following:       Result Value   Sodium 131 (*)    Glucose, Bld 156 (*)    Creatinine, Ser 1.73 (*)    Total Protein 6.1 (*)    Albumin 2.9 (*)    AST 14 (*)    ALT 10 (*)    GFR calc non Af Amer 25 (*)    GFR calc Af Amer 29 (*)    All other components within normal limits  CBC WITH DIFFERENTIAL/PLATELET - Abnormal; Notable for the following:    Hemoglobin 10.5 (*)    HCT 32.6 (*)    All other components within normal limits  URINALYSIS, ROUTINE W REFLEX MICROSCOPIC - Abnormal; Notable for the following:     Glucose, UA 50 (*)    Protein, ur 100 (*)    Leukocytes, UA TRACE (*)    Bacteria, UA RARE (*)    Squamous Epithelial / LPF 0-5 (*)    All other components within normal limits  AMMONIA - Abnormal; Notable for the following:    Ammonia 47 (*)    All other components within normal limits  CBG MONITORING, ED - Abnormal; Notable for the following:    Glucose-Capillary 143 (*)    All other components within normal limits  I-STAT CHEM 8, ED - Abnormal; Notable for the following:  Sodium 131 (*)    Potassium 6.0 (*)    Chloride 100 (*)    BUN 29 (*)    Creatinine, Ser 1.70 (*)    Glucose, Bld 155 (*)    Hemoglobin 11.2 (*)    HCT 33.0 (*)    All other components within normal limits  URINE CULTURE  ETHANOL  I-STAT TROPOININ, ED    EKG  EKG Interpretation  Date/Time:  Wednesday October 19 2016 08:38:55 EDT Ventricular Rate:  83 PR Interval:    QRS Duration: 85 QT Interval:  352 QTC Calculation: 414 R Axis:     Text Interpretation:  Sinus rhythm Low voltage, precordial leads Borderline T wave abnormalities Normal sinus rhythm Confirmed by Corlis Leak, Lynix Bonine ([id number]) on 10/19/2016 8:59:11 AM       Radiology Ct Head Code Stroke Wo Contrast`  Addendum Date: 10/19/2016   ADDENDUM REPORT: 10/19/2016 08:30 ADDENDUM: These results were called by telephone at the time of interpretation on 10/19/2016 at 8:28 am to Dr. Wilford Corner, who verbally acknowledged these results. Electronically Signed   By: Marin Roberts M.D.   On: 10/19/2016 08:30   Result Date: 10/19/2016 CLINICAL DATA:  Code stroke. Sudden onset of confusion. Unable to walk. EXAM: CT HEAD WITHOUT CONTRAST TECHNIQUE: Contiguous axial images were obtained from the base of the skull through the vertex without intravenous contrast. COMPARISON:  CT head without contrast 10/02/2016. FINDINGS: Brain: Significant streak artifact from the cochlear implant is again noted. No acute infarct, hemorrhage, or mass lesion is present. Basal  ganglia are unremarkable. The insular ribbon is normal bilaterally. The ventricles are proportionate to the degree of atrophy. Mild white matter changes are stable. Vascular: Atherosclerotic calcifications are again noted within the cavernous internal carotid artery's bilaterally. There is no hyperdense vessel. Skull: The right mastoidectomy is noted. The calvarium is otherwise intact. No focal lytic or blastic lesions are present. The skullbase is within normal limits. Sinuses/Orbits: The paranasal sinuses clear. Bilateral lens replacements noted. The globes and orbits are otherwise unremarkable. ASPECTS [facility] Stroke Program Early CT Score) - Ganglionic level infarction (caudate, lentiform nuclei, internal capsule, insula, M1-M3 cortex): 7/7 - Supraganglionic infarction (M4-M6 cortex): 3/3 Total score (0-10 with 10 being normal): 10/10 IMPRESSION: 1. No acute intracranial abnormality or significant interval change. 2. Stable atrophy white matter disease. This likely reflects the sequela of chronic microvascular ischemia. 3. ASPECTS is 10/10 Electronically Signed: By: Marin Roberts M.D. On: 10/19/2016 08:27    Procedures Procedures (including critical care time)  Medications Ordered in ED Medications  sodium chloride 0.9 % bolus 500 mL (not administered)     Initial Impression / Assessment and Plan / ED Course  I have reviewed the triage vital signs and the nursing notes.  Pertinent labs & imaging results that were available during my care of the patient were reviewed by me and considered in my medical decision making (see chart for details).     Elderly 83 year old female presenting with unclear altered mental status. Patient has some  mild slurred speech. Patient's very difficult to assess because she has a cochlear implant is not working properly for moment. Also very unclear onset of symptoms.  Initially called a code stroke. However with unclear onset, unclear symptoms TPA not  given. We'll continue to evaluate symptoms.   Patient had similar symptoms previously treated to a UTI. +enterococcus. The time she had an EEG which showed some mild dysfunction. Felt to be from a previous stroke. Patient unable to get MRI secondary to  cochlear implant.  We'll get metabolic workup for source of altered mental status.  Metabolic workup shows mildly elevated ammonia, elevated potassium, ambivalent UA.  Patient does not seem to be able to respond to the cochlear implant.  Family has emailed cochlear people for further assistance.   Will admit to hospital for further workup for AMS.  Final Clinical Impressions(s) / ED Diagnoses   Final diagnoses:  Stroke-like symptoms  Stroke (cerebrum) [facility])    New Prescriptions New Prescriptions   No medications on file     Abelino Derrick, MD 10/19/16 [phone]

## 2016-10-19 NOTE — Telephone Encounter (Signed)
FYI

## 2016-10-20 DIAGNOSIS — R2990 Unspecified symptoms and signs involving the nervous system: Secondary | ICD-10-CM | POA: Diagnosis not present

## 2016-10-20 DIAGNOSIS — E785 Hyperlipidemia, unspecified: Secondary | ICD-10-CM | POA: Diagnosis present

## 2016-10-20 DIAGNOSIS — Z833 Family history of diabetes mellitus: Secondary | ICD-10-CM | POA: Diagnosis not present

## 2016-10-20 DIAGNOSIS — I679 Cerebrovascular disease, unspecified: Secondary | ICD-10-CM | POA: Diagnosis not present

## 2016-10-20 DIAGNOSIS — R29711 NIHSS score 11: Secondary | ICD-10-CM | POA: Diagnosis present

## 2016-10-20 DIAGNOSIS — G9340 Encephalopathy, unspecified: Secondary | ICD-10-CM | POA: Diagnosis present

## 2016-10-20 DIAGNOSIS — I680 Cerebral amyloid angiopathy: Secondary | ICD-10-CM | POA: Diagnosis present

## 2016-10-20 DIAGNOSIS — N179 Acute kidney failure, unspecified: Secondary | ICD-10-CM | POA: Diagnosis present

## 2016-10-20 DIAGNOSIS — E1122 Type 2 diabetes mellitus with diabetic chronic kidney disease: Secondary | ICD-10-CM | POA: Diagnosis present

## 2016-10-20 DIAGNOSIS — E875 Hyperkalemia: Secondary | ICD-10-CM | POA: Diagnosis present

## 2016-10-20 DIAGNOSIS — R402412 Glasgow coma scale score 13-15, at arrival to emergency department: Secondary | ICD-10-CM | POA: Diagnosis present

## 2016-10-20 DIAGNOSIS — I129 Hypertensive chronic kidney disease with stage 1 through stage 4 chronic kidney disease, or unspecified chronic kidney disease: Secondary | ICD-10-CM | POA: Diagnosis present

## 2016-10-20 DIAGNOSIS — R64 Cachexia: Secondary | ICD-10-CM | POA: Diagnosis present

## 2016-10-20 DIAGNOSIS — R4182 Altered mental status, unspecified: Secondary | ICD-10-CM | POA: Diagnosis not present

## 2016-10-20 DIAGNOSIS — Z66 Do not resuscitate: Secondary | ICD-10-CM | POA: Diagnosis present

## 2016-10-20 DIAGNOSIS — I619 Nontraumatic intracerebral hemorrhage, unspecified: Secondary | ICD-10-CM | POA: Diagnosis not present

## 2016-10-20 DIAGNOSIS — N189 Chronic kidney disease, unspecified: Secondary | ICD-10-CM | POA: Diagnosis not present

## 2016-10-20 DIAGNOSIS — F329 Major depressive disorder, single episode, unspecified: Secondary | ICD-10-CM | POA: Diagnosis present

## 2016-10-20 DIAGNOSIS — I615 Nontraumatic intracerebral hemorrhage, intraventricular: Secondary | ICD-10-CM | POA: Diagnosis present

## 2016-10-20 DIAGNOSIS — E1159 Type 2 diabetes mellitus with other circulatory complications: Secondary | ICD-10-CM | POA: Diagnosis not present

## 2016-10-20 DIAGNOSIS — Z888 Allergy status to other drugs, medicaments and biological substances status: Secondary | ICD-10-CM | POA: Diagnosis not present

## 2016-10-20 DIAGNOSIS — Z87440 Personal history of urinary (tract) infections: Secondary | ICD-10-CM | POA: Diagnosis not present

## 2016-10-20 DIAGNOSIS — Z515 Encounter for palliative care: Secondary | ICD-10-CM | POA: Diagnosis present

## 2016-10-20 DIAGNOSIS — G464 Cerebellar stroke syndrome: Secondary | ICD-10-CM | POA: Diagnosis not present

## 2016-10-20 DIAGNOSIS — N3000 Acute cystitis without hematuria: Secondary | ICD-10-CM | POA: Diagnosis present

## 2016-10-20 DIAGNOSIS — E46 Unspecified protein-calorie malnutrition: Secondary | ICD-10-CM | POA: Diagnosis not present

## 2016-10-20 DIAGNOSIS — H9190 Unspecified hearing loss, unspecified ear: Secondary | ICD-10-CM | POA: Diagnosis present

## 2016-10-20 DIAGNOSIS — N184 Chronic kidney disease, stage 4 (severe): Secondary | ICD-10-CM | POA: Diagnosis present

## 2016-10-20 DIAGNOSIS — R9401 Abnormal electroencephalogram [EEG]: Secondary | ICD-10-CM | POA: Diagnosis present

## 2016-10-20 DIAGNOSIS — I10 Essential (primary) hypertension: Secondary | ICD-10-CM | POA: Diagnosis not present

## 2016-10-20 DIAGNOSIS — R5383 Other fatigue: Secondary | ICD-10-CM | POA: Diagnosis not present

## 2016-10-20 DIAGNOSIS — E854 Organ-limited amyloidosis: Secondary | ICD-10-CM | POA: Diagnosis present

## 2016-10-20 DIAGNOSIS — E860 Dehydration: Secondary | ICD-10-CM | POA: Diagnosis present

## 2016-10-20 DIAGNOSIS — E871 Hypo-osmolality and hyponatremia: Secondary | ICD-10-CM | POA: Diagnosis present

## 2016-10-20 LAB — BASIC METABOLIC PANEL
Anion gap: 10 (ref 5–15)
BUN: 15 mg/dL (ref 6–20)
CALCIUM: 9.5 mg/dL (ref 8.9–10.3)
CHLORIDE: 102 mmol/L (ref 101–111)
CO2: 23 mmol/L (ref 22–32)
CREATININE: 1.49 mg/dL — AB (ref 0.44–1.00)
GFR calc non Af Amer: 29 mL/min — ABNORMAL LOW (ref >60)
GFR, EST AFRICAN AMERICAN: 34 mL/min — AB (ref >60)
Glucose, Bld: 113 mg/dL — ABNORMAL HIGH (ref 65–99)
Potassium: 4.3 mmol/L (ref 3.5–5.1)
SODIUM: 135 mmol/L (ref 135–145)

## 2016-10-20 LAB — GLUCOSE, CAPILLARY
GLUCOSE-CAPILLARY: 107 mg/dL — AB (ref 65–99)
GLUCOSE-CAPILLARY: 192 mg/dL — AB (ref 65–99)
GLUCOSE-CAPILLARY: 264 mg/dL — AB (ref 65–99)
Glucose-Capillary: 294 mg/dL — ABNORMAL HIGH (ref 65–99)

## 2016-10-20 LAB — LIPID PANEL
CHOL/HDL RATIO: 2.9 ratio
Cholesterol: 313 mg/dL — ABNORMAL HIGH (ref 0–200)
HDL: 108 mg/dL (ref >40)
LDL Cholesterol: 189 mg/dL — ABNORMAL HIGH (ref 0–99)
Triglycerides: 79 mg/dL (ref <150)
VLDL: 16 mg/dL (ref 0–40)

## 2016-10-20 LAB — CBC
HCT: 33.4 % — ABNORMAL LOW (ref 36.0–46.0)
Hemoglobin: 10.7 g/dL — ABNORMAL LOW (ref 12.0–15.0)
MCH: 26.9 pg (ref 26.0–34.0)
MCHC: 32 g/dL (ref 30.0–36.0)
MCV: 83.9 fL (ref 78.0–100.0)
PLATELETS: 336 10*3/uL (ref 150–400)
RBC: 3.98 MIL/uL (ref 3.87–5.11)
RDW: 13.7 % (ref 11.5–15.5)
WBC: 5.4 10*3/uL (ref 4.0–10.5)

## 2016-10-20 MED ORDER — WHITE PETROLATUM GEL
Status: AC
  Administered 2016-10-20: 22:00:00
  Filled 2016-10-20: qty 1

## 2016-10-20 MED ORDER — PIPERACILLIN-TAZOBACTAM 3.375 G IVPB
3.3750 g | Freq: Three times a day (TID) | INTRAVENOUS | Status: DC
Start: 2016-10-20 — End: 2016-10-21
  Administered 2016-10-20 – 2016-10-21 (×3): 3.375 g via INTRAVENOUS
  Filled 2016-10-20 (×4): qty 50

## 2016-10-20 MED ORDER — HALOPERIDOL LACTATE 5 MG/ML IJ SOLN
INTRAMUSCULAR | Status: AC
  Administered 2016-10-20: 2 mg
  Filled 2016-10-20: qty 1

## 2016-10-20 MED ORDER — HALOPERIDOL LACTATE 5 MG/ML IJ SOLN: 2.0000 mg | Freq: Once | INTRAMUSCULAR | Status: DC

## 2016-10-20 NOTE — Evaluation (Signed)
Occupational Therapy Evaluation Patient Details Name: Jeanne Barker MRN: [id number] DOB: 05/02/1923 Today's Date: 10/20/2016    History of Present Illness 83 y.o. female with medical history significant for hypertension, diabetes 2 on oral agents, hard of hearing with cochlear implant, osteoarthritis, stage III chronic kidney disease, syphilis, and history of bilateral leg pain. CT negative for acute infarct. She was admitted with dx of encephalopathy.     Clinical Impression   PTA Pt required min A for bathing, and food prep but was independent in other ADL with supervision available from family. Pt used rollator for mobility PTA. Pt is currently total A for all ADL and bed mobility this session. Evaluation limited due to Pt being very lethargic (suspect medication) but daughter in room and saying that she has seen a big cognitive change and that's why they came into the hospital. Pt will benefit from skilled OT in the acute setting and SNF level therapy upon dc to maximize safety and independence in ADL and functional transfers.    Follow Up Recommendations  SNF;Supervision/Assistance - 24 hour    Equipment Recommendations  Other (comment) (defer to next venue of care)    Recommendations for Other Services       Precautions / Restrictions Precautions Precautions: Fall Restrictions Weight Bearing Restrictions: No      Mobility Bed Mobility Overal bed mobility: Needs Assistance Bed Mobility: Rolling Rolling: Total assist;+2 for physical assistance         General bed mobility comments: PPt required total assistance with bed mobility  Transfers                 General transfer comment: not safe to attempt at this time due to arousal    Balance                                           ADL either performed or assessed with clinical judgement   ADL Overall ADL's : Needs assistance/impaired                                        General ADL Comments: currently a total assist for all ADL     Vision Baseline Vision/History: Wears glasses Wears Glasses: At all times Additional Comments: history obtained from daughter     Perception     Praxis      Pertinent Vitals/Pain Pain Assessment: Faces Faces Pain Scale: No hurt     Hand Dominance Right   Extremity/Trunk Assessment Upper Extremity Assessment Upper Extremity Assessment: Difficult to assess due to impaired cognition (arousal) LUE Deficits / Details: from previous admission: Slightly decreased strength as compared to R with 3+/5 strength.   Lower Extremity Assessment Lower Extremity Assessment: Defer to PT evaluation   Cervical / Trunk Assessment Cervical / Trunk Assessment: Kyphotic   Communication Communication Communication: HOH;Other (comment) (has cochlear implant that is non-functioning)   Cognition Arousal/Alertness: Lethargic;Suspect due to medications Behavior During Therapy: Flat affect Overall Cognitive Status: Impaired/Different from baseline Area of Impairment: Following commands;Awareness;Problem solving                       Following Commands: Follows one step commands inconsistently   Awareness: Intellectual Problem Solving: Requires tactile cues;Requires verbal cues General Comments: Pt medicated prior to  session, Ativan. Pt did not respond to commands from OT or Nurse tech during this session   General Comments  Pt's daughter present and providing background information    Exercises     Shoulder Instructions      Home Living Family/patient expects to be discharged to:: Skilled nursing facility Living Arrangements: Children;Other relatives Available Help at Discharge: Family;Available 24 hours/day Type of Home: House Home Access: Level entry     Home Layout: One level     Bathroom Shower/Tub: Producer, television/film/video: Handicapped height Bathroom Accessibility: Yes How Accessible:  Accessible via walker Home Equipment: Walker - 2 wheels;Walker - 4 wheels;Shower seat          Prior Functioning/Environment Level of Independence: Needs assistance  Gait / Transfers Assistance Needed: Uses 4 wheel walker, but also has 2 wheel walker ADL's / Homemaking Assistance Needed: Assistance needed for bathing, preparing food, daughters rotate providing assistance            OT Problem List: Decreased activity tolerance;Impaired balance (sitting and/or standing);Decreased cognition;Decreased safety awareness;Decreased knowledge of use of DME or AE      OT Treatment/Interventions: Self-care/ADL training;Therapeutic exercise;Neuromuscular education;Cognitive remediation/compensation;Patient/family education;Balance training    OT Goals(Current goals can be found in the care plan section) Acute Rehab OT Goals Patient Stated Goal: home, per daughters OT Goal Formulation: With family Time For Goal Achievement: 11/03/16 Potential to Achieve Goals: Fair ADL Goals Pt Will Perform Grooming: with max assist;bed level Additional ADL Goal #1: Pt will perform bed mobility at max A level as a precursor activity in preparation for ADL  OT Frequency: Min 2X/week   Barriers to D/C:            Co-evaluation              AM-PAC PT "6 Clicks" Daily Activity     Outcome Measure Help from another person eating meals?: Total Help from another person taking care of personal grooming?: Total Help from another person toileting, which includes using toliet, bedpan, or urinal?: Total Help from another person bathing (including washing, rinsing, drying)?: Total Help from another person to put on and taking off regular upper body clothing?: Total Help from another person to put on and taking off regular lower body clothing?: Total 6 Click Score: 6   End of Session Nurse Communication: Mobility status  Activity Tolerance: Patient limited by lethargy;Other (comment) (suspect to  medication) Patient left: in bed;with call bell/phone within reach;with nursing/sitter in room;with family/visitor present  OT Visit Diagnosis: Unsteadiness on feet (R26.81);Other abnormalities of gait and mobility (R26.89);Other symptoms and signs involving the nervous system (R29.898);Other symptoms and signs involving cognitive function                Time: 1134-1151 OT Time Calculation (min): 17 min Charges:  OT General Charges $OT Visit: 1 Procedure OT Evaluation $OT Eval Moderate Complexity: 1 Procedure G-Codes: OT G-codes **NOT FOR INPATIENT CLASS** Functional Assessment Tool Used: AM-PAC 6 Clicks Daily Activity Functional Limitation: Self care Self Care Current Status (X9147): 100 percent impaired, limited or restricted Self Care Goal Status (W2956): 100 percent impaired, limited or restricted   Sherryl Manges OTR/L [phone] Evern Bio Keyaria Lawson 10/20/2016, 3:49 PM

## 2016-10-20 NOTE — Progress Notes (Addendum)
[facility] PROGRESS NOTE  Jeanne RammingRuth B Loyal WJX:[id number] DOB: 07/31/1923 DOA: 10/19/2016  PCP: Jeanne PlumpPaz, Jeanne E, MD  Brief History/Interval Summary: 83 year old African-American female with a past medical history of recurrent UTIs, anemia, diabetes, hypertension, was recently treated for UTIs initially with Keflex and then with amoxicillin. She presented with slurred speech, generalized weakness and lethargy. She lives at her own home, but her daughters provide around-the-clock care. Initially there was concern for stroke. However, it was felt that her symptoms were due to urinary tract infection.  Reason for Visit: Acute encephalopathy  Consultants: Neurology  Procedures: None  Antibiotics: Ceftriaxone  Subjective/Interval History: Patient was quite agitated earlier this morning. She had to be given Haldol as she was trying to get out of bed. Currently, she is calm. Her daughter is at the bedside.  ROS: Unable to do  Objective:  Vital Signs  Vitals:   10/20/16 0043 10/20/16 0730 10/20/16 0900 10/20/16 1010  BP: (!) 112/58 (!) 186/67 (!) 130/98   Pulse: 88 (!) 101 (!) 116 92  Resp: 18 18 20    Temp: 98.6 F (37 C) (!) 97.4 F (36.3 C) 98.1 F (36.7 C)   TempSrc: Axillary Axillary Oral   SpO2: 100% 100% 100% 97%  Weight:      Height:        Intake/Output Summary (Last 24 hours) at 10/20/16 1317 Last data filed at 10/20/16 0230  Gross per 24 hour  Intake                0 ml  Output             1550 ml  Net            -1550 ml   Filed Weights   10/19/16 0800  Weight: 59.2 kg (130 lb 8.2 oz)    General appearance: Was agitated. Currently she is mildly sedated. No distress Resp: clear to auscultation bilaterally Cardio: regular rate and rhythm, S1, S2 normal, no murmur, click, rub or gallop GI: soft, non-tender; bowel sounds normal; no masses,  no organomegaly Extremities: extremities normal, atraumatic, no cyanosis or edema Neurological: mildly sedated. Moving  her extremities.  Lab Results:  Data Reviewed: I have personally reviewed following labs and imaging studies  CBC:  Recent Labs Lab 10/18/16 1204 10/19/16 0827 10/19/16 0833 10/20/16 0629  WBC 5.7 5.5  --  5.4  NEUTROABS 3.4 3.3  --   --   HGB 10.6* 10.5* 11.2* 10.7*  HCT 32.3* 32.6* 33.0* 33.4*  MCV 83.1 84.0  --  83.9  PLT 369.0 301  --  336    Basic Metabolic Panel:  Recent Labs Lab 10/18/16 1204 10/19/16 0827 10/19/16 0833 10/19/16 1936 10/20/16 0629  NA 128* 131* 131* 134* 135  K 5.0 4.6 6.0* 5.0 4.3  CL 96 101 100* 102 102  CO2 26 24  --  25 23  GLUCOSE 151* 156* 155* 113* 113*  BUN 17 18 29* 15 15  CREATININE 1.41* 1.73* 1.70* 1.40* 1.49*  CALCIUM 9.6 9.3  --  9.9 9.5    GFR: Estimated Creatinine Clearance: 19.8 mL/min (A) (by C-G formula based on SCr of 1.49 mg/dL (H)).  Liver Function Tests:  Recent Labs Lab 10/19/16 0827  AST 14*  ALT 10*  ALKPHOS 67  BILITOT 0.7  PROT 6.1*  ALBUMIN 2.9*     Recent Labs Lab 10/19/16 0827  AMMONIA 47*    CBG:  Recent Labs Lab 10/19/16 1138 10/19/16 1615 10/19/16  2241 10/20/16 0609 10/20/16 1140  GLUCAP 124* 107* 108* 107* 192*    Lipid Profile:  Recent Labs  10/20/16 0629  CHOL 313*  HDL 108  LDLCALC 189*  TRIG 79  CHOLHDL 2.9     Recent Results (from the past 240 hour(s))  Urine culture     Status: Abnormal (Preliminary result)   Collection Time: 10/19/16  8:36 AM  Result Value Ref Range Status   Specimen Description URINE, CLEAN CATCH  Final   Special Requests NONE  Final   Culture (A)  Final    40,000 COLONIES/mL ENTEROCOCCUS FAECALIS SUSCEPTIBILITIES TO FOLLOW    Report Status PENDING  Incomplete      Radiology Studies: Dg Chest Port 1 View  Result Date: 10/19/2016 CLINICAL DATA:  Code stroke,left sided weakness, confusion and slurred speech. Hx htn,dm EXAM: PORTABLE CHEST - 1 VIEW COMPARISON:  09/30/2016 FINDINGS: Relatively low lung volumes with mild interstitial  prominence as before. No confluent airspace disease. Heart size and mediastinal contours are within normal limits. No effusion. Visualized bones unremarkable. IMPRESSION: Low volumes.  No acute disease. Electronically Signed   By: Corlis Leak M.D.   On: 10/19/2016 09:39   Ct Head Code Stroke Wo Contrast`  Addendum Date: 10/19/2016   ADDENDUM REPORT: 10/19/2016 08:30 ADDENDUM: These results were called by telephone at the time of interpretation on 10/19/2016 at 8:28 am to Dr. Wilford Corner, who verbally acknowledged these results. Electronically Signed   By: Marin Roberts M.D.   On: 10/19/2016 08:30   Result Date: 10/19/2016 CLINICAL DATA:  Code stroke. Sudden onset of confusion. Unable to walk. EXAM: CT HEAD WITHOUT CONTRAST TECHNIQUE: Contiguous axial images were obtained from the base of the skull through the vertex without intravenous contrast. COMPARISON:  CT head without contrast 10/02/2016. FINDINGS: Brain: Significant streak artifact from the cochlear implant is again noted. No acute infarct, hemorrhage, or mass lesion is present. Basal ganglia are unremarkable. The insular ribbon is normal bilaterally. The ventricles are proportionate to the degree of atrophy. Mild white matter changes are stable. Vascular: Atherosclerotic calcifications are again noted within the cavernous internal carotid artery's bilaterally. There is no hyperdense vessel. Skull: The right mastoidectomy is noted. The calvarium is otherwise intact. No focal lytic or blastic lesions are present. The skullbase is within normal limits. Sinuses/Orbits: The paranasal sinuses clear. Bilateral lens replacements noted. The globes and orbits are otherwise unremarkable. ASPECTS [facility] Stroke Program Early CT Score) - Ganglionic level infarction (caudate, lentiform nuclei, internal capsule, insula, M1-M3 cortex): 7/7 - Supraganglionic infarction (M4-M6 cortex): 3/3 Total score (0-10 with 10 being normal): 10/10 IMPRESSION: 1. No acute  intracranial abnormality or significant interval change. 2. Stable atrophy white matter disease. This likely reflects the sequela of chronic microvascular ischemia. 3. ASPECTS is 10/10 Electronically Signed: By: Marin Roberts M.D. On: 10/19/2016 08:27     Medications:  Scheduled: . aspirin EC  81 mg Oral Daily  . haloperidol lactate  2 mg Intravenous Once  . heparin  5,000 Units Subcutaneous Q8H  . insulin aspart  0-5 Units Subcutaneous QHS  . insulin aspart  0-9 Units Subcutaneous TID WC  . insulin aspart  3 Units Intravenous Once  . nortriptyline  10 mg Oral QHS   Continuous: . sodium chloride 75 mL/hr at 10/19/16 1347  . cefTRIAXone (ROCEPHIN)  IV Stopped (10/19/16 1417)   ZOX:[id number] **OR** acetaminophen, Diclofenac Sodium, hydrALAZINE, ondansetron **OR** ondansetron (ZOFRAN) IV  Assessment/Plan:  Active Problems:   DMII (diabetes mellitus, type 2) (HCC)  CKD (chronic kidney disease) stage 4, GFR 15-29 ml/min (HCC)   Essential hypertension   Dehydration, mild   Acute hyponatremia   Acute cystitis without hematuria   Stroke-like episode (HCC)    Acute encephalopathy Initially there was concern for stroke. Patient was seen by neurology. Patient unable to undergo MRI due to cochlear implant. She is not a candidate for aggressive interventions. If mental status does not improve, then will repeat CT scan. Neurology does not recommend any further workup. She was agitated this morning, which could be due to new surroundings, hospital stay. She had to be given Haldol. Treat UTI.   Recurrent UTI She was hospitalized in June. Urine cultures were positive for enterococcus. Initially treated with cephalosporins but then changed over to amoxicillin. Urine cultures sent from the EGD. Continue ceftriaxone for now. She did have recent renal ultrasound in May which did not show any acute findings. She is noted to have had left nephrectomy for unknown reasons.  Chronic kidney  disease, stage 3 Stable. Continue to monitor urine output.  Deafness Has a cochlear implant.  Essential hypertension Continue to monitor blood pressures closely.  History of depression Resume medications when able.  DVT Prophylaxis: Subcutaneous heparin    Code Status: Full code  Family Communication: Discussed with daughter  Disposition Plan: Management as outlined above    LOS: 0 days   [facility]  [facility] Pager [phone] 10/20/2016, 1:17 PM  If 7PM-7AM, please contact night-coverage at [email or web address], password [facility]

## 2016-10-20 NOTE — Evaluation (Signed)
  SLP Cancellation Note  Patient Details Name: Jeanne RammingRuth B Barker MRN: [id number] DOB: 06/22/1923   Cancelled treatment:       Reason Eval/Treat Not Completed:  (pt agitated, flailing around, [name] (daughter) present in room reports pt agitated and trying to get OOB, requesting medication to relax pt, RN aware, will continue efforts when pt able to participate)   [name], MS [facility] SLP [phone]

## 2016-10-20 NOTE — Evaluation (Signed)
Physical Therapy Evaluation Patient Details Name: Jeanne RammingRuth B Barker MRN: [id number] DOB: 04/04/1924 Today's Date: 10/20/2016   History of Present Illness  83 y.o. female with medical history significant for hypertension, diabetes 2 on oral agents, hard of hearing with cochlear implant, osteoarthritis, stage III chronic kidney disease, syphilis, and history of bilateral leg pain. CT negative for acute infarct. She was admitted with dx of encephalopathy.    Clinical Impression  Pt with significant changes from baseline in both mobility and cognition.  She is normally ambulatory and today required three person assist to sit EOB and was sweating profusely.  At her current level she is most appropriate for SNF placement at discharge.   PT to follow acutely for deficits listed below.     Follow Up Recommendations SNF    Equipment Recommendations  None recommended by PT    Recommendations for Other Services   NA    Precautions / Restrictions Precautions Precautions: Fall      Mobility  Bed Mobility Overal bed mobility: Needs Assistance Bed Mobility: Supine to Sit;Sit to Supine     Supine to sit: Total assist;+2 for physical assistance Sit to supine: Total assist;+2 for physical assistance   General bed mobility comments: Pt required total assistance with bed mobility, extended trunk during sitting and generally pushing the opposite way of therapists' assist.   Transfers                 General transfer comment: unable to transfer safely at this time.                    Modified Rankin (Stroke Patients Only) Modified Rankin (Stroke Patients Only) Pre-Morbid Rankin Score: Moderately severe disability Modified Rankin: Severe disability     Balance Overall balance assessment: Needs assistance Sitting-balance support: Bilateral upper extremity supported;Feet unsupported Sitting balance-Leahy Scale: Zero Sitting balance - Comments: Required total assistance +3 for  balance during sitting due to strong posterior pushing.  Postural control: Posterior lean                                   Pertinent Vitals/Pain Pain Assessment: Faces Faces Pain Scale: No hurt    Home Living Family/patient expects to be discharged to:: Private residence Living Arrangements: Alone Available Help at Discharge: Family;Available 24 hours/day Type of Home: House Home Access: Level entry     Home Layout: One level Home Equipment: Walker - 2 wheels;Walker - 4 wheels;Shower seat      Prior Function Level of Independence: Independent with assistive device(s)   Gait / Transfers Assistance Needed: Uses 4 wheel walker, but also has 2 wheel walker  ADL's / Homemaking Assistance Needed: assistance needed for bathing, preparing food, daughters rotate providing assistance        Hand Dominance   Dominant Hand: Right    Extremity/Trunk Assessment   Upper Extremity Assessment Upper Extremity Assessment: Defer to OT evaluation    Lower Extremity Assessment Lower Extremity Assessment: Generalized weakness    Cervical / Trunk Assessment Cervical / Trunk Assessment: Kyphotic  Communication   Communication: HOH;Other (comment) (has cochlear implant that is non-functioning, so deaf)  Cognition Arousal/Alertness: Lethargic Behavior During Therapy: Flat affect Overall Cognitive Status: Impaired/Different from baseline Area of Impairment: Orientation;Attention;Awareness;Memory;Following commands;Safety/judgement;Problem solving                 Orientation Level: Disoriented to;Person;Place;Time;Situation Current Attention Level: Focused Memory: Decreased recall  of precautions;Decreased short-term memory Following Commands: Follows one step commands inconsistently Safety/Judgement: Decreased awareness of safety;Decreased awareness of deficits Awareness: Intellectual Problem Solving: Requires tactile cues;Requires verbal cues;Slow  processing;Decreased initiation;Difficulty sequencing General Comments: Pt not following commands, making eye contact, does not currently seem to recognize her daughter.        General Comments General comments (skin integrity, edema, etc.): Pt's daughter present and reporting this is far from her mother's most recent baseline.         Assessment/Plan    PT Assessment Patient needs continued PT services  PT Problem List Decreased balance;Decreased cognition;Other (comment);Decreased strength;Decreased activity tolerance;Decreased mobility;Decreased coordination;Decreased knowledge of use of DME;Decreased safety awareness;Decreased knowledge of precautions       PT Treatment Interventions DME instruction;Gait training;Functional mobility training;Therapeutic activities;Therapeutic exercise;Balance training;Patient/family education    PT Goals (Current goals can be found in the Care Plan section)  Acute Rehab PT Goals Patient Stated Goal: daughter wants her to return to PLOF PT Goal Formulation: With family Time For Goal Achievement: 11/03/16 Potential to Achieve Goals: Fair    Frequency Min 3X/week           AM-PAC PT "6 Clicks" Daily Activity  Outcome Measure Difficulty turning over in bed (including adjusting bedclothes, sheets and blankets)?: Total Difficulty moving from lying on back to sitting on the side of the bed? : Total Difficulty sitting down on and standing up from a chair with arms (e.g., wheelchair, bedside commode, etc,.)?: Total Help needed moving to and from a bed to chair (including a wheelchair)?: Total Help needed walking in hospital room?: Total Help needed climbing 3-5 steps with a railing? : Total 6 Click Score: 6    End of Session   Activity Tolerance: Patient limited by lethargy Patient left: in bed;with call bell/phone within reach;with bed alarm set;with nursing/sitter in room;with family/visitor present Nurse Communication: Mobility status PT  Visit Diagnosis: Unsteadiness on feet (R26.81);Muscle weakness (generalized) (M62.81);Difficulty in walking, not elsewhere classified (R26.2)    Time: 6962-9528 PT Time Calculation (min) (ACUTE ONLY): 22 min   Charges:   PT Evaluation $PT Eval Moderate Complexity: 1 Procedure        Karrie Fluellen B. Ryot Burrous, PT, DPT [phone]   With contributions by:  Sebastian Ache, SPTA Office [phone]  10/20/2016, 11:03 PM

## 2016-10-21 ENCOUNTER — Inpatient Hospital Stay (HOSPITAL_COMMUNITY): Payer: Medicare Other

## 2016-10-21 ENCOUNTER — Inpatient Hospital Stay (HOSPITAL_COMMUNITY)
Admit: 2016-10-21 | Discharge: 2016-10-21 | Disposition: A | Discharge status: Home / Self Care | Payer: Medicare Other | Attending: Student in an Organized Health Care Education/Training Program | Admitting: Student in an Organized Health Care Education/Training Program

## 2016-10-21 ENCOUNTER — Telehealth: Payer: Self-pay | Admitting: Internal Medicine

## 2016-10-21 DIAGNOSIS — I619 Nontraumatic intracerebral hemorrhage, unspecified: Secondary | ICD-10-CM

## 2016-10-21 LAB — CBC
HEMATOCRIT: 36.9 % (ref 36.0–46.0)
HEMOGLOBIN: 12.2 g/dL (ref 12.0–15.0)
MCH: 27.3 pg (ref 26.0–34.0)
MCHC: 33.1 g/dL (ref 30.0–36.0)
MCV: 82.6 fL (ref 78.0–100.0)
Platelets: 307 10*3/uL (ref 150–400)
RBC: 4.47 MIL/uL (ref 3.87–5.11)
RDW: 13.7 % (ref 11.5–15.5)
WBC: 11.7 10*3/uL — ABNORMAL HIGH (ref 4.0–10.5)

## 2016-10-21 LAB — BASIC METABOLIC PANEL
Anion gap: 13 (ref 5–15)
BUN: 25 mg/dL — ABNORMAL HIGH (ref 6–20)
CHLORIDE: 105 mmol/L (ref 101–111)
CO2: 18 mmol/L — ABNORMAL LOW (ref 22–32)
Calcium: 9.6 mg/dL (ref 8.9–10.3)
Creatinine, Ser: 1.82 mg/dL — ABNORMAL HIGH (ref 0.44–1.00)
GFR calc non Af Amer: 23 mL/min — ABNORMAL LOW (ref >60)
GFR, EST AFRICAN AMERICAN: 27 mL/min — AB (ref >60)
Glucose, Bld: 223 mg/dL — ABNORMAL HIGH (ref 65–99)
POTASSIUM: 5.2 mmol/L — AB (ref 3.5–5.1)
SODIUM: 136 mmol/L (ref 135–145)

## 2016-10-21 LAB — URINE CULTURE: Culture: 40000 — AB

## 2016-10-21 LAB — HEMOGLOBIN A1C
Hgb A1c MFr Bld: 7 % — ABNORMAL HIGH (ref 4.8–5.6)
Mean Plasma Glucose: 154 mg/dL

## 2016-10-21 LAB — GLUCOSE, CAPILLARY: GLUCOSE-CAPILLARY: 189 mg/dL — AB (ref 65–99)

## 2016-10-21 MED ORDER — ONDANSETRON 4 MG PO TBDP: 4.0000 mg | ORAL_TABLET | Freq: Four times a day (QID) | ORAL | 0 refills | Status: AC | PRN

## 2016-10-21 MED ORDER — GLYCOPYRROLATE 1 MG PO TABS: 1.0000 mg | ORAL_TABLET | ORAL | Status: AC | PRN

## 2016-10-21 MED ORDER — GLYCOPYRROLATE 1 MG PO TABS: 1.0000 mg | ORAL_TABLET | ORAL | Status: DC | PRN

## 2016-10-21 MED ORDER — MORPHINE SULFATE (PF) 2 MG/ML IV SOLN: 1.0000 mg | INTRAVENOUS | 0 refills | Status: AC | PRN

## 2016-10-21 MED ORDER — POLYVINYL ALCOHOL 1.4 % OP SOLN: 1.0000 [drp] | Freq: Four times a day (QID) | OPHTHALMIC | 0 refills | Status: AC | PRN

## 2016-10-21 MED ORDER — LORAZEPAM 2 MG/ML PO CONC: 1.0000 mg | ORAL | Status: DC | PRN

## 2016-10-21 MED ORDER — LORAZEPAM 1 MG PO TABS: 1.0000 mg | ORAL_TABLET | ORAL | Status: DC | PRN

## 2016-10-21 MED ORDER — ONDANSETRON 4 MG PO TBDP: 4.0000 mg | ORAL_TABLET | Freq: Four times a day (QID) | ORAL | Status: DC | PRN

## 2016-10-21 MED ORDER — GLYCOPYRROLATE 0.2 MG/ML IJ SOLN: 0.2000 mg | INTRAMUSCULAR | Status: AC | PRN

## 2016-10-21 MED ORDER — LORAZEPAM 2 MG/ML PO CONC: 1.0000 mg | ORAL | 0 refills | Status: AC | PRN

## 2016-10-21 MED ORDER — GLYCOPYRROLATE 0.2 MG/ML IJ SOLN: 0.2000 mg | INTRAMUSCULAR | Status: DC | PRN

## 2016-10-21 MED ORDER — MORPHINE SULFATE (PF) 2 MG/ML IV SOLN: 1.0000 mg | INTRAVENOUS | Status: DC | PRN

## 2016-10-21 MED ORDER — LORAZEPAM 1 MG PO TABS: 1.0000 mg | ORAL_TABLET | ORAL | 0 refills | Status: AC | PRN

## 2016-10-21 MED ORDER — BIOTENE DRY MOUTH MT LIQD: 15.0000 mL | OROMUCOSAL | Status: AC | PRN

## 2016-10-21 MED ORDER — HALOPERIDOL LACTATE 2 MG/ML PO CONC
0.5000 mg | ORAL | Status: DC | PRN
  Filled 2016-10-21: qty 0.3

## 2016-10-21 MED ORDER — HALOPERIDOL LACTATE 5 MG/ML IJ SOLN: 0.5000 mg | INTRAMUSCULAR | Status: AC | PRN

## 2016-10-21 MED ORDER — SODIUM CHLORIDE 0.9 % IV BOLUS (SEPSIS)
250.0000 mL | Freq: Once | INTRAVENOUS | Status: AC
  Administered 2016-10-21: 250 mL via INTRAVENOUS

## 2016-10-21 MED ORDER — HALOPERIDOL LACTATE 2 MG/ML PO CONC: 0.5000 mg | ORAL | 0 refills | Status: AC | PRN

## 2016-10-21 MED ORDER — HALOPERIDOL 0.5 MG PO TABS: 0.5000 mg | ORAL_TABLET | ORAL | Status: AC | PRN

## 2016-10-21 MED ORDER — LORAZEPAM 2 MG/ML IJ SOLN: 1.0000 mg | INTRAMUSCULAR | 0 refills | Status: AC | PRN

## 2016-10-21 MED ORDER — LORAZEPAM 2 MG/ML IJ SOLN: 1.0000 mg | INTRAMUSCULAR | Status: DC | PRN

## 2016-10-21 MED ORDER — POLYVINYL ALCOHOL 1.4 % OP SOLN: 1.0000 [drp] | Freq: Four times a day (QID) | OPHTHALMIC | Status: DC | PRN

## 2016-10-21 MED ORDER — ONDANSETRON HCL 4 MG/2ML IJ SOLN: 4.0000 mg | Freq: Four times a day (QID) | INTRAMUSCULAR | 0 refills | Status: AC | PRN

## 2016-10-21 MED ORDER — HALOPERIDOL LACTATE 5 MG/ML IJ SOLN: 0.5000 mg | INTRAMUSCULAR | Status: DC | PRN

## 2016-10-21 MED ORDER — HALOPERIDOL 1 MG PO TABS: 0.5000 mg | ORAL_TABLET | ORAL | Status: DC | PRN

## 2016-10-21 MED ORDER — ONDANSETRON HCL 4 MG/2ML IJ SOLN: 4.0000 mg | Freq: Four times a day (QID) | INTRAMUSCULAR | Status: DC | PRN

## 2016-10-21 MED ORDER — BIOTENE DRY MOUTH MT LIQD: 15.0000 mL | OROMUCOSAL | Status: DC | PRN

## 2016-10-21 NOTE — Progress Notes (Signed)
[facility]

## 2016-10-21 NOTE — Evaluation (Signed)
SLP Cancellation Note  Patient Details Name: Yisroel RammingRuth B Tieu MRN: [id number] DOB: 02/28/1924   Cancelled treatment:       Reason Eval/Treat Not Completed: Medical issues which prohibited therapy (per family, pt now comfort care and plans to dc to hospice soon, no slp indicated)   Donavan Burnetamara Saleema Weppler, MS [facility] SLP [phone]

## 2016-10-21 NOTE — Progress Notes (Signed)
[facility] Anticipated discharge date: 10/21/16 Family notified: Yes, at bedside Transportation by: PTAR  CSW signing off.  Blenda Nicelylizabeth Treysean Petruzzi LCSW [phone]

## 2016-10-21 NOTE — Progress Notes (Signed)
MD paged to notify that Radiologist requests a call(phone number attached in page) back regarding PT.

## 2016-10-21 NOTE — Discharge Summary (Signed)
[facility]  Physician Discharge Summary   Patient ID: Jeanne Barker MRN: [id number] DOB/AGE: 1923-06-08 83 y.o.  Admit date: 10/19/2016 Discharge date: 10/21/2016  PCP: [name], MD  DISCHARGE DIAGNOSES:  Active Problems:   DMII (diabetes mellitus, type 2) (HCC)   CKD (chronic kidney disease) stage 4, GFR 15-29 ml/min (HCC)   Essential hypertension   Dehydration, mild   Acute hyponatremia   Acute cystitis without hematuria   Stroke-like episode (HCC)   Acute encephalopathy   RECOMMENDATIONS FOR OUTPATIENT FOLLOW UP: Patient being discharged to residential hospice   DISCHARGE CONDITION: poor  Diet recommendation: Comfort feeds as tolerated   INITIAL HISTORY: 83 year old African-American female with a past medical history of recurrent UTIs, anemia, diabetes, hypertension, was recently treated for UTIs initially with Keflex and then with amoxicillin. She presented with slurred speech, generalized weakness and lethargy. She lives at her own home, but her daughters provide around-the-clock care. Initially there was concern for stroke. However, it was felt that her symptoms were due to urinary tract infection. Patient remained encephalopathic. This morning noted to have gaze abnormalities. Repeat CT showed significant hemorrhagic stroke.  Consultants: Neurology  Procedures:   EEG Impression: This EEG is abnormal due to the presence of: 1. Moderatediffuse slowing of the background 2. Additional focal slowing over the right posterior quadrant and right frontoparietal region  Clinical Correlation of the above findings indicates diffuse cerebral dysfunction that is non-specific in etiology and can be seen with hypoxic/ischemic injury, toxic/metabolic encephalopathies, neurodegenerative disorders, or medication effect. Focal slowing indicates focal cerebral dysfunction suggestive of underlying structural or physiologic abnormality in this region. The run of focal  slowing over the right frontoparietal region did not clearly evolve and appears to be related to state change rather than electrographic seizure. The absence of epileptiform discharges does not rule out a clinical diagnosis of epilepsy. Clinical correlation is advised.   HOSPITAL COURSE:   Acute right parenchymal stroke with hemorrhage Initial CT of the head did not show any stroke. Patient remained encephalopathic. CT scan was repeated today which showed large parenchymal hemorrhagic stroke. MRI could not be done as patient has cough. Implant. This is the most likely explanation for patient's encephalopathy. Prognosis is poor. Discussed with neurology. Discussed with family including her daughter. They want comfort care and hospice at this time. Social worker consulted for residential hospice placement. Apparently a bed is available at residential hospice. She will be transported there.  Recurrent UTI She was hospitalized in June. Urine cultures were positive for enterococcus. Initially treated with cephalosporins but then changed over to amoxicillin. Urine cultures sent from the ED was referred for enterococcus. She did have recent renal ultrasound in May which did not show any acute findings. She is noted to have had left nephrectomy for unknown reasons. Initially started on ceftriaxone, which include Zosyn. We'll now discontinue antibiotics based on discussions with family and plan for comfort care.   Acute on Chronic kidney disease, stage 3 Worsening creatinine noted this morning along with mild hyperkalemia. Since comfort care is the mainstay will not pursue this further.   Deafness Has a cochlear implant.  Essential hypertension Elevated blood pressures noted. Comfort care.  Patient to be discharged to residential hospice.   PERTINENT LABS:  The results of significant diagnostics from this hospitalization (including imaging, microbiology, ancillary and laboratory) are listed  below for reference.    Microbiology: Recent Results (from the past 240 hour(s))  Urine culture     Status: Abnormal  Collection Time: 10/19/16  8:36 AM  Result Value Ref Range Status   Specimen Description URINE, CLEAN CATCH  Final   Special Requests NONE  Final   Culture 40,000 COLONIES/mL ENTEROCOCCUS FAECALIS (A)  Final   Report Status 10/21/2016 FINAL  Final   Organism ID, Bacteria ENTEROCOCCUS FAECALIS (A)  Final      Susceptibility   Enterococcus faecalis - MIC*    AMPICILLIN <=2 SENSITIVE Sensitive     LEVOFLOXACIN 1 SENSITIVE Sensitive     NITROFURANTOIN <=16 SENSITIVE Sensitive     VANCOMYCIN 1 SENSITIVE Sensitive     * 40,000 COLONIES/mL ENTEROCOCCUS FAECALIS     Labs: Basic Metabolic Panel:  Recent Labs Lab 10/18/16 1204 10/19/16 0827 10/19/16 0833 10/19/16 1936 10/20/16 0629 10/21/16 0453  NA 128* 131* 131* 134* 135 136  K 5.0 4.6 6.0* 5.0 4.3 5.2*  CL 96 101 100* 102 102 105  CO2 26 24  --  25 23 18*  GLUCOSE 151* 156* 155* 113* 113* 223*  BUN 17 18 29* 15 15 25*  CREATININE 1.41* 1.73* 1.70* 1.40* 1.49* 1.82*  CALCIUM 9.6 9.3  --  9.9 9.5 9.6   Liver Function Tests:  Recent Labs Lab 10/19/16 0827  AST 14*  ALT 10*  ALKPHOS 67  BILITOT 0.7  PROT 6.1*  ALBUMIN 2.9*    Recent Labs Lab 10/19/16 0827  AMMONIA 47*   CBC:  Recent Labs Lab 10/18/16 1204 10/19/16 0827 10/19/16 0833 10/20/16 0629 10/21/16 0453  WBC 5.7 5.5  --  5.4 11.7*  NEUTROABS 3.4 3.3  --   --   --   HGB 10.6* 10.5* 11.2* 10.7* 12.2  HCT 32.3* 32.6* 33.0* 33.4* 36.9  MCV 83.1 84.0  --  83.9 82.6  PLT 369.0 301  --  336 307   CBG:  Recent Labs Lab 10/20/16 0609 10/20/16 1140 10/20/16 1630 10/20/16 2129 10/21/16 0637  GLUCAP 107* 192* 294* 264* 189*     IMAGING STUDIES Ct Angio Head W Or Wo Contrast  Result Date: 09/30/2016 CLINICAL DATA:  83 y/o  F; slurred speech and left-sided weakness. EXAM: CT ANGIOGRAPHY HEAD AND NECK CT PERFUSION BRAIN  TECHNIQUE: Multidetector CT imaging of the head and neck was performed using the standard protocol during bolus administration of intravenous contrast. Multiplanar CT image reconstructions and MIPs were obtained to evaluate the vascular anatomy. Carotid stenosis measurements (when applicable) are obtained utilizing NASCET criteria, using the distal internal carotid diameter as the denominator. Multiphase CT imaging of the brain was performed following IV bolus contrast injection. Subsequent parametric perfusion maps were calculated using RAPID software. CONTRAST:  Cta head and neck 50 cc Isovue 370. CT perfusion 40 cc Isovue 370. COMPARISON:  09/30/2016 CT head FINDINGS: CTA NECK FINDINGS Aortic arch: Standard branching. Imaged portion shows no evidence of aneurysm or dissection. No significant stenosis of the major arch vessel origins. Right carotid system: No evidence of dissection, stenosis (50% or greater) or occlusion. Left carotid system: No evidence of dissection, stenosis (50% or greater) or occlusion. Calcified plaque of the carotid bifurcation with minimal less than 30% stenosis of proximal ICA. Vertebral arteries: Left dominant. No evidence of dissection, stenosis (50% or greater) or occlusion. Skeleton: Grade 1 C4-5 anterolisthesis and prominent C4-5 facet arthropathy. No high-grade bony canal stenosis. Other neck: Negative. Upper chest: Negative. Review of the MIP images confirms the above findings CTA HEAD FINDINGS Anterior circulation: Moderate calcific atherosclerosis of the cavernous and paraclinoid internal carotid artery is with short  segment of mild-to-moderate left-sided paraclinoid stenosis. No significant stenosis, proximal vessel occlusion, or aneurysm. M2 inferior division short segment of mild ectasia measuring 3 mm (series 12, image 14). Posterior circulation: No significant stenosis, proximal occlusion, aneurysm, or vascular malformation. Venous sinuses: As permitted by contrast timing,  patent. Anatomic variants: Right posterior communicating and anterior communicating artery is patent. No left posterior communicating artery identified, hypoplastic or absent. Review of the MIP images confirms the above findings CT Brain Perfusion Findings: Streak artifact from right-sided cochlear implant limits evaluation of right temporal lobe, brainstem, and cerebellum. CBF (<30%) Volume: 0mL Perfusion (Tmax>6.0s) volume: 0mL Mismatch Volume: 0mL Infarction Location:NA. IMPRESSION: 1. Patent circle of Willis. No large vessel occlusion, aneurysm, or significant stenosis is identified. 2. Patent carotid and vertebral arteries. No dissection, aneurysm, or significant stenosis. 3. Streak artifact from right-sided cochlear implant limits evaluation of right temporal lobe, brainstem, and cerebellum on the CT perfusion study. In the visualized portions of the brain there is no perfusion abnormality. 4. Atherosclerosis with calcified plaque of left carotid bifurcation and the carotid siphons. Minimal ectasia of left inferior division M2 is also likely related to atherosclerotic disease. These results were called by telephone at the time of interpretation on 09/30/2016 at 2:38 pm to Dr. Rubin PayorPickering, who verbally acknowledged these results. Electronically Signed   By: Mitzi HansenLance  Furusawa-Stratton M.D.   On: 09/30/2016 14:46   Ct Head Wo Contrast  Result Date: 10/21/2016 CLINICAL DATA:  Change in mental status with rightward gaze. EXAM: CT HEAD WITHOUT CONTRAST TECHNIQUE: Contiguous axial images were obtained from the base of the skull through the vertex without intravenous contrast. COMPARISON:  10/19/2016 FINDINGS: Brain: Interval large hemorrhage involving the majority of the right temporal lobe, at least 6 x 4 x 4 cm (~50 cc volume) there is subdural and intraventricular extension. The subdural clot measures up to 6 mm in thickness. Intraventricular hemorrhage is seen in the right more than left lateral ventricles.  Midline shift is 6 mm. No ventricular entrapment at this time. Mild edema around the hemorrhage, expected. Vascular: Atherosclerotic calcification.  No hyperdense vessel. Skull: Right mastoidectomy for cochlear implant.  No acute finding. Sinuses/Orbits: Negative Critical Value/emergent results were called by telephone at the time of interpretation on 10/21/2016 at 9:35 am to Dr. Rito EhrlichKrishnan, who verbally acknowledged these results. IMPRESSION: Interval large parenchymal hemorrhage in the right temporal lobe, ~50 cc volume. There is right subdural and intraventricular extension. Midline shift is 6 mm. Electronically Signed   By: Marnee SpringJonathon  Watts M.D.   On: 10/21/2016 09:36   Ct Head Wo Contrast  Result Date: 10/02/2016 CLINICAL DATA:  Altered mental status. EXAM: CT HEAD WITHOUT CONTRAST TECHNIQUE: Contiguous axial images were obtained from the base of the skull through the vertex without intravenous contrast. COMPARISON:  Head CT/CTA/ perfusion 09/30/2016 FINDINGS: Brain: Extensive metallic streak artifact from a right-sided cochlear implant again obscures much of the right temporal and occipital lobes and some of the right cerebellum and brainstem. Within this limitation, no acute infarct, intracranial hemorrhage, mass, midline shift, or extra-axial fluid collection is identified. Mild age-appropriate cerebral atrophy is present. Periventricular white matter hypodensities are unchanged and compatible with mild chronic small vessel ischemic disease. Vascular: Calcified atherosclerosis at the skullbase. No hyperdense vessel. Skull: No fracture focal osseous lesion. Prior canal wall up mastoidectomy on the right. Sinuses/Orbits: Clear paranasal sinuses. Clear mastoid air cells. Bilateral cataract extraction. Other: None. IMPRESSION: No acute intracranial abnormality identified within limitations of artifact from cochlear implant. Electronically Signed  By: Sebastian Ache M.D.   On: 10/02/2016 13:05   Ct Angio Neck W  Or Wo Contrast  Result Date: 09/30/2016 CLINICAL DATA:  83 y/o  F; slurred speech and left-sided weakness. EXAM: CT ANGIOGRAPHY HEAD AND NECK CT PERFUSION BRAIN TECHNIQUE: Multidetector CT imaging of the head and neck was performed using the standard protocol during bolus administration of intravenous contrast. Multiplanar CT image reconstructions and MIPs were obtained to evaluate the vascular anatomy. Carotid stenosis measurements (when applicable) are obtained utilizing NASCET criteria, using the distal internal carotid diameter as the denominator. Multiphase CT imaging of the brain was performed following IV bolus contrast injection. Subsequent parametric perfusion maps were calculated using RAPID software. CONTRAST:  Cta head and neck 50 cc Isovue 370. CT perfusion 40 cc Isovue 370. COMPARISON:  09/30/2016 CT head FINDINGS: CTA NECK FINDINGS Aortic arch: Standard branching. Imaged portion shows no evidence of aneurysm or dissection. No significant stenosis of the major arch vessel origins. Right carotid system: No evidence of dissection, stenosis (50% or greater) or occlusion. Left carotid system: No evidence of dissection, stenosis (50% or greater) or occlusion. Calcified plaque of the carotid bifurcation with minimal less than 30% stenosis of proximal ICA. Vertebral arteries: Left dominant. No evidence of dissection, stenosis (50% or greater) or occlusion. Skeleton: Grade 1 C4-5 anterolisthesis and prominent C4-5 facet arthropathy. No high-grade bony canal stenosis. Other neck: Negative. Upper chest: Negative. Review of the MIP images confirms the above findings CTA HEAD FINDINGS Anterior circulation: Moderate calcific atherosclerosis of the cavernous and paraclinoid internal carotid artery is with short segment of mild-to-moderate left-sided paraclinoid stenosis. No significant stenosis, proximal vessel occlusion, or aneurysm. M2 inferior division short segment of mild ectasia measuring 3 mm (series 12,  image 14). Posterior circulation: No significant stenosis, proximal occlusion, aneurysm, or vascular malformation. Venous sinuses: As permitted by contrast timing, patent. Anatomic variants: Right posterior communicating and anterior communicating artery is patent. No left posterior communicating artery identified, hypoplastic or absent. Review of the MIP images confirms the above findings CT Brain Perfusion Findings: Streak artifact from right-sided cochlear implant limits evaluation of right temporal lobe, brainstem, and cerebellum. CBF (<30%) Volume: 0mL Perfusion (Tmax>6.0s) volume: 0mL Mismatch Volume: 0mL Infarction Location:NA. IMPRESSION: 1. Patent circle of Willis. No large vessel occlusion, aneurysm, or significant stenosis is identified. 2. Patent carotid and vertebral arteries. No dissection, aneurysm, or significant stenosis. 3. Streak artifact from right-sided cochlear implant limits evaluation of right temporal lobe, brainstem, and cerebellum on the CT perfusion study. In the visualized portions of the brain there is no perfusion abnormality. 4. Atherosclerosis with calcified plaque of left carotid bifurcation and the carotid siphons. Minimal ectasia of left inferior division M2 is also likely related to atherosclerotic disease. These results were called by telephone at the time of interpretation on 09/30/2016 at 2:38 pm to Dr. Rubin Payor, who verbally acknowledged these results. Electronically Signed   By: Mitzi Hansen M.D.   On: 09/30/2016 14:46   Ct Cerebral Perfusion W Contrast  Result Date: 09/30/2016 CLINICAL DATA:  83 y/o  F; slurred speech and left-sided weakness. EXAM: CT ANGIOGRAPHY HEAD AND NECK CT PERFUSION BRAIN TECHNIQUE: Multidetector CT imaging of the head and neck was performed using the standard protocol during bolus administration of intravenous contrast. Multiplanar CT image reconstructions and MIPs were obtained to evaluate the vascular anatomy. Carotid stenosis  measurements (when applicable) are obtained utilizing NASCET criteria, using the distal internal carotid diameter as the denominator. Multiphase CT imaging of the brain was  performed following IV bolus contrast injection. Subsequent parametric perfusion maps were calculated using RAPID software. CONTRAST:  Cta head and neck 50 cc Isovue 370. CT perfusion 40 cc Isovue 370. COMPARISON:  09/30/2016 CT head FINDINGS: CTA NECK FINDINGS Aortic arch: Standard branching. Imaged portion shows no evidence of aneurysm or dissection. No significant stenosis of the major arch vessel origins. Right carotid system: No evidence of dissection, stenosis (50% or greater) or occlusion. Left carotid system: No evidence of dissection, stenosis (50% or greater) or occlusion. Calcified plaque of the carotid bifurcation with minimal less than 30% stenosis of proximal ICA. Vertebral arteries: Left dominant. No evidence of dissection, stenosis (50% or greater) or occlusion. Skeleton: Grade 1 C4-5 anterolisthesis and prominent C4-5 facet arthropathy. No high-grade bony canal stenosis. Other neck: Negative. Upper chest: Negative. Review of the MIP images confirms the above findings CTA HEAD FINDINGS Anterior circulation: Moderate calcific atherosclerosis of the cavernous and paraclinoid internal carotid artery is with short segment of mild-to-moderate left-sided paraclinoid stenosis. No significant stenosis, proximal vessel occlusion, or aneurysm. M2 inferior division short segment of mild ectasia measuring 3 mm (series 12, image 14). Posterior circulation: No significant stenosis, proximal occlusion, aneurysm, or vascular malformation. Venous sinuses: As permitted by contrast timing, patent. Anatomic variants: Right posterior communicating and anterior communicating artery is patent. No left posterior communicating artery identified, hypoplastic or absent. Review of the MIP images confirms the above findings CT Brain Perfusion Findings: Streak  artifact from right-sided cochlear implant limits evaluation of right temporal lobe, brainstem, and cerebellum. CBF (<30%) Volume: 0mL Perfusion (Tmax>6.0s) volume: 0mL Mismatch Volume: 0mL Infarction Location:NA. IMPRESSION: 1. Patent circle of Willis. No large vessel occlusion, aneurysm, or significant stenosis is identified. 2. Patent carotid and vertebral arteries. No dissection, aneurysm, or significant stenosis. 3. Streak artifact from right-sided cochlear implant limits evaluation of right temporal lobe, brainstem, and cerebellum on the CT perfusion study. In the visualized portions of the brain there is no perfusion abnormality. 4. Atherosclerosis with calcified plaque of left carotid bifurcation and the carotid siphons. Minimal ectasia of left inferior division M2 is also likely related to atherosclerotic disease. These results were called by telephone at the time of interpretation on 09/30/2016 at 2:38 pm to Dr. Rubin Payor, who verbally acknowledged these results. Electronically Signed   By: Mitzi Hansen M.D.   On: 09/30/2016 14:46   Dg Chest Port 1 View  Result Date: 10/19/2016 CLINICAL DATA:  Code stroke,left sided weakness, confusion and slurred speech. Hx htn,dm EXAM: PORTABLE CHEST - 1 VIEW COMPARISON:  09/30/2016 FINDINGS: Relatively low lung volumes with mild interstitial prominence as before. No confluent airspace disease. Heart size and mediastinal contours are within normal limits. No effusion. Visualized bones unremarkable. IMPRESSION: Low volumes.  No acute disease. Electronically Signed   By: Corlis Leak M.D.   On: 10/19/2016 09:39   Dg Chest Port 1 View  Result Date: 09/30/2016 CLINICAL DATA:  Mental status changes for 3 weeks, a onset of confusion and difficulty speaking and 1100 hours today EXAM: PORTABLE CHEST 1 VIEW COMPARISON:  Portable exam 1723 hours compared to 08/06/2016 FINDINGS: Normal heart size, mediastinal contours, and pulmonary vascularity. Minimal bibasilar  atelectasis. Lungs otherwise clear. No pleural effusion or pneumothorax. Bones demineralized. IMPRESSION: Minimal bibasilar atelectasis. Electronically Signed   By: Ulyses Southward M.D.   On: 09/30/2016 17:30   Ct Head Code Stroke Wo Contrast`  Addendum Date: 10/19/2016   ADDENDUM REPORT: 10/19/2016 08:30 ADDENDUM: These results were called by telephone at the time of  interpretation on 10/19/2016 at 8:28 am to Dr. Wilford Corner, who verbally acknowledged these results. Electronically Signed   By: Marin Roberts M.D.   On: 10/19/2016 08:30   Result Date: 10/19/2016 CLINICAL DATA:  Code stroke. Sudden onset of confusion. Unable to walk. EXAM: CT HEAD WITHOUT CONTRAST TECHNIQUE: Contiguous axial images were obtained from the base of the skull through the vertex without intravenous contrast. COMPARISON:  CT head without contrast 10/02/2016. FINDINGS: Brain: Significant streak artifact from the cochlear implant is again noted. No acute infarct, hemorrhage, or mass lesion is present. Basal ganglia are unremarkable. The insular ribbon is normal bilaterally. The ventricles are proportionate to the degree of atrophy. Mild white matter changes are stable. Vascular: Atherosclerotic calcifications are again noted within the cavernous internal carotid artery's bilaterally. There is no hyperdense vessel. Skull: The right mastoidectomy is noted. The calvarium is otherwise intact. No focal lytic or blastic lesions are present. The skullbase is within normal limits. Sinuses/Orbits: The paranasal sinuses clear. Bilateral lens replacements noted. The globes and orbits are otherwise unremarkable. ASPECTS [facility] Stroke Program Early CT Score) - Ganglionic level infarction (caudate, lentiform nuclei, internal capsule, insula, M1-M3 cortex): 7/7 - Supraganglionic infarction (M4-M6 cortex): 3/3 Total score (0-10 with 10 being normal): 10/10 IMPRESSION: 1. No acute intracranial abnormality or significant interval change. 2. Stable atrophy  white matter disease. This likely reflects the sequela of chronic microvascular ischemia. 3. ASPECTS is 10/10 Electronically Signed: By: Marin Roberts M.D. On: 10/19/2016 08:27   Ct Head Code Stroke W/o Cm  Result Date: 09/30/2016 CLINICAL DATA:  Code stroke.  Slurred speech and left-sided weakness EXAM: CT HEAD WITHOUT CONTRAST TECHNIQUE: Contiguous axial images were obtained from the base of the skull through the vertex without intravenous contrast. COMPARISON:  CT 08/06/2016 FINDINGS: Brain: Streak artifact from a right-sided cochlear implant obscures much of the posterior fossa and both temporal and occipital lobes.No mass lesion, intraparenchymal hemorrhage or extra-axial collection. No evidence of acute cortical infarct. There is mild for age periventricular hypoattenuation compatible with chronic microvascular disease. Vascular: No hyperdense vessel. Bilateral atherosclerotic calcification of the carotid arteries at skullbase. Skull: Normal visualized skull base, calvarium and extracranial soft tissues. Sinuses/Orbits: No sinus fluid levels or advanced mucosal thickening. No mastoid effusion. Normal orbits. ASPECTS [facility] Stroke Program Early CT Score) - Ganglionic level infarction (caudate, lentiform nuclei, internal capsule, insula, M1-M3 cortex): 7 - Supraganglionic infarction (M4-M6 cortex): 3 Total score (0-10 with 10 being normal): 10 IMPRESSION: 1. No acute intracranial abnormality. Streak artifact from right-sided cochlear implant limits assessment of the posterior fossa and right temporal lobe. 2. ASPECTS is 10. These results were called by telephone at the time of interpretation on 09/30/2016 at 2:01 pm to Dr. Geoffery Lyons , who verbally acknowledged these results. Electronically Signed   By: Deatra Robinson M.D.   On: 09/30/2016 14:07    DISCHARGE EXAMINATION: See progress note from earlier today   ALLERGIES:  Allergies  Allergen Reactions  . Amlodipine Besylate Nausea Only  .  Lisinopril Other (See Comments)    REACTION: hyperkalemia     Current Discharge Medication List    START taking these medications   Details  antiseptic oral rinse (BIOTENE) LIQD Apply 15 mLs topically as needed for dry mouth.    !! glycopyrrolate (ROBINUL) 0.2 MG/ML injection Inject 1 mL (0.2 mg total) into the skin every 4 (four) hours as needed (excessive secretions). Qty: 1 mL    !! glycopyrrolate (ROBINUL) 0.2 MG/ML injection Inject 1 mL (0.2 mg  total) into the vein every 4 (four) hours as needed (excessive secretions). Qty: 1 mL    glycopyrrolate (ROBINUL) 1 MG tablet Take 1 tablet (1 mg total) by mouth every 4 (four) hours as needed (excessive secretions).    haloperidol (HALDOL) 0.5 MG tablet Take 1 tablet (0.5 mg total) by mouth every 4 (four) hours as needed for agitation (or delirium).    haloperidol (HALDOL) 2 MG/ML solution Place 0.3 mLs (0.6 mg total) under the tongue every 4 (four) hours as needed for agitation (or delirium). Refills: 0    haloperidol lactate (HALDOL) 5 MG/ML injection Inject 0.1 mLs (0.5 mg total) into the vein every 4 (four) hours as needed (or delirium). Qty: 1 mL    LORazepam (ATIVAN) 1 MG tablet Take 1 tablet (1 mg total) by mouth every 4 (four) hours as needed for anxiety. Qty: 30 tablet, Refills: 0    LORazepam (ATIVAN) 2 MG/ML concentrated solution Place 0.5 mLs (1 mg total) under the tongue every 4 (four) hours as needed for anxiety. Qty: 30 mL, Refills: 0    LORazepam (ATIVAN) 2 MG/ML injection Inject 0.5 mLs (1 mg total) into the vein every 4 (four) hours as needed for anxiety. Qty: 1 mL, Refills: 0    morphine 2 MG/ML injection Inject 0.5 mLs (1 mg total) into the vein every 2 (two) hours as needed (or dyspnea). Qty: 1 mL, Refills: 0    ondansetron (ZOFRAN) 4 MG/2ML SOLN injection Inject 2 mLs (4 mg total) into the vein every 6 (six) hours as needed for nausea. Qty: 2 mL, Refills: 0    ondansetron (ZOFRAN-ODT) 4 MG disintegrating  tablet Take 1 tablet (4 mg total) by mouth every 6 (six) hours as needed for nausea. Qty: 20 tablet, Refills: 0    polyvinyl alcohol (LIQUIFILM TEARS) 1.4 % ophthalmic solution Place 1 drop into both eyes 4 (four) times daily as needed for dry eyes. Qty: 15 mL, Refills: 0     !! - Potential duplicate medications found. Please discuss with provider.    STOP taking these medications     aspirin 81 MG tablet      cholecalciferol (VITAMIN D) 1000 units tablet      cloNIDine (CATAPRES) 0.1 MG tablet      nortriptyline (PAMELOR) 10 MG capsule      sitaGLIPtin (JANUVIA) 50 MG tablet      vitamin B-12 (CYANOCOBALAMIN) 100 MCG tablet      Vitamin D, Ergocalciferol, (DRISDOL) 50000 units CAPS capsule      Diclofenac Sodium 3 % CREA          TOTAL DISCHARGE TIME: 35 mins  [facility]  [facility] Pager [phone]  10/21/2016, 4:10 PM

## 2016-10-21 NOTE — Discharge Instructions (Signed)
End-of-Life Care  What is end-of-life care?  End-of-life care is the physical, emotional, mental, and spiritual care you receive during the days, weeks, or months while you are dying. Your end-of-life care team may include:   Health care providers.   A social worker.   A spiritual adviser.    The goal of end-of-life care is to give you the highest quality of life possible at the end of your life.  What are the different types of end-of-life care?  There are several different kinds of care options.  Palliative care  This type of care does not treat or cure a disease. The goal is to manage your symptoms. These may include:   Pain.   Constipation.   Nausea.    Palliative care teams often also include support for family members and loved ones. You might need palliative care for months or years.  Hospice care  This is a kind of palliative care ordered and provided by your health care providers. A hospice care team can also help and support your loved ones.  Comfort care  This type of care is for meeting your basic needs and maintaining your overall comfort at the end of your life. This includes caring for your:   Skin.   Breathing.   Nutrition.   Rest.   Temperature.    A plan for comfort care can also address the mental, emotional, and spiritual issues that may come up at the end of your life.  Where does end-of-life care take place?  End-of-life care can take place wherever you are living, as long as you get the care you need. End-of-life care can happen:   At your home.   In a nursing home.   In a hospital.   In a critical care unit.    You and your loved ones might be able to decide where end-of-life care takes place. This decision depends on:   Your comfort.   Your wishes.   The medical equipment you need.    How do I know when it is time for end-of-life care?  Your health care provider might tell you that there are no more treatments left to try or that treatment can no longer control your  illness. Or, you may decide that you do not want to undergo the treatments that are available. Talk to your health care provider and your loved ones about your end-of-life care options. If possible, have this conversation before you need this type of care. Discuss:   How much medical treatment you want during end-of-life care.   Where you would like to live while you are dying.   What kinds of treatments you would accept to keep you comfortable.   Which treatments you would refuse.   Your faith or spiritual needs at the end of your life.   Who will handle practical details, such as wills and finances.    You can create legal documents (advance directives) to let your loved ones know your wishes for end-of-life care. Talk to your health care provider or a lawyer about making a living will that explains your medical wishes. You can also have a medical power of attorney. This designates a person to make health decisions for you if you cannot make them yourself.  This information is not intended to replace advice given to you by your health care provider. Make sure you discuss any questions you have with your health care provider.  Document Released: 10/23/2013 Document Revised: 03/11/2016   Document Reviewed: 06/21/2013  Elsevier Interactive Patient Education  2017 [employer]vier Inc.

## 2016-10-21 NOTE — Telephone Encounter (Signed)
FYI

## 2016-10-21 NOTE — Care Management Note (Signed)
Case Management Note  Patient Details  Name: Jeanne Barker MRN: [id number] Date of Birth: [birth year]  Subjective/Objective:                    Action/Plan: CSW consulted for residential hospice. CM assisted and met with family and provided a list of residential hospice facilities. The family selected [facility]. Kathlee Nations with CSW and Amy with [facility] updated.  Plan is for patient to d/c to [facility]. No further needs per CM.  Expected Discharge Date:                  Expected Discharge Plan:  [place]  In-House Referral:  Clinical Social Work  Discharge planning Services  CM Consult  Post Acute Care Choice:    Choice offered to:     DME Arranged:    DME Agency:     HH Arranged:    [place] Agency:     Status of Service:  Completed, signed off  If discussed at [employer], dates discussed:    Additional Comments:  Pollie Friar, RN 10/21/2016, 4:01 PM

## 2016-10-21 NOTE — Progress Notes (Signed)
T 101. Administered tylenol suppository. Rechecked 100.2. Apply cool cloth. Uncover blanket. NP Craige CottaKirby made aware.

## 2016-10-21 NOTE — Progress Notes (Signed)
Subjective: Cachectic woman laying in bed. Right gaze deviation that does not appear to cross mid-line. Daughter at bedside. Non-responsive to verbal cues but does withdraw to pain.  Physical Examination: Vitals:   10/21/16 0514 10/21/16 0922  BP: (!) 169/65 (!) 150/90  Pulse: (!) 114 73  Resp: 14 17  Temp: (!) 102 F (38.9 C) 99.8 F (37.7 C)    General: WDWN female.  HEENT:  Normocephalic, no lesions, without obvious abnormality.  Normal external eye and conjunctiva.  Normal external ears. Normal external nose, mucus membranes and septum.  Normal pharynx. Cardiovascular: S1, S2 normal, pulses palpable throughout   Pulmonary: chest clear, no wheezing, rales, normal symmetric air entry Abdomen: Soft Extremities: no joint deformities, effusion, or inflammation and no edema Musculoskeletal: no joint tenderness, deformity or swelling Tone and bulk:normal tone throughout; no atrophy noted Skin: warm and dry, no hyperpigmentation, vitiligo, or suspicious lesions  Neurological Examination:  CN: Pupils are equal, round and symmetrically reactive. Right gaze deviation that does not cross midline with passive head turn is concerning for a right hemispheric stroke or left hemispheric seizure. Motor: Does not withdraw to pain. Cachectic Sensation: As above DTRs: 1+ BUE. Dropped BLE Coordination: She does not perform.   Assessment and Recommendations 83 year old woman who presented yesterday with symptoms suspicious for stroke v metabolic encephalopathy. CT negative at that time and unable to get an MRI due to cochlear implant. Repeat CT today shows a devastating head bleed. It is highly unlikely that she will regain meaningful interactive function at this point. I would suggest getting Palliative medicine involved for a goals of care meeting with the family.   Bruna PotterJamie Aldridge PA-C [facility] [phone]  10/21/2016, 10:04 AM   NEUROHOSPITALIST ADDENDUM Patient seen and  examined this AM. Patient had a right gaze preference that was forced. Suspicion for seizure v. Stroke. Stat CTH orderd. Stat EEG also ordered. CTH showed a large parenchymal bleed in the right temporal region with IVH. ICH score: 4  Findings d/w primary Dr. Rito EhrlichKrishnan and patient's daughter at bedside. Primary team had a family meeting and family decided to make her DNR and pursue comfort measures. At this time, neurology will sign off. Please call us with questions.  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 Altered mental status  Stroke metrics NIHSS 1a Level of Conscious.: 2 1b LOC Questions: 2 1c LOC Commands: 2 2 Best Gaze: 2 3 Visual: 2 4 Facial Palsy:2  5a Motor Arm - left:3  5b Motor Arm - Right:3  6a Motor Leg - Left: 3 6b Motor Leg - Right:3  7 Limb Ataxia: 0 8 Sensory: 0 9 Best Language:3  10 Dysarthria: 2 11 Extinct. and Inatten.:0  TOTAL: 29  ICH Score: 4.   Milon DikesAshish Miyonna Ormiston, MD [facility] [phone]  If 7pm to 7am, please call on call as listed on AMION.

## 2016-10-21 NOTE — Progress Notes (Signed)
EEG Completed; Results Pending  

## 2016-10-21 NOTE — Progress Notes (Signed)
Pt has been laying on right side by daughter over night. Pt appears to gaze more on rt side. Pt was repositioned on left side but her head and eyes seem to focus on the right side. Pt able to feel pinch on rt UE with an "ouch" but on left UE didn't feel the pinch. Neurologist made aware.

## 2016-10-21 NOTE — Progress Notes (Signed)
Jeanne Barker's family has decided to make her DNR, stating "we dont want her to suffer".  They asked about hospice. MD notified. Will continue to monitor.

## 2016-10-21 NOTE — Telephone Encounter (Signed)
Spoke w/ Mollie, informed of recommendations. Instructed her to let us know if she needs anything. Mollie verbalized understanding and wanted to thank PCP for everything over the years.

## 2016-10-21 NOTE — Telephone Encounter (Signed)
Chart reviewed, I agreed with the care, DO NOT RESUSCITATE and consult  hospice is a very good decision. Please let her know.

## 2016-10-21 NOTE — Clinical Social Work Note (Addendum)
Clinical Social Work Assessment  Patient Details  Name: Jeanne Barker MRN: [id number] Date of Birth: 06/15/1923  Date of referral:  10/21/16               Reason for consult:  Facility Placement, Discharge Planning                Permission sought to share information with:  Facility Medical sales representativeContact Representative, Family Supports Permission granted to share information::  Yes, Verbal Permission Granted  Name::     Laurena SlimmerMollie, Shirley  Ag[facility]  [facility]  Relationship::  Daughters  Contact Information:     Housing/Transportation Living arrangements for the past 2 months:  Single Family Home Source of Information:  Adult Children Patient Interpreter Needed:  None Criminal Activity/Legal Involvement Pertinent to Current Situation/Hospitalization:  No - Comment as needed Significant Relationships:  Adult Children, Other Family Members Lives with:  Self Do you feel safe going back to the place where you live?  Yes Need for family participation in patient care:  Yes (Comment) (patient is nonresponsive)  Care giving concerns:  Patient is at end of life and family requesting residential hospice.   Social Worker assessment / plan:  CSW introduced self to patient's daughters at bedside and [facility]. CSW informed patient's daughters and set up transport.  Employment status:  Retired Health and safety inspectornsurance information:  Medicare PT Recommendations:  Not assessed at this time Information / Referral to community resources:     Patient/Family's Response to care:  Patient's family agreeable to transition to [facility].  Patient/Family's Understanding of and Emotional Response to Diagnosis, Current Treatment, and Prognosis:  Patient's daughters seem to understand the need for end of life care and were appreciative of the care received at the hospital.  Emotional Assessment Appearance:     Attitude/Demeanor/Rapport:    Affect (typically observed):    Orientation:   (nonresponsive) Alcohol / Substance use:  Not Applicable Psych involvement (Current and /or in the community):  No (Comment)  Discharge Needs  Concerns to be addressed:  Care Coordination, Discharge Planning Concerns Readmission within the last 30 days:  Yes Current discharge risk:  Terminally ill Barriers to Discharge:  No Barriers Identified   Jeanne Lenislizabeth M Darril Patriarca, LCSW 10/21/2016, 4:56 PM

## 2016-10-21 NOTE — Progress Notes (Addendum)
[facility] PROGRESS NOTE  Jeanne Barker ZOX:[id number] DOB: 17-Apr-1923 DOA: 10/19/2016  PCP: [name], MD  Brief History/Interval Summary: 83 year old African-American female with a past medical history of recurrent UTIs, anemia, diabetes, hypertension, was recently treated for UTIs initially with Keflex and then with amoxicillin. She presented with slurred speech, generalized weakness and lethargy. She lives at her own home, but her daughters provide around-the-clock care. Initially there was concern for stroke. However, it was felt that her symptoms were due to urinary tract infection. Patient remained encephalopathic. This morning noted to have gaze abnormalities. Repeat CT showed significant hemorrhagic stroke.  Reason for Visit: Acute encephalopathy  Consultants: Neurology  Procedures:   EEG Impression: This EEG is abnormal due to the presence of: 1. Moderate diffuse slowing of the background 2. Additional focal slowing over the right posterior quadrant and right frontoparietal region  Clinical Correlation of the above findings indicates diffuse cerebral dysfunction that is non-specific in etiology and can be seen with hypoxic/ischemic injury, toxic/metabolic encephalopathies, neurodegenerative disorders, or medication effect. Focal slowing indicates focal cerebral dysfunction suggestive of underlying structural or physiologic abnormality in this region. The run of focal slowing over the right frontoparietal region did not clearly evolve and appears to be related to state change rather than electrographic seizure. The absence of epileptiform discharges does not rule out a clinical diagnosis of epilepsy.  Clinical correlation is advised.   Antibiotics: Ceftriaxone was changed over to Zosyn.  Subjective/Interval History: Patient remains confused, lethargic. Does not respond to tactile stimuli. She has significant hearing impairment and has a cochlear implant at baseline.  Her daughter is at the bedside.   ROS: Unable to do  Objective:  Vital Signs  Vitals:   10/21/16 0046 10/21/16 0205 10/21/16 0514 10/21/16 0922  BP: (!) 182/66  (!) 169/65 (!) 150/90  Pulse: (!) 116  (!) 114 73  Resp: 20  14 17   Temp: (!) 101.5 F (38.6 C) 100.2 F (37.9 C) (!) 102 F (38.9 C) 99.8 F (37.7 C)  TempSrc: Axillary  Oral Oral  SpO2: 99%  100% 98%  Weight:      Height:       No intake or output data in the 24 hours ending 10/21/16 1510 Filed Weights   10/19/16 0800  Weight: 59.2 kg (130 lb 8.2 oz)    General appearance: Lethargic. Nonresponsive. Resp: Diminished air entry at the bases. No crackles, wheezing or rhonchi Cardio: S1, S2 normal. Regular. No S3, S4. No rubs, murmurs, bruit GI: Abdomen is soft. Nontender, nondistended. Bowel sounds present. No masses or organomegaly Extremities: No edema Neurological: Unresponsive. Some grimacing with painful stimuli.   Lab Results:  Data Reviewed: I have personally reviewed following labs and imaging studies  CBC:  Recent Labs Lab 10/18/16 1204 10/19/16 0827 10/19/16 0833 10/20/16 0629 10/21/16 0453  WBC 5.7 5.5  --  5.4 11.7*  NEUTROABS 3.4 3.3  --   --   --   HGB 10.6* 10.5* 11.2* 10.7* 12.2  HCT 32.3* 32.6* 33.0* 33.4* 36.9  MCV 83.1 84.0  --  83.9 82.6  PLT 369.0 301  --  336 307    Basic Metabolic Panel:  Recent Labs Lab 10/18/16 1204 10/19/16 0827 10/19/16 0833 10/19/16 1936 10/20/16 0629 10/21/16 0453  NA 128* 131* 131* 134* 135 136  K 5.0 4.6 6.0* 5.0 4.3 5.2*  CL 96 101 100* 102 102 105  CO2 26 24  --  25 23 18*  GLUCOSE 151* 156*  155* 113* 113* 223*  BUN 17 18 29* 15 15 25*  CREATININE 1.41* 1.73* 1.70* 1.40* 1.49* 1.82*  CALCIUM 9.6 9.3  --  9.9 9.5 9.6    GFR: Estimated Creatinine Clearance: 16.2 mL/min (A) (by C-G formula based on SCr of 1.82 mg/dL (H)).  Liver Function Tests:  Recent Labs Lab 10/19/16 0827  AST 14*  ALT 10*  ALKPHOS 67  BILITOT 0.7  PROT  6.1*  ALBUMIN 2.9*     Recent Labs Lab 10/19/16 0827  AMMONIA 47*    CBG:  Recent Labs Lab 10/20/16 0609 10/20/16 1140 10/20/16 1630 10/20/16 2129 10/21/16 0637  GLUCAP 107* 192* 294* 264* 189*    Lipid Profile:  Recent Labs  10/20/16 0629  CHOL 313*  HDL 108  LDLCALC 189*  TRIG 79  CHOLHDL 2.9     Recent Results (from the past 240 hour(s))  Urine culture     Status: Abnormal   Collection Time: 10/19/16  8:36 AM  Result Value Ref Range Status   Specimen Description URINE, CLEAN CATCH  Final   Special Requests NONE  Final   Culture 40,000 COLONIES/mL ENTEROCOCCUS FAECALIS (A)  Final   Report Status 10/21/2016 FINAL  Final   Organism ID, Bacteria ENTEROCOCCUS FAECALIS (A)  Final      Susceptibility   Enterococcus faecalis - MIC*    AMPICILLIN <=2 SENSITIVE Sensitive     LEVOFLOXACIN 1 SENSITIVE Sensitive     NITROFURANTOIN <=16 SENSITIVE Sensitive     VANCOMYCIN 1 SENSITIVE Sensitive     * 40,000 COLONIES/mL ENTEROCOCCUS FAECALIS      Radiology Studies: Ct Head Wo Contrast  Result Date: 10/21/2016 CLINICAL DATA:  Change in mental status with rightward gaze. EXAM: CT HEAD WITHOUT CONTRAST TECHNIQUE: Contiguous axial images were obtained from the base of the skull through the vertex without intravenous contrast. COMPARISON:  10/19/2016 FINDINGS: Brain: Interval large hemorrhage involving the majority of the right temporal lobe, at least 6 x 4 x 4 cm (~50 cc volume) there is subdural and intraventricular extension. The subdural clot measures up to 6 mm in thickness. Intraventricular hemorrhage is seen in the right more than left lateral ventricles. Midline shift is 6 mm. No ventricular entrapment at this time. Mild edema around the hemorrhage, expected. Vascular: Atherosclerotic calcification.  No hyperdense vessel. Skull: Right mastoidectomy for cochlear implant.  No acute finding. Sinuses/Orbits: Negative Critical Value/emergent results were called by telephone  at the time of interpretation on 10/21/2016 at 9:35 am to Dr. Rito Ehrlich, who verbally acknowledged these results. IMPRESSION: Interval large parenchymal hemorrhage in the right temporal lobe, ~50 cc volume. There is right subdural and intraventricular extension. Midline shift is 6 mm. Electronically Signed   By: Marnee Spring M.D.   On: 10/21/2016 09:36     Medications:  Scheduled:  Continuous: . sodium chloride 10 mL/hr at 10/21/16 1130   ZDG:[id number] oral rinse, glycopyrrolate **OR** glycopyrrolate **OR** glycopyrrolate, haloperidol **OR** haloperidol **OR** haloperidol lactate, LORazepam **OR** LORazepam **OR** LORazepam, morphine injection, ondansetron **OR** ondansetron (ZOFRAN) IV, polyvinyl alcohol  Assessment/Plan:  Active Problems:   DMII (diabetes mellitus, type 2) (HCC)   CKD (chronic kidney disease) stage 4, GFR 15-29 ml/min (HCC)   Essential hypertension   Dehydration, mild   Acute hyponatremia   Acute cystitis without hematuria   Stroke-like episode (HCC)   Acute encephalopathy    Acute right parenchymal stroke with hemorrhage Initial CT of the head did not show any stroke. Patient remained encephalopathic. CT scan was  repeated today which showed large parenchymal hemorrhagic stroke. MRI could not be done as patient has cough. Implant. This is the most likely explanation for patient's encephalopathy. Prognosis is poor. Discussed with neurology. Discussed with family including her daughter. They want comfort care and hospice at this time. We will discontinue nonessential medications. End-of-life order set has been initiated. Social worker consulted for residential hospice placement.   Recurrent UTI She was hospitalized in June. Urine cultures were positive for enterococcus. Initially treated with cephalosporins but then changed over to amoxicillin. Urine cultures sent from the ED was referred for enterococcus. She did have recent renal ultrasound in May which did not show  any acute findings. She is noted to have had left nephrectomy for unknown reasons. Initially started on ceftriaxone, which include Zosyn. We'll discontinue antibiotics based on family discussions and plan for comfort care.   Acute on Chronic kidney disease, stage 3 Worsening creatinine noted this morning along with mild hyperkalemia. Since comfort care is the mainstay will not pursue this further.   Deafness Has a cochlear implant.  Essential hypertension Elevated blood pressures noted. Comfort care.  DVT Prophylaxis: Comfort care. Stop heparin.  Code Status: DO NOT RESUSCITATE  Family Communication: Discussed with daughter  Disposition Plan: Management as outlined above. Could be a good candidate for residential hospice if she remains stable over the next 24-48 hours. Prognosis is poor and anticipate death in the next few days.    LOS: 1 day   [facility] Psychiatric CenterKRISHNAN,Jeanne Aderhold  [facility] Pager [phone] 10/21/2016, 3:10 PM  If 7PM-7AM, please contact night-coverage at [email or web address], password [email or web address]

## 2016-10-21 NOTE — Procedures (Addendum)
ELECTROENCEPHALOGRAM REPORT  Date of Study: 10/21/2016  Patient's Name: Jeanne RammingRuth B Barker MRN: [id number] Date of Birth: December 31, 1923  Referring Provider: Dr. Milon DikesAshish Arora  Clinical History: This is a 83 year old woman with altered mental status, right intracranial hemorrhage.  Medications: acetaminophen (TYLENOL) tablet 650 mg  Diclofenac Sodium 3 % CREA 2 g  haloperidol lactate (HALDOL) injection 2 mg  hydrALAZINE (APRESOLINE) injection 5-10 mg  insulin aspart (novoLOG) injection 0-5 Units  insulin aspart (novoLOG) injection 0-9 Units  insulin aspart (novoLOG) injection 3 Units  ondansetron (ZOFRAN) injection 4 mg  ondansetron (ZOFRAN) tablet 4 mg  piperacillin-tazobactam (ZOSYN) IVPB 3.375 g   Technical Summary: A multichannel digital EEG recording measured by the international 10-20 system with electrodes applied with paste and impedances below 5000 ohms performed as portable with EKG monitoring in an unresponsive patient.  Hyperventilation and photic stimulation were not performed.  The digital EEG was referentially recorded, reformatted, and digitally filtered in a variety of bipolar and referential montages for optimal display.   Description: The patient is unresponsive during the recording. There is no clear posterior dominant rhythm. The background consists of a large amount of diffuse 4-5 Hz theta and 2-3 Hz delta slowing admixed with beta activity seen over the left hemisphere, loss of faster frequencies and focal slowing over the right posterior quadrant. With stimulation, there is an increase in faster frequencies, with runs of focal 4-5 Hz theta slowing over the right frontpolar region. Normal sleep architecture was not seen.  Hyperventilation and photic stimulation were not performed.  There were no epileptiform discharges or electrographic seizures seen.    EKG lead showed sinus tachycardia.  Impression: This EEG is abnormal due to the presence of: 1. Moderate diffuse  slowing of the background 2. Additional focal slowing over the right posterior quadrant and right frontopolar region  Clinical Correlation of the above findings indicates diffuse cerebral dysfunction that is non-specific in etiology and can be seen with hypoxic/ischemic injury, toxic/metabolic encephalopathies, neurodegenerative disorders, or medication effect. Focal slowing indicates focal cerebral dysfunction suggestive of underlying structural or physiologic abnormality in this region. The run of focal slowing over the right frontopolar region did not clearly evolve and appears to be related to state change rather than electrographic seizure. The absence of epileptiform discharges does not rule out a clinical diagnosis of epilepsy.  Clinical correlation is advised.   Patrcia DollyKaren Aquino, M.D.

## 2016-10-21 NOTE — Progress Notes (Signed)
Patient in bed with eyes closed. No signs of pain or discomfort. Daughters at bedside.Will continue to monitor.

## 2016-10-21 NOTE — Progress Notes (Signed)
Temp of 102. NP Craige CottaKirby notified with order of bolus and blood culture.

## 2016-10-21 NOTE — Progress Notes (Signed)
[facility]:  RN visit  Received request from [place] for family interest in [place] with request for transfer today. Chart reviewed.  Met with and spoke to family of patient to confirm interest and explain services. Family agreeable to transfer today. CSW aware. Registration paper work completed. Dr. Orpah Melter to assume care per family request.  Please fax discharge summary to [phone]. RN please call report to [phone]. Please arrange transport for patient to arrive as soon as possible.   Thank you  [facility] Liaison  [phone]  All hospital liaisons are now on [place].

## 2016-10-21 NOTE — Progress Notes (Signed)
[phone][facility] Waiting on [facility] for transport, Family and friends are at her bedside. Will continue to monitor

## 2016-10-21 NOTE — Progress Notes (Signed)
PT Cancellation Note  Patient Details Name: Jeanne RammingRuth B Barker MRN: [id number] DOB: 12/17/1923   Cancelled Treatment:    Reason Eval/Treat Not Completed: Medical issues which prohibited therapy;Other (comment) (end-of-life order initiated). PT signing off, therapy no longer indicated.   Sebastian Acheanner Dianara Smullen, SPTA Office [phone]  Sebastian Acheanner Vinnie Gombert 10/21/2016, 3:29 PM

## 2016-10-21 NOTE — Telephone Encounter (Signed)
Caller name: Clayborne DanaMollie Woods Relationship to patient: daughter Can be reached: [phone]   Reason for call: [facility] She is requesting Dr. Drue NovelPaz review the chart and call her to let her know if he agrees with course of treatment.

## 2016-10-25 ENCOUNTER — Telehealth: Payer: Self-pay | Admitting: *Deleted

## 2016-10-26 LAB — CULTURE, BLOOD (ROUTINE X 2)
Culture: NO GROWTH
Culture: NO GROWTH
SPECIAL REQUESTS: ADEQUATE
Special Requests: ADEQUATE

## 2016-10-26 NOTE — Telephone Encounter (Signed)
Orders signed and mailed back to [facility] via envelope provided. Copy of forms sent for scanning.

## 2016-11-01 ENCOUNTER — Telehealth: Payer: Self-pay | Admitting: Neurology

## 2016-11-01 ENCOUNTER — Telehealth: Payer: Self-pay | Admitting: Internal Medicine

## 2016-11-01 NOTE — Telephone Encounter (Signed)
Patient is decease per daughter Jeanne ChildesFYI., Dr Roda ShuttersXu.

## 2016-11-01 NOTE — Telephone Encounter (Signed)
I am so sorry to hear that. Please send my condolence to her family. Thanks.   Marvel PlanJindong Jovin Fester, MD PhD Stroke Neurology 11/01/2016 4:43 PM

## 2016-11-01 NOTE — Telephone Encounter (Signed)
[facility] Relation to ZO:XWRUEAVWpt:daughter  Call back [phone]   Reason for call:  Daughter wanted to inform PCP patient has passed away.

## 2016-11-01 NOTE — Telephone Encounter (Addendum)
Per obituaries, Pt passed away on 02/08/17.

## 2016-11-01 NOTE — Telephone Encounter (Signed)
Patient's daughter called to advise patient passed away on Mar 24, 2017.

## 2016-11-02 NOTE — Telephone Encounter (Signed)
[facility] patient died on the 2717. Condolences provided, Jeanne Barker was a  extraordinary person. She was an example for most of us.

## 2016-11-09 NOTE — Telephone Encounter (Signed)
[facility]; forwarded to provider/SLS 07/17

## 2016-11-09 DEATH — deceased

## 2016-11-15 ENCOUNTER — Ambulatory Visit: Payer: Medicare Other | Admitting: Internal Medicine

## 2016-11-28 ENCOUNTER — Ambulatory Visit: Payer: Medicare Other | Admitting: Neurology

## 2016-12-08 ENCOUNTER — Telehealth: Payer: Self-pay | Admitting: *Deleted

## 2016-12-08 NOTE — Telephone Encounter (Signed)
Received Home Health Certification and Plan of Care; forwarded to provider/SLS 08/30

## 2016-12-08 NOTE — Telephone Encounter (Signed)
Home health called in to follow up. I did advise that PCP is currently out of the office until Monday. She said that it is okay for any physician to sign but can wait until Monday if need be.

## 2016-12-13 NOTE — Telephone Encounter (Signed)
[facility] provided. Copy of forms sent for scanning.

## 2016-12-20 ENCOUNTER — Ambulatory Visit: Payer: Medicare Other | Admitting: Podiatry

## 2016-12-26 ENCOUNTER — Ambulatory Visit: Payer: Medicare Other | Admitting: Internal Medicine

## 2017-02-03 ENCOUNTER — Ambulatory Visit: Payer: Medicare Other | Admitting: Neurology

## 2018-03-16 IMAGING — CT CT HEAD CODE STROKE
3 series · 15 of 47 positions shown, 18 images · non-contrast
Comparison: CT 08/06/2016

CLINICAL DATA: Code stroke.  Slurred speech and left-sided weakness

EXAM:
CT HEAD WITHOUT CONTRAST
TECHNIQUE: Contiguous axial images were obtained from the base of the skull
through the vertex without intravenous contrast.

[Series 3: head 5.0 st · axial · 0.44mm/px · z∈[+1318,+1463]mm · 9 of 35 slices shown, 12 images]
[im 3/35  brain]
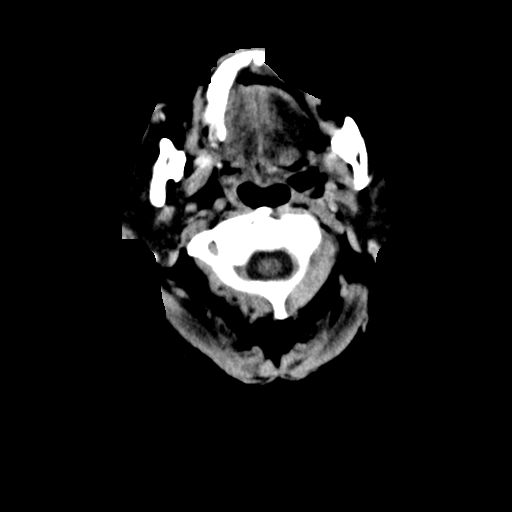
[im 3/35  bone]
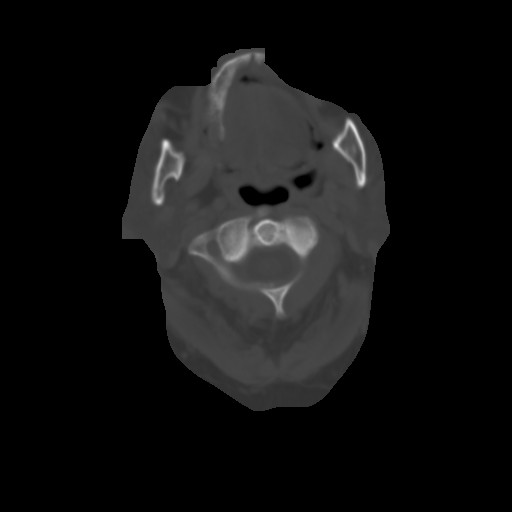
[im 6/35  brain]
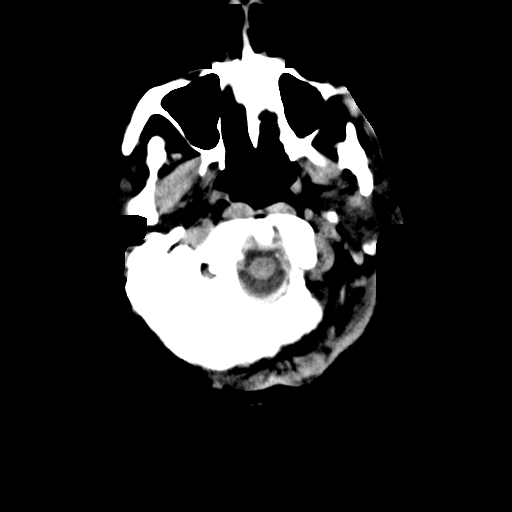
[im 10/35  brain]
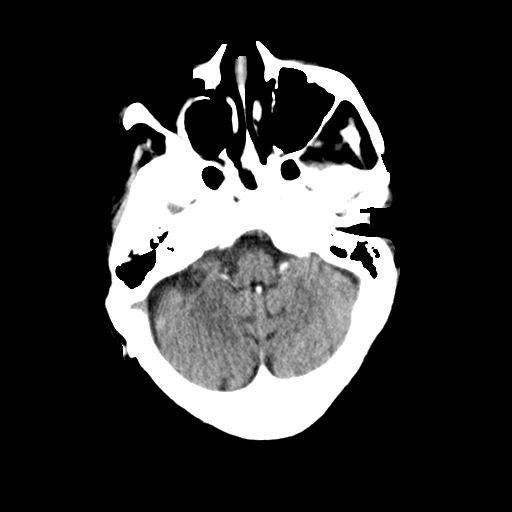
[im 13/35  brain]
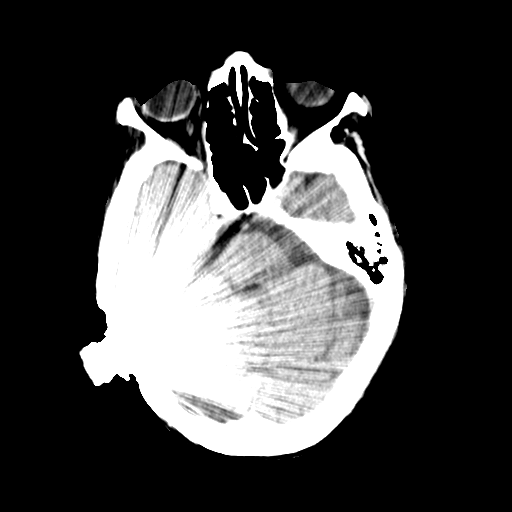
[im 18/35  brain]
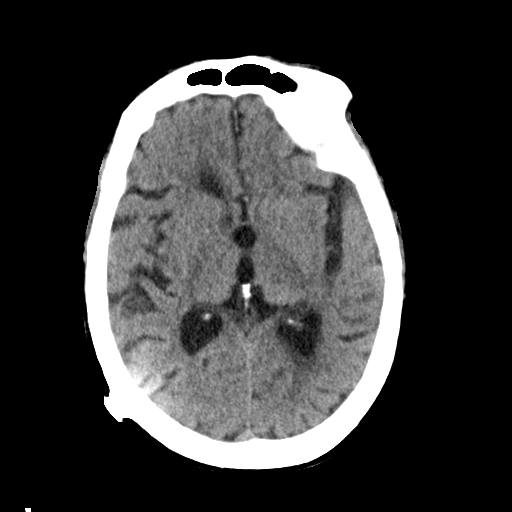
[im 18/35  bone]
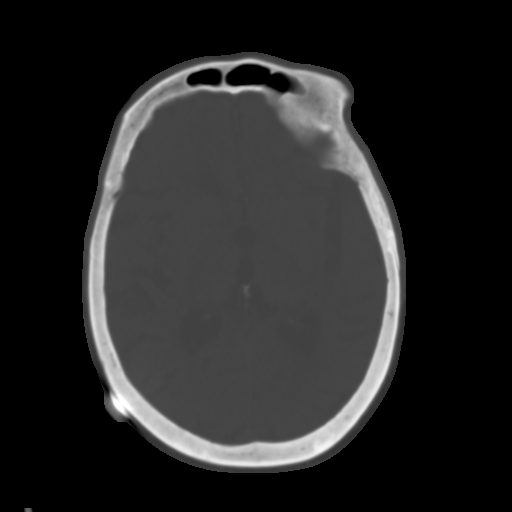
[im 22/35  brain]
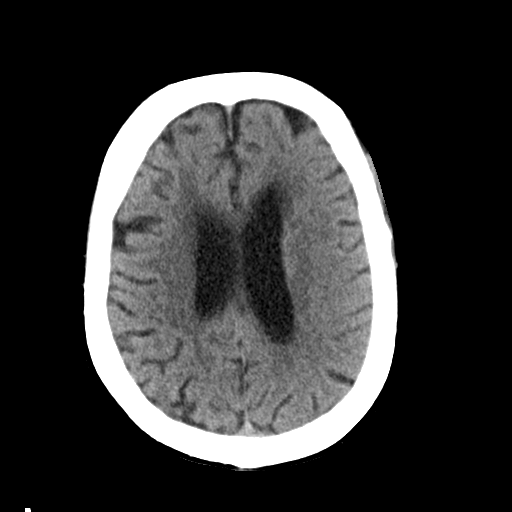
[im 25/35  brain]
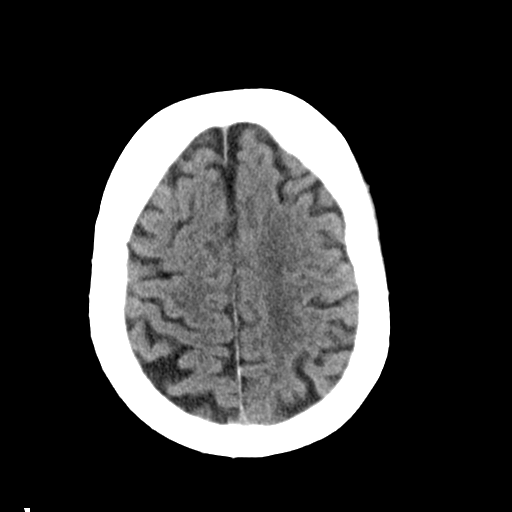
[im 29/35  brain]
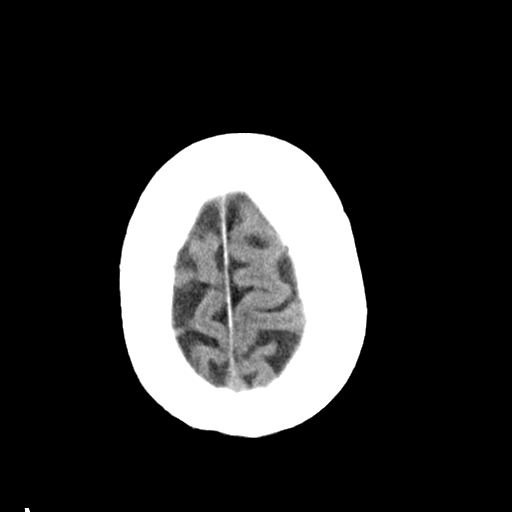
[im 32/35  brain]
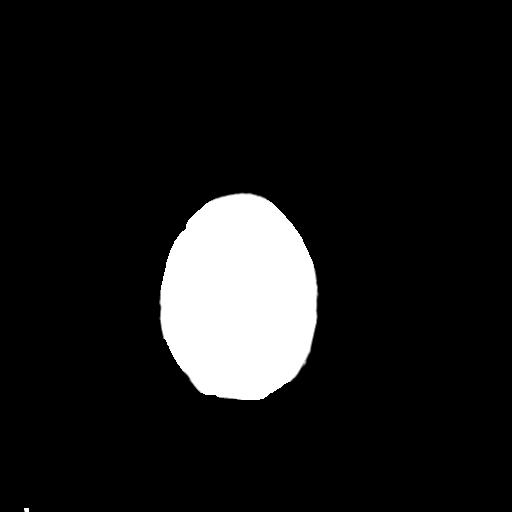
[im 32/35  bone]
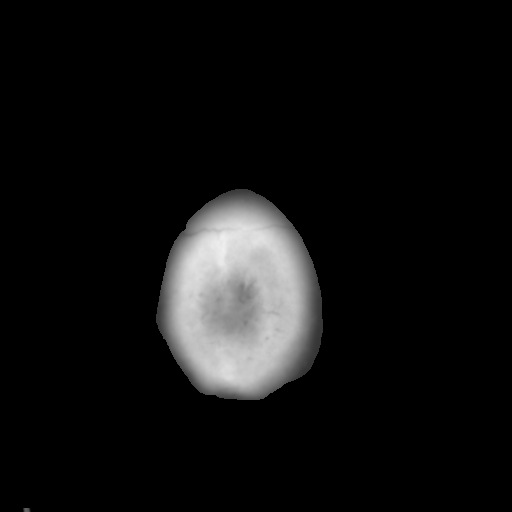

[Series 5: head 3.0 cor st · coronal · 0.33mm/px · 3 of 67 slices shown]
[im 23/67  brain]
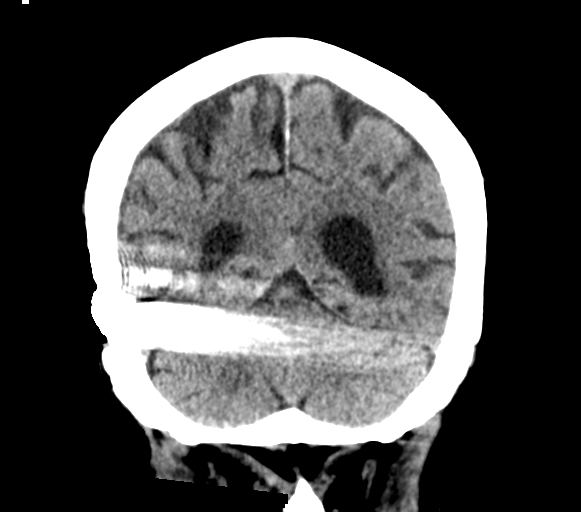
[im 30/67  brain]
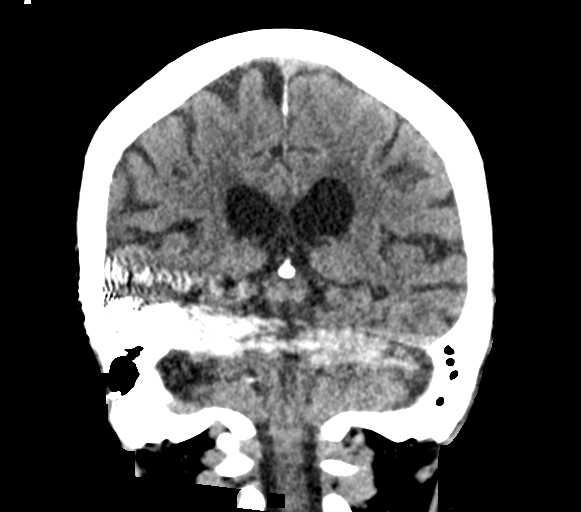
[im 37/67  brain]
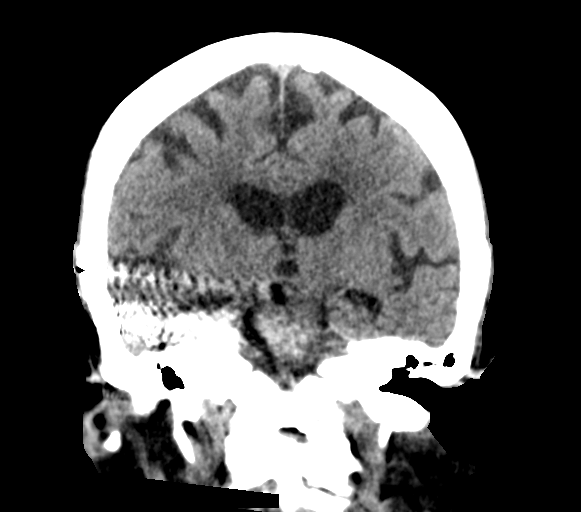

[Series 7: head 3.0 sag st · sagittal · 0.33mm/px · 3 of 61 slices shown]
[im 24/61  brain]
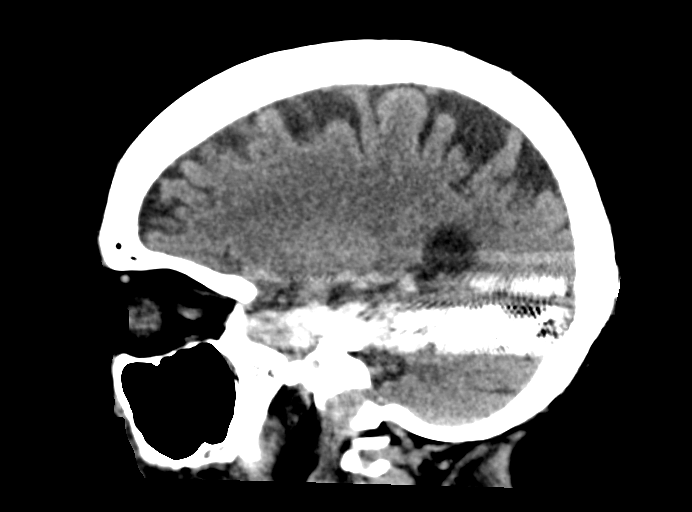
[im 32/61  brain]
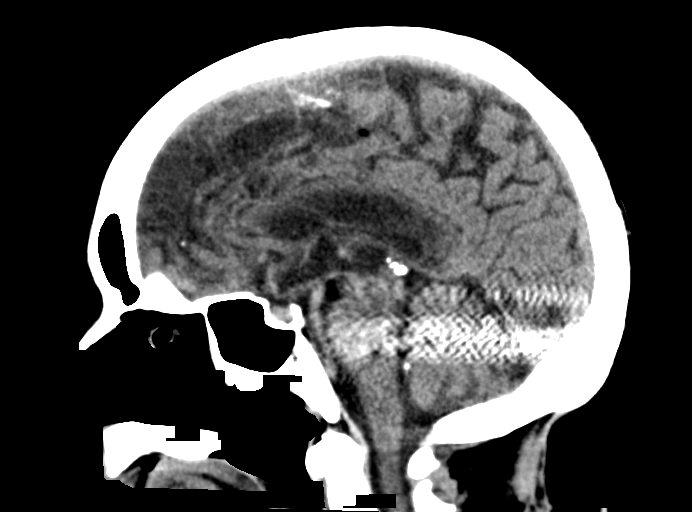
[im 40/61  brain]
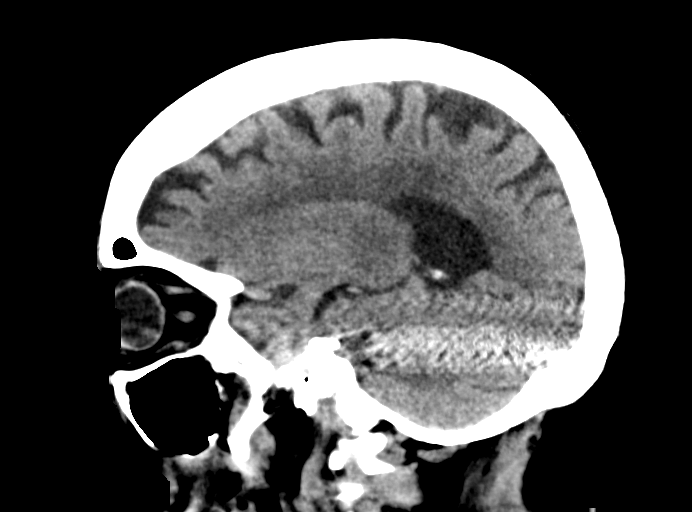

[15 of 47 positions shown; findings below may reference images not displayed]

FINDINGS: Brain: Streak artifact from a right-sided cochlear implant obscures
much of the posterior fossa and both temporal and occipital lobes.No
mass lesion, intraparenchymal hemorrhage or extra-axial collection.
No evidence of acute cortical infarct. There is mild for age
periventricular hypoattenuation compatible with chronic
microvascular disease.

Vascular: No hyperdense vessel. Bilateral atherosclerotic
calcification of the carotid arteries at skullbase.

Skull: Normal visualized skull base, calvarium and extracranial soft
tissues.

Sinuses/Orbits: No sinus fluid levels or advanced mucosal
thickening. No mastoid effusion. Normal orbits.

ASPECTS ([place]berta Stroke Program Early CT Score)

- Ganglionic level infarction (caudate, lentiform nuclei, internal
capsule, insula, M1-M3 cortex): 7

- Supraganglionic infarction (M4-M6 cortex): 3

Total score (0-10 with 10 being normal): 10
IMPRESSION: 1. No acute intracranial abnormality. Streak artifact from
right-sided cochlear implant limits assessment of the posterior
fossa and right temporal lobe.
2. ASPECTS is 10.
These results were called by telephone at the time of interpretation
on 09/30/2016 at [DATE] to Dr. PAULUS N CEEJAY , who verbally
acknowledged these results.

## 2018-08-21 IMAGING — US US RENAL
1 series · 14 of 25 positions shown · non-contrast
Comparison: Renal ultrasound 07/01/2005.

CLINICAL DATA: Acute renal failure. History of prior left
nephrectomy.

EXAM:
RENAL / URINARY TRACT ULTRASOUND COMPLETE

[Series 1: us renal · 0.23mm/px · 14 of 30 slices shown]
[im 1/30]
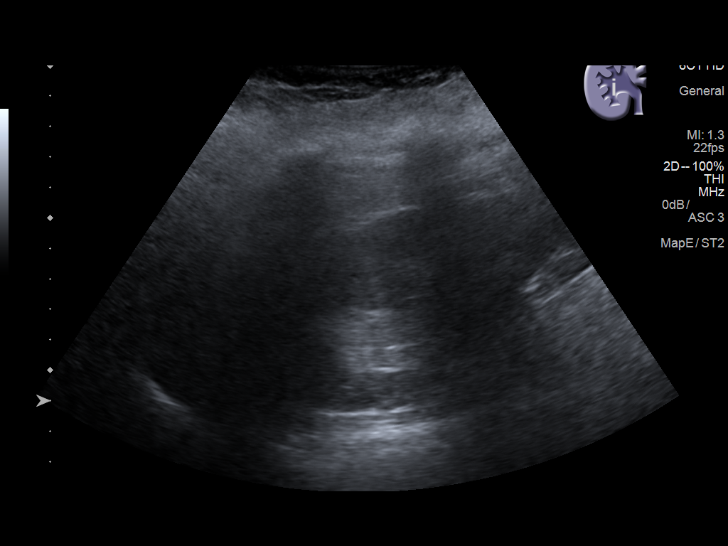
[im 3/30]
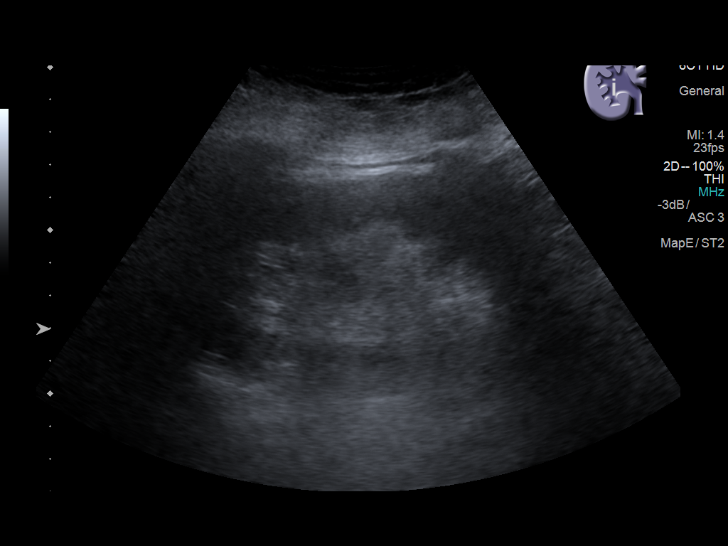
[im 5/30]
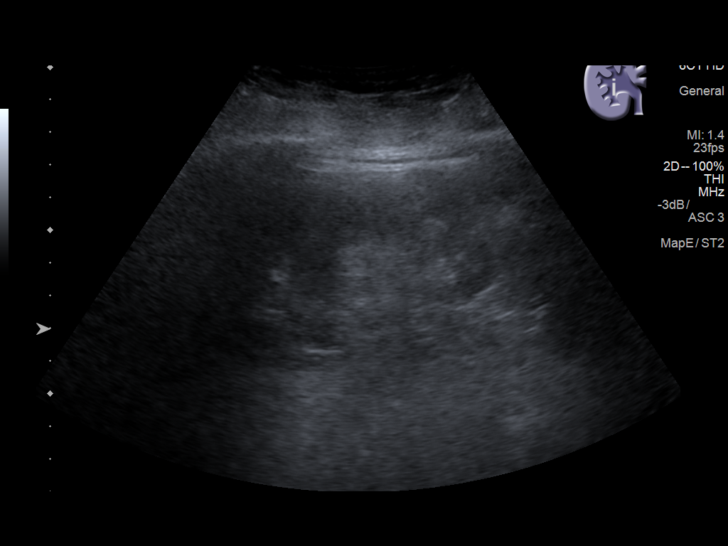
[im 8/30]
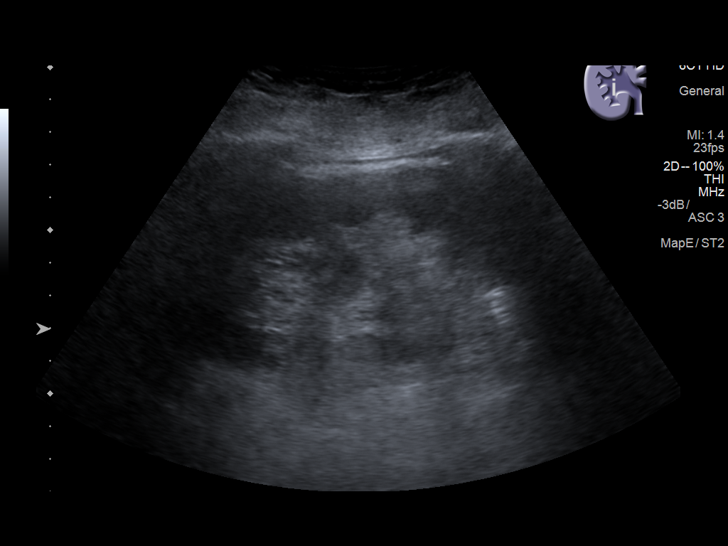
[im 10/30]
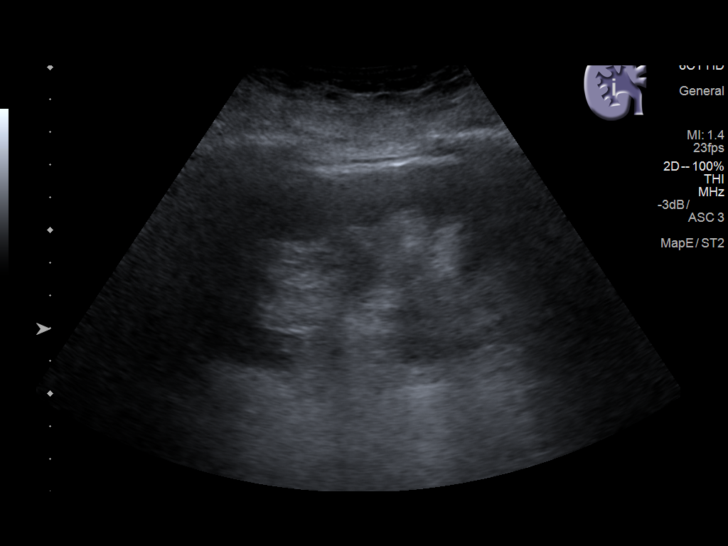
[im 11/30]
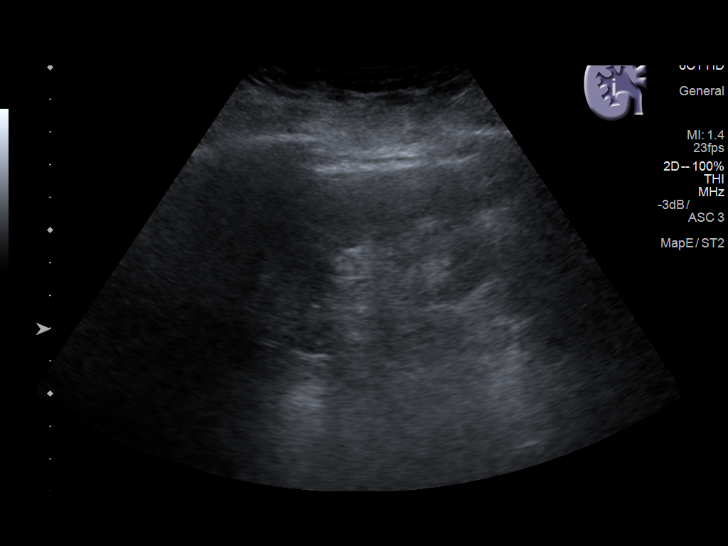
[im 14/30]
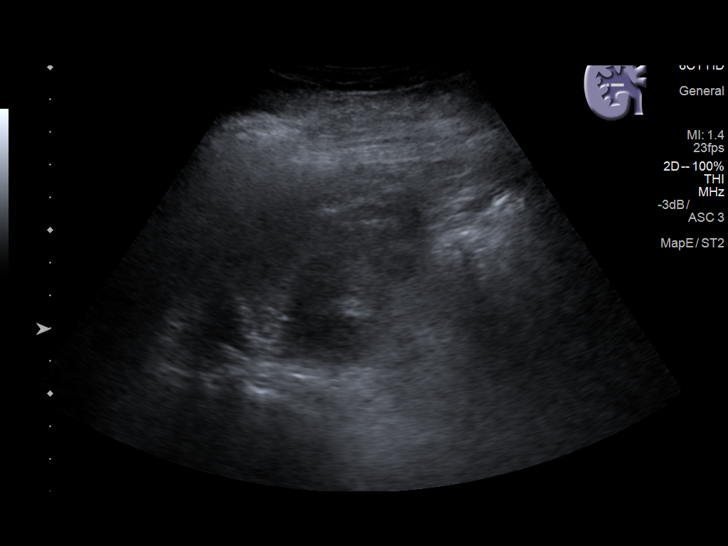
[im 16/30]
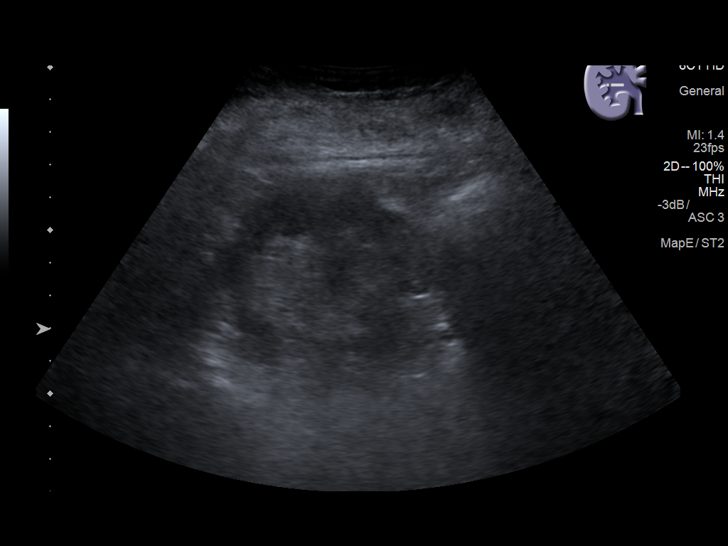
[im 19/30]
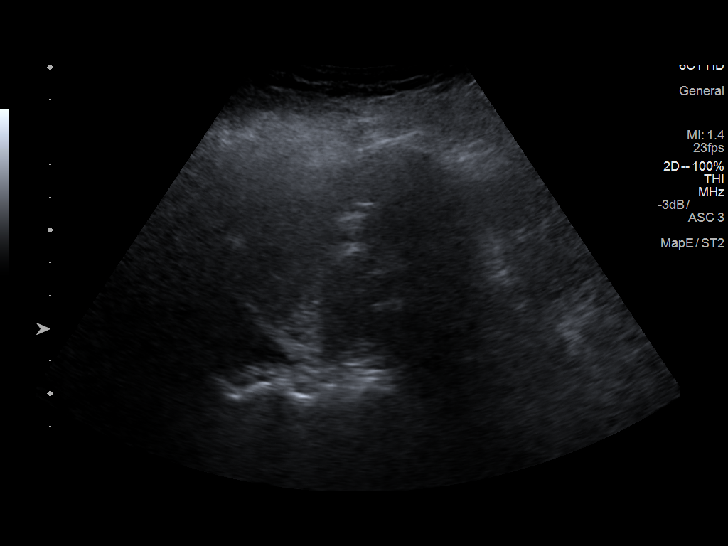
[im 20/30]
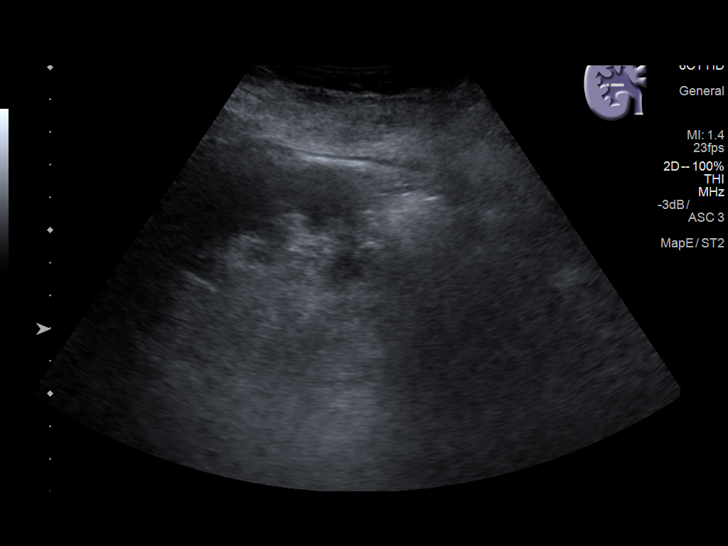
[im 22/30]
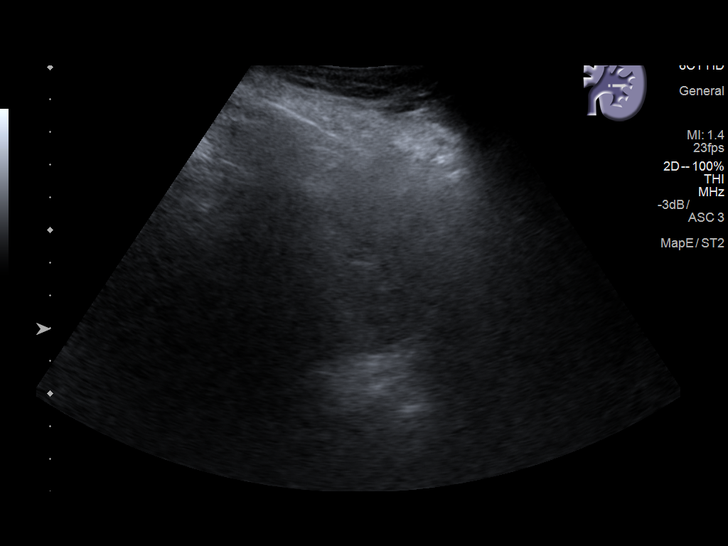
[im 25/30]
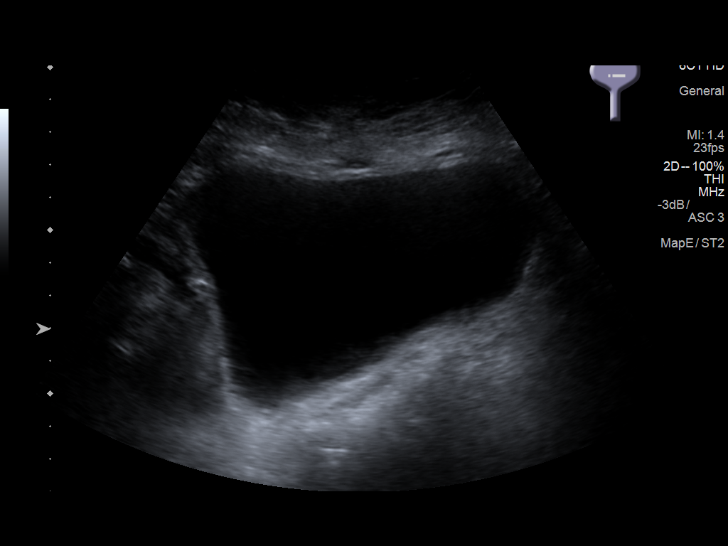
[im 27/30]
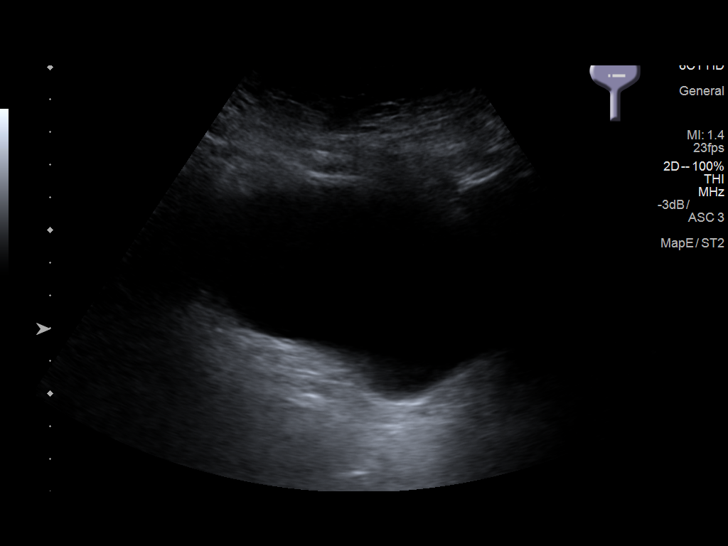
[im 30/30]
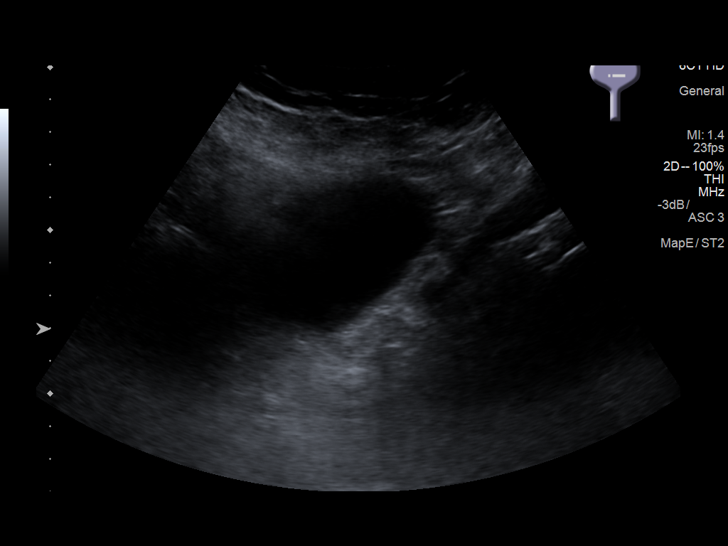

[14 of 25 positions shown; findings below may reference images not displayed]

FINDINGS: Right Kidney:

Length: 11.7 cm. Echogenicity within normal limits. No mass or
hydronephrosis visualized.

Left Kidney:

Removed.

Bladder:

Appears normal for degree of bladder distention.
IMPRESSION: Normal-appearing right kidney.

Status post left nephrectomy.
